# Patient Record
Sex: Female | Born: 1985
Health system: Southern US, Community
[De-identification: ages and names within clinical notes are randomized; demographics above are authoritative.]

## PROBLEM LIST (undated history)

## (undated) ENCOUNTER — Inpatient Hospital Stay (HOSPITAL_COMMUNITY): Payer: Self-pay

## (undated) DIAGNOSIS — R519 Headache, unspecified: Secondary | ICD-10-CM

## (undated) DIAGNOSIS — D649 Anemia, unspecified: Secondary | ICD-10-CM

## (undated) DIAGNOSIS — Z8759 Personal history of other complications of pregnancy, childbirth and the puerperium: Secondary | ICD-10-CM

## (undated) DIAGNOSIS — F32A Depression, unspecified: Secondary | ICD-10-CM

## (undated) DIAGNOSIS — E282 Polycystic ovarian syndrome: Secondary | ICD-10-CM

## (undated) DIAGNOSIS — F329 Major depressive disorder, single episode, unspecified: Secondary | ICD-10-CM

## (undated) DIAGNOSIS — Z8619 Personal history of other infectious and parasitic diseases: Secondary | ICD-10-CM

## (undated) DIAGNOSIS — N83209 Unspecified ovarian cyst, unspecified side: Secondary | ICD-10-CM

## (undated) DIAGNOSIS — Z9884 Bariatric surgery status: Secondary | ICD-10-CM

## (undated) DIAGNOSIS — Z973 Presence of spectacles and contact lenses: Secondary | ICD-10-CM

## (undated) DIAGNOSIS — F419 Anxiety disorder, unspecified: Secondary | ICD-10-CM

## (undated) HISTORY — DX: Polycystic ovarian syndrome: E28.2

## (undated) HISTORY — DX: Personal history of other infectious and parasitic diseases: Z86.19

---
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
## 2006-10-18 ENCOUNTER — Encounter: Payer: Self-pay | Admitting: Family Medicine

## 2006-10-18 ENCOUNTER — Ambulatory Visit: Payer: Self-pay | Admitting: Family Medicine

## 2006-10-18 LAB — CONVERTED CEMR LAB
Antibody Screen: NEGATIVE
Basophils Absolute: 0 10*3/uL (ref 0.0–0.1)
Basophils Relative: 0 % (ref 0–1)
Eosinophils Absolute: 0 10*3/uL (ref 0.0–0.7)
Eosinophils Relative: 1 % (ref 0–5)
HCT: 37.8 % (ref 36.0–46.0)
Hemoglobin: 12 g/dL (ref 12.0–15.0)
Hepatitis B Surface Ag: NEGATIVE
Lymphocytes Relative: 28 % (ref 12–46)
Lymphs Abs: 2.5 10*3/uL (ref 0.7–3.3)
MCHC: 31.7 g/dL (ref 30.0–36.0)
MCV: 83.8 fL (ref 78.0–100.0)
Monocytes Absolute: 0.5 10*3/uL (ref 0.2–0.7)
Monocytes Relative: 6 % (ref 3–11)
Neutro Abs: 5.7 10*3/uL (ref 1.7–7.7)
Neutrophils Relative %: 65 % (ref 43–77)
Platelets: 326 10*3/uL (ref 150–400)
RBC: 4.51 M/uL (ref 3.87–5.11)
RDW: 13.4 % (ref 11.5–14.0)
Rh Type: POSITIVE
Rubella: 90.3 intl units/mL — ABNORMAL HIGH
WBC: 8.8 10*3/uL (ref 4.0–10.5)

## 2006-10-21 ENCOUNTER — Inpatient Hospital Stay (HOSPITAL_COMMUNITY): Admission: AD | Admit: 2006-10-21 | Discharge: 2006-10-21 | Payer: Self-pay | Admitting: Gynecology

## 2006-10-23 ENCOUNTER — Inpatient Hospital Stay (HOSPITAL_COMMUNITY): Admission: AD | Admit: 2006-10-23 | Discharge: 2006-10-23 | Payer: Self-pay | Admitting: Obstetrics and Gynecology

## 2006-10-31 ENCOUNTER — Inpatient Hospital Stay (HOSPITAL_COMMUNITY): Admission: AD | Admit: 2006-10-31 | Discharge: 2006-10-31 | Payer: Self-pay | Admitting: Obstetrics & Gynecology

## 2006-11-01 ENCOUNTER — Inpatient Hospital Stay (HOSPITAL_COMMUNITY): Admission: AD | Admit: 2006-11-01 | Discharge: 2006-11-01 | Payer: Self-pay | Admitting: Obstetrics & Gynecology

## 2006-12-12 ENCOUNTER — Ambulatory Visit: Payer: Self-pay | Admitting: Obstetrics & Gynecology

## 2007-05-01 ENCOUNTER — Encounter: Payer: Self-pay | Admitting: Family Medicine

## 2007-05-16 ENCOUNTER — Emergency Department (HOSPITAL_COMMUNITY): Admission: EM | Admit: 2007-05-16 | Discharge: 2007-05-16 | Payer: Self-pay | Admitting: Emergency Medicine

## 2007-09-20 ENCOUNTER — Encounter (INDEPENDENT_AMBULATORY_CARE_PROVIDER_SITE_OTHER): Payer: Self-pay | Admitting: *Deleted

## 2007-09-20 ENCOUNTER — Ambulatory Visit: Payer: Self-pay | Admitting: Family Medicine

## 2007-09-20 ENCOUNTER — Other Ambulatory Visit: Admission: RE | Admit: 2007-09-20 | Discharge: 2007-09-20 | Payer: Self-pay | Admitting: *Deleted

## 2007-09-20 LAB — CONVERTED CEMR LAB
Beta hcg, urine, semiquantitative: NEGATIVE
Bilirubin Urine: NEGATIVE
Blood in Urine, dipstick: NEGATIVE
Glucose, Urine, Semiquant: NEGATIVE
Ketones, urine, test strip: NEGATIVE
Nitrite: NEGATIVE
Pap Smear: NORMAL
Protein, U semiquant: NEGATIVE
Specific Gravity, Urine: 1.02
Urobilinogen, UA: 0.2
WBC Urine, dipstick: NEGATIVE
pH: 8

## 2007-09-26 ENCOUNTER — Encounter (INDEPENDENT_AMBULATORY_CARE_PROVIDER_SITE_OTHER): Payer: Self-pay | Admitting: *Deleted

## 2007-09-26 LAB — CONVERTED CEMR LAB
Chlamydia, DNA Probe: NEGATIVE
FSH: 7.6 milliintl units/mL
GC Probe Amp, Genital: NEGATIVE
Prolactin: 12.1 ng/mL
TSH: 3.051 microintl units/mL (ref 0.350–5.50)
Testosterone: 52.34 ng/dL (ref 10–70)

## 2007-10-03 ENCOUNTER — Ambulatory Visit: Payer: Self-pay | Admitting: Family Medicine

## 2007-10-03 ENCOUNTER — Telehealth: Payer: Self-pay | Admitting: *Deleted

## 2007-10-03 LAB — CONVERTED CEMR LAB
Bilirubin Urine: NEGATIVE
Blood in Urine, dipstick: NEGATIVE
Glucose, Urine, Semiquant: NEGATIVE
Ketones, urine, test strip: NEGATIVE
Nitrite: NEGATIVE
Protein, U semiquant: NEGATIVE
Specific Gravity, Urine: 1.02
Urobilinogen, UA: 0.2
Whiff Test: NEGATIVE
pH: 7

## 2007-11-29 ENCOUNTER — Ambulatory Visit: Payer: Self-pay | Admitting: Family Medicine

## 2007-11-29 ENCOUNTER — Encounter (INDEPENDENT_AMBULATORY_CARE_PROVIDER_SITE_OTHER): Payer: Self-pay | Admitting: *Deleted

## 2007-11-29 LAB — CONVERTED CEMR LAB
Beta hcg, urine, semiquantitative: NEGATIVE
Bilirubin Urine: NEGATIVE
Blood in Urine, dipstick: NEGATIVE
Chlamydia, DNA Probe: NEGATIVE
GC Probe Amp, Genital: NEGATIVE
Glucose, Urine, Semiquant: NEGATIVE
Ketones, urine, test strip: NEGATIVE
Nitrite: NEGATIVE
Protein, U semiquant: NEGATIVE
Specific Gravity, Urine: 1.02
Urobilinogen, UA: 1
WBC Urine, dipstick: NEGATIVE
Whiff Test: POSITIVE
pH: 6.5

## 2007-12-02 ENCOUNTER — Encounter (INDEPENDENT_AMBULATORY_CARE_PROVIDER_SITE_OTHER): Payer: Self-pay | Admitting: *Deleted

## 2008-01-12 ENCOUNTER — Emergency Department (HOSPITAL_COMMUNITY): Admission: EM | Admit: 2008-01-12 | Discharge: 2008-01-12 | Payer: Self-pay | Admitting: Family Medicine

## 2008-02-03 ENCOUNTER — Emergency Department (HOSPITAL_COMMUNITY): Admission: EM | Admit: 2008-02-03 | Discharge: 2008-02-03 | Payer: Self-pay | Admitting: Emergency Medicine

## 2008-02-04 ENCOUNTER — Ambulatory Visit: Payer: Self-pay | Admitting: Family Medicine

## 2008-02-04 ENCOUNTER — Encounter (INDEPENDENT_AMBULATORY_CARE_PROVIDER_SITE_OTHER): Payer: Self-pay | Admitting: *Deleted

## 2008-02-04 LAB — CONVERTED CEMR LAB
ALT: 11 units/L (ref 0–35)
AST: 15 units/L (ref 0–37)
Albumin: 4.3 g/dL (ref 3.5–5.2)
Alkaline Phosphatase: 55 units/L (ref 39–117)
BUN: 10 mg/dL (ref 6–23)
Beta hcg, urine, semiquantitative: NEGATIVE
Bilirubin Urine: NEGATIVE
Blood in Urine, dipstick: NEGATIVE
CO2: 24 meq/L (ref 19–32)
Calcium: 9.6 mg/dL (ref 8.4–10.5)
Chlamydia, DNA Probe: NEGATIVE
Chloride: 104 meq/L (ref 96–112)
Creatinine, Ser: 0.9 mg/dL (ref 0.40–1.20)
GC Probe Amp, Genital: NEGATIVE
Glucose, Bld: 82 mg/dL (ref 70–99)
Glucose, Urine, Semiquant: NEGATIVE
HCT: 41.5 % (ref 36.0–46.0)
Hemoglobin: 12.5 g/dL (ref 12.0–15.0)
Ketones, urine, test strip: NEGATIVE
MCHC: 30.1 g/dL (ref 30.0–36.0)
MCV: 86.6 fL (ref 78.0–100.0)
Nitrite: NEGATIVE
Platelets: 306 10*3/uL (ref 150–400)
Potassium: 4.3 meq/L (ref 3.5–5.3)
RBC: 4.79 M/uL (ref 3.87–5.11)
RDW: 13.3 % (ref 11.5–15.5)
Sodium: 143 meq/L (ref 135–145)
Specific Gravity, Urine: 1.02
Total Bilirubin: 0.5 mg/dL (ref 0.3–1.2)
Total Protein: 7.8 g/dL (ref 6.0–8.3)
Urobilinogen, UA: 0.2
WBC Urine, dipstick: NEGATIVE
WBC: 7.9 10*3/uL (ref 4.0–10.5)
pH: 7

## 2008-02-05 ENCOUNTER — Telehealth (INDEPENDENT_AMBULATORY_CARE_PROVIDER_SITE_OTHER): Payer: Self-pay | Admitting: *Deleted

## 2008-02-06 ENCOUNTER — Encounter: Payer: Self-pay | Admitting: Family Medicine

## 2008-02-06 ENCOUNTER — Ambulatory Visit: Payer: Self-pay | Admitting: Family Medicine

## 2008-02-06 LAB — CONVERTED CEMR LAB: Tissue Transglutaminase Ab, IgA: 0.6 units (ref <7)

## 2008-02-07 ENCOUNTER — Emergency Department (HOSPITAL_COMMUNITY): Admission: EM | Admit: 2008-02-07 | Discharge: 2008-02-07 | Payer: Self-pay | Admitting: Emergency Medicine

## 2008-02-07 ENCOUNTER — Encounter: Admission: RE | Admit: 2008-02-07 | Discharge: 2008-02-07 | Payer: Self-pay | Admitting: Family Medicine

## 2008-02-10 ENCOUNTER — Telehealth: Payer: Self-pay | Admitting: *Deleted

## 2008-03-20 ENCOUNTER — Ambulatory Visit: Payer: Self-pay | Admitting: Family Medicine

## 2008-03-20 LAB — CONVERTED CEMR LAB
Beta hcg, urine, semiquantitative: NEGATIVE
Bilirubin Urine: NEGATIVE
Blood in Urine, dipstick: NEGATIVE
Glucose, Urine, Semiquant: NEGATIVE
Ketones, urine, test strip: NEGATIVE
Nitrite: NEGATIVE
Protein, U semiquant: NEGATIVE
Specific Gravity, Urine: 1.025
Urobilinogen, UA: 1
WBC Urine, dipstick: NEGATIVE
pH: 7

## 2008-05-12 ENCOUNTER — Emergency Department (HOSPITAL_COMMUNITY): Admission: EM | Admit: 2008-05-12 | Discharge: 2008-05-12 | Payer: Self-pay | Admitting: Family Medicine

## 2008-07-28 ENCOUNTER — Emergency Department (HOSPITAL_COMMUNITY): Admission: EM | Admit: 2008-07-28 | Discharge: 2008-07-28 | Payer: Self-pay | Admitting: Family Medicine

## 2008-11-04 ENCOUNTER — Ambulatory Visit: Payer: Self-pay | Admitting: Family Medicine

## 2008-11-04 LAB — CONVERTED CEMR LAB: Beta hcg, urine, semiquantitative: NEGATIVE

## 2009-01-07 ENCOUNTER — Telehealth: Payer: Self-pay | Admitting: Family Medicine

## 2009-01-08 ENCOUNTER — Ambulatory Visit: Payer: Self-pay | Admitting: Family Medicine

## 2009-01-08 ENCOUNTER — Encounter: Payer: Self-pay | Admitting: Family Medicine

## 2009-01-08 LAB — CONVERTED CEMR LAB
Beta hcg, urine, semiquantitative: NEGATIVE
Hemoglobin: 12.5 g/dL

## 2009-01-11 ENCOUNTER — Encounter: Payer: Self-pay | Admitting: *Deleted

## 2009-01-11 LAB — CONVERTED CEMR LAB: TSH: 2.06 microintl units/mL (ref 0.350–4.500)

## 2009-05-29 ENCOUNTER — Encounter (INDEPENDENT_AMBULATORY_CARE_PROVIDER_SITE_OTHER): Payer: Self-pay | Admitting: *Deleted

## 2009-05-29 DIAGNOSIS — F17200 Nicotine dependence, unspecified, uncomplicated: Secondary | ICD-10-CM | POA: Insufficient documentation

## 2009-09-24 ENCOUNTER — Encounter: Payer: Self-pay | Admitting: Family Medicine

## 2009-09-24 ENCOUNTER — Ambulatory Visit: Payer: Self-pay | Admitting: Family Medicine

## 2009-09-24 DIAGNOSIS — E282 Polycystic ovarian syndrome: Secondary | ICD-10-CM | POA: Insufficient documentation

## 2009-09-24 LAB — CONVERTED CEMR LAB
ALT: 13 units/L (ref 0–35)
AST: 17 units/L (ref 0–37)
Albumin: 4.1 g/dL (ref 3.5–5.2)
Alkaline Phosphatase: 64 units/L (ref 39–117)
BUN: 11 mg/dL (ref 6–23)
Beta hcg, urine, semiquantitative: NEGATIVE
CO2: 22 meq/L (ref 19–32)
Calcium: 8.8 mg/dL (ref 8.4–10.5)
Chloride: 104 meq/L (ref 96–112)
Cholesterol: 150 mg/dL (ref 0–200)
Creatinine, Ser: 0.88 mg/dL (ref 0.40–1.20)
Glucose, Bld: 91 mg/dL (ref 70–99)
HDL: 45 mg/dL (ref >39)
LDL Cholesterol: 94 mg/dL (ref 0–99)
Potassium: 4.1 meq/L (ref 3.5–5.3)
Sodium: 139 meq/L (ref 135–145)
Total Bilirubin: 0.5 mg/dL (ref 0.3–1.2)
Total CHOL/HDL Ratio: 3.3
Total Protein: 7.3 g/dL (ref 6.0–8.3)
Triglycerides: 54 mg/dL (ref <150)
VLDL: 11 mg/dL (ref 0–40)

## 2009-09-28 ENCOUNTER — Encounter: Payer: Self-pay | Admitting: Family Medicine

## 2009-11-18 ENCOUNTER — Ambulatory Visit: Payer: Self-pay | Admitting: Family Medicine

## 2009-11-18 LAB — CONVERTED CEMR LAB: Pap Smear: NEGATIVE

## 2009-11-25 ENCOUNTER — Encounter: Payer: Self-pay | Admitting: Family Medicine

## 2009-12-23 ENCOUNTER — Ambulatory Visit: Payer: Self-pay | Admitting: Family Medicine

## 2009-12-23 LAB — CONVERTED CEMR LAB: Beta hcg, urine, semiquantitative: NEGATIVE

## 2009-12-27 ENCOUNTER — Encounter: Payer: Self-pay | Admitting: Family Medicine

## 2010-07-14 ENCOUNTER — Encounter: Payer: Self-pay | Admitting: Family Medicine

## 2010-09-30 ENCOUNTER — Ambulatory Visit: Admission: RE | Admit: 2010-09-30 | Discharge: 2010-09-30 | Payer: Self-pay | Source: Home / Self Care

## 2010-09-30 DIAGNOSIS — J309 Allergic rhinitis, unspecified: Secondary | ICD-10-CM | POA: Insufficient documentation

## 2010-09-30 DIAGNOSIS — N926 Irregular menstruation, unspecified: Secondary | ICD-10-CM | POA: Insufficient documentation

## 2010-09-30 LAB — CONVERTED CEMR LAB: Beta hcg, urine, semiquantitative: NEGATIVE

## 2010-10-11 NOTE — Progress Notes (Signed)
Summary: Triage  Phone Note Call from Patient Call back at Home Phone [phone]   Reason for Call: Talk to Nurse Summary of Call: Pt needs to be seen to follow up from hospital visit, but wants to discuss with a nurse. Initial call taken by: Haydee Salter,  February 10, 2008 12:03 PM  Follow-up for Phone Call        left message Follow-up by: Golden Circle RN,  February 10, 2008 12:05 PM  Additional Follow-up for Phone Call Additional follow up Details #1::        hosp for stomach pain. CAT scan was normal. tired of multiple visits. wants referral. appt made with pcp tomorrow Additional Follow-up by: Golden Circle RN,  February 10, 2008 1:58 PM

## 2010-10-11 NOTE — Progress Notes (Signed)
Summary: Triage  Phone Note Call from Patient Call back at [phone]   Summary of Call: Pt is concerned about her vaginal area being swollen. Initial call taken by: Haydee Salter,  October 03, 2007 10:09 AM  Follow-up for Phone Call        started yesterday. swollen, red, itchy, "feels bruised"  Denies recent sex. appt made for today  Follow-up by: Golden Circle RN,  October 03, 2007 10:13 AM

## 2010-10-11 NOTE — Assessment & Plan Note (Signed)
Summary: cpe,df   Vital Signs:  Patient profile:   25 year old female Height:      66.5 inches Weight:      270 pounds BMI:     43.08 Temp:     98.2 degrees F oral Pulse rate:   79 / minute BP sitting:   126 / 79  (left arm) Cuff size:   large  Vitals Entered By: Tessie Fass CMA (November 18, 2009 4:08 PM) CC: complete physical with pap Is Patient Diabetic? No Pain Assessment Patient in pain? no        Primary Care Provider:  Ardeen Garland  MD  CC:  complete physical with pap.  History of Present Illness: Crystal [name] is here for complete physical today.  ONly complaint is PNVs make her nauseated.  AT last visit started on Metformin for PCOS and desire to get pregnant.  Was having irregular periods.  Wanted to go off OCPs since she wanted to become pregnant.  Toelrating the metformin well. LMP 10-16-09.  Has not yet started this month.  No other complaints. Denies vaginal discharge, abdominal pain, headache, difficulty breathing, chest pain.   Habits & Providers  Alcohol-Tobacco-Diet     Tobacco Status: quit  Allergies: No Known Drug Allergies  Past History:  Past Medical History: Last updated: 11/08/2006 RX for plan B given  Family History: Last updated: 02/06/2008 CAD, HTN, DM, Asthma No hx of gastric, colon, or gyn cancers  Social History: Last updated: 11/08/2006 parents are not in the picture; pt lives with her pastor since 6/02; sister with a child who lives with grandmother; grandmother is only family; enjoys hanging out with friends; no cigs, etoh, drug use; wil be freshman at [employer]; sexually active; Father lives in [place]- lived with until last year.  Mother not in the picture.  Social History: Smoking Status:  quit  Physical Exam  General:  obese, alert, NAD vitals reviewed.  Eyes:  conjunctiva clear, no injection Ears:  bilateral TMs normal Mouth:  oropharynx clear and moist Neck:  No deformities, masses, or tenderness noted. Lungs:  Normal  respiratory effort, chest expands symmetrically. Lungs are clear to auscultation, no crackles or wheezes. Heart:  Normal rate and regular rhythm. S1 and S2 normal without gallop, murmur, click, rub or other extra sounds. Abdomen:  Bowel sounds positive,abdomen soft and non-tender without masses, organomegaly or hernias noted. Genitalia:  Normal introitus for age, no external lesions, no vaginal discharge, mucosa pink and moist, no vaginal or cervical lesions, no vaginal atrophy, no friaility or hemorrhage, normal uterus size and position, unable to palpate adnexae secondary to habitus Pulses:  2+ radial and dp pulses Extremities:  no edema Skin:  no lesions Cervical Nodes:  No lymphadenopathy noted   Impression & Recommendations:  Problem # 1:  ROUTINE GYNECOLOGICAL EXAMINATION (ICD-V72.31) Assessment New  Pap obtained.  Exam otherwise normal.  Return in 1 year  Orders: [facility] - Est  18-39 yrs ([address])  Problem # 2:  POLYCYSTIC OVARIAN DISEASE (ICD-256.4) Assessment: Unchanged Toelrating metformin.   Complete Medication List: 1)  Metformin Hcl 500 Mg Tabs (Metformin hcl) .Marland Kitchen.. 1 tab by mouth with dinner for 5 days, then 1 tab with lunch and dinner for 5 days and then 1 tab with three times a day with meals 2)  Prenatal Vitamins 0.8 Mg Tabs (Prenatal multivit-min-fe-fa) .Marland Kitchen.. 1 tab by mouth daily  Other Orders: Pap Smear-[facility] ([id number])  Patient Instructions: 1)  everything looked great on your exam today.  I  will send you a letter with the results of your pap smear when it comes back. 2)  Try the flintstones vitamins instead of the prenatals - you can take 2 daily for the equivalent of a prenatal vitamin.  3)  All the labwork at your last visit looked excellent. 4)  Please return in 1 year for your next pap smear.  5)  If when you have gone 6-8 weeks without a period, take a pregnancy test.  If the metformin doesn't end up regulating your periods after another 3-4 months or you become  unable to tolerate it or you want to go back on birth control, then please come back in.  If you go 6 months without a period and you are not pregnant, please return.

## 2010-10-11 NOTE — Assessment & Plan Note (Signed)
Summary: prolonged bleeding   Vital Signs:  Patient profile:   25 year old female Weight:      273.9 pounds Temp:     98 degrees F oral Pulse rate:   61 / minute Pulse rhythm:   regular BP sitting:   125 / 79  (left arm) Cuff size:   large  Vitals Entered By: Loralee Pacas CMA (December 23, 2009 10:56 AM)  Primary Care Provider:  Ardeen Garland  MD   History of Present Illness: 25 yo with h/o PCOS reports LMP started 11 days ago.  Has days that are like a regular menses, changes pad q 3-4 hours and days with no bleeding except when she uses the bathroom.  No menses in March, but had one in Feb.  Denies SOB and dizziness. Trying to get pregnant.  On metformin - would like to minimize the the number of times she takes it in a day .  She is exercising and trying to lose weight.  Has not started her multivitamins, but knows children's vitamins are an option.    Allergies: No Known Drug Allergies  Review of Systems       seee HPI  Physical Exam  General:  Obese,in no acute distress; alert,appropriate and cooperative throughout examination Genitalia:  Normal introitus for age, no external lesions, no vaginal discharge, mucosa pink and moist, no vaginal or cervical lesions, no vaginal atrophy.  +scant amount of blood in vault.,   Impression & Recommendations:  Problem # 1:  POLYCYSTIC OVARIAN DISEASE (ICD-256.4)  DUB likely due to PCOS. Upreg neg.  Will try a short course of progesterone to stop bleeding. May need a longer course, but pt currently trying to get pregnant, so we will try short course.  Also may change metforming to daily dosing of XR med since she is doing well at her current dose.  Advised weight loss with nutrition and exercise.  Pt to follow up with any concerns.  Orders: [facility]- Est Level  3 ([address])  Complete Medication List: 1)  Medroxyprogesterone Acetate 10 Mg Tabs (Medroxyprogesterone acetate) .... Take 1 by mouth daily for 5 days. 2)  Glumetza 1000 Mg Xr24h-tab  (Metformin hcl) .... Take 2 at night with dinner.  Other Orders: U Preg-[facility] ([address])  Patient Instructions: 1)  You can take the metformin at night.  Take 2 pills with dinner. 2)  You may have more bleeding again after you stop the progesterone, but it shouldn't last as long. 3)  If you don't want to take the children's multivitamin, you can take 400 micrograms of folic acid every day. 4)  Good luck with the weight loss.  I think it will make a big difference! Prescriptions: GLUMETZA 1000 MG XR24H-TAB (METFORMIN HCL) Take 2 at night with dinner.  #60 x 3   Entered and Authorized by:   Lasundra Hascall [place] MD   Signed by:   Carvel Huskins [place] MD on 12/23/2009   Method used:   Electronically to        [facility] (retail)       [address]       [place], [place]  [address]       Ph: [phone]       Fax: [phone]   RxID:   [id number] MEDROXYPROGESTERONE ACETATE 10 MG TABS (MEDROXYPROGESTERONE ACETATE) Take 1 by mouth daily for 5 days.  #5 x 0   Entered and Authorized by:  Jeorgia Helming [place] MD   Signed by:   Trusten Hume [place] MD on 12/23/2009   Method used:   Electronically to        [facility] (retail)       [address]       [place], [place]  [address]       Ph: [phone]       Fax: [phone]   RxID:   [id number]   Laboratory Results   Urine Tests  Date/Time Received: December 23, 2009 11:00 AM  Date/Time Reported: December 23, 2009 11:06 AM     Urine HCG: negative Comments: ...........test performed by...........Marland KitchenTerese Door, CMA

## 2010-10-11 NOTE — Letter (Signed)
Summary: Generic Letter  [facility]  [address]   [place], [place] [address]   Phone: [phone]  Fax: [phone]    01/11/2009  [facility] [address] [place], [place]  [address]  Dear Ms. Ivette Loyal,   Your thyroid test was normal.        Sincerely,   Arlyss Repress CMA,

## 2010-10-11 NOTE — Letter (Signed)
Summary: Generic Letter  [facility]  [address]   [place], [place] [address]   Phone: [phone]  Fax: [phone]    11/25/2009  [facility] [address] [place], [place]  [address]  Dear [name],  I am happy to inform you that your recent pap smear was normal.  If you have any questions, please call our office.          Sincerely,   Ardeen Garland  MD  Appended Document: Generic Letter mailed.

## 2010-10-11 NOTE — Assessment & Plan Note (Signed)
Summary: menstrual bleeding since 2/10   Vital Signs:  Patient profile:   25 year old female Height:      66.5 inches Weight:      275 pounds BMI:     43.88 Pulse rate:   72 / minute BP sitting:   123 / 79  (left arm)  Vitals Entered By: Arlyss Repress CMA, (January 08, 2009 9:55 AM) CC: menses since february   History of Present Illness: 25 yo F with suspected PCOS presents with menses since 2/9.  Patient had irregular periods leading up to this time.  She then started spotting on 2/9 and it has been intermittently heavier and lighter but has not completely stopped.  Denies dizziness, shortness of breath, other complaints related to anemia.  No FH thyroid disease.  No nipple discharge.  Last sexual intercourse 2 weeks ago.  She has not been taking birth control pills - she did not pick them up and stated cost was not a reason for not picking them up.  + facial hair.  No DM - last fasting glucose normal 1 year ago.  Not trying to get pregnant.  Allergies (verified): No Known Drug Allergies  Physical Exam  General:  obese, alert, well-hydrated in NAD, very pleasant.  Abdomen:  obese, soft, non-distended, no masses, nt.   Impression & Recommendations:  Problem # 1:  OLIGOMENORRHEA (ICD-626.1) Assessment Deteriorated Hb normal today, UPT negative.  Agree with Dr. Georgiana Shore that this most likely constitutes PCOS.  Reaffirmed need for patient to be on birth control pills to regulate her periods.  Would consider trial of metformin as well.  Checking TSH to assess for thyroid disease as a possible secondary cause.  Had a CT a year ago which did not show any pelvic abnormalities to account for increased bleeding - doubt ultrasound would change management or provide further information at this point.  Weight loss would be beneficial.  Rx iron pills while still bleeding, sprintec two times a day until bleeding stops then daily.  Can discuss with Dr. Georgiana Shore long term options at f/u in 2  weeks.  Orders: Hemoglobin-[facility] ([address]) U Preg-[facility] ([address]) TSH-[facility] ([id number]) [facility]- Est Level  3 ([address])  Complete Medication List: 1)  Sprintec 28 0.25-35 Mg-mcg Tabs (Norgestimate-eth estradiol) .Marland Kitchen.. 1 tab by mouth two times a day until bleeding stops then daily 2)  Bentyl 20 Mg Tabs (Dicyclomine hcl) .Marland Kitchen.. 1 tab by mouth three times a day. 3)  Ferrous Sulfate 325 (65 Fe) Mg Tabs (Ferrous sulfate) .Marland Kitchen.. 1 tab by mouth two times a day  Patient Instructions: 1)  Take sprintec twice a day until bleeding stops then once a day. 2)  Take iron pills twice a day as well.  Your blood count is ok now. 3)  We will contact you with your lab result. 4)  Follow up with your doctor in 2 weeks to see how you are doing. Prescriptions: FERROUS SULFATE 325 (65 FE) MG TABS (FERROUS SULFATE) 1 tab by mouth two times a day  #60 x 1   Entered and Authorized by:   Norton Blizzard MD   Signed by:   Norton Blizzard MD on 01/08/2009   Method used:   Print then Give to Patient   RxID:   [id number] SPRINTEC 28 0.25-35 MG-MCG  TABS (NORGESTIMATE-ETH ESTRADIOL) 1 tab by mouth two times a day until bleeding stops then daily  #1 pack x 3   Entered and Authorized by:   Norton Blizzard MD  Signed by:   Norton Blizzard MD on 01/08/2009   Method used:   Print then Give to Patient   RxID:   [id number]   Laboratory Results   Urine Tests  Date/Time Received: January 08, 2009 10:18 AM  Date/Time Reported: January 08, 2009 10:21 AM     Urine HCG: negative Comments: ...............test performed by......Marland KitchenBonnie A. [place], [place] (ASCP)   Blood Tests   Date/Time Received: January 08, 2009 10:02 AM  Date/Time Reported: January 08, 2009 10:02 AM     CBC   HGB:  12.5 g/dL   (Normal Range: 01.0-93.2 in Males, 12.0-15.0 in Females) Comments: ...........test performed by...........Marland KitchenTerese Door, CMA

## 2010-10-11 NOTE — Assessment & Plan Note (Signed)
Summary: f/u per simons/   dpg   Vital Signs:  Patient Profile:   25 Years Old Female Height:     66.5 inches Weight:      259.4 pounds Temp:     98.8 degrees F Pulse rate:   83 / minute BP sitting:   125 / 78  (left arm)  Pt. in pain?   no  Vitals Entered By: Alphia Kava (Feb 06, 2008 9:50 AM)              Is Patient Diabetic? No     PCP:  Neena Rhymes  MD  Chief Complaint:  f/u.  History of Present Illness: 25 yo woman who has seen multiple providers recently for 3+ months of abdominal pain.  Pt states pain always starts on L side (flank) but then 'travels all over'.  Pain is intermittant but occurs daily for at least last 3 months.  Not related to menses, BMs, eating.  Has stool cards at home but has not completed them.  States that for 1st 2 months she had diarrhea, now having regular BMs.  Pt was placed on OCPs for irregular periods, which regulated menses, but did not take them last month b/c 'I didn't have any money'.  Pt had abdominal films which were WNL.  Has not had gyn w/u or [place], nor a CT scan.  Pain does not improve w/ anything and worsens w/ 'touch'.  UA, GC/CT, CMP, CBC from last time WNL.  Pt currently taking Amox (reason unclear) and Tramadol- which makes her sleepy but does nothing to help the pain.     Family History:    CAD, HTN, DM, Asthma    No hx of gastric, colon, or gyn cancers   Risk Factors:     Counseled to quit/cut down tobacco use:  yes   Review of Systems      See HPI   Physical Exam  General:     Well-developed,well-nourished,in no acute distress; alert,appropriate and cooperative throughout examination Lungs:     Normal respiratory effort, chest expands symmetrically. Lungs are clear to auscultation, no crackles or wheezes. Heart:     Normal rate and regular rhythm. S1 and S2 normal without gallop, murmur, click, rub or other extra sounds. Abdomen:     Soft, obese, mild diffuse abdominal pain to palpation without rebound  tenderness or guarding.  Normal bowel sounds in all quadrants.  No masses palpated    Impression & Recommendations:  Problem # 1:  ABDOMINAL PAIN, UNSPECIFIED SITE (ICD-789.00) Assessment: Unchanged Pt's pain from unclear etiology.  Pain more abdominal than pelvic.  Could be renal. As pt has had pain for many months w/ normal labs and xrays, will get contrast CT to hopefully provide direction as to where w/u will go next- GI vs Renal vs Gyn.  Will also check anti endomesial abs and anti gliandin abs to r/o celiac dz.  Will check ESR to assess for possible UC or Crohn's.  If CT is WNL will likely refer to GI for their assistance.  In the mean time, will try pt on increased fiber, water, and Dicyclomine for possible spasm.  Pt expresses understanding and is in agreement w/ this plan. Orders: Sed Rate (ESR)-[facility] [id number]) CT with Contrast (CT w/ contrast) Miscellaneous Lab Charge-[facility] ([address]) [facility]- Est Level  3 ([address])   Complete Medication List: 1)  Sprintec 28 0.25-35 Mg-mcg Tabs (Norgestimate-eth estradiol) .... One tab by mouth daily 2)  Diflucan 100 Mg Tabs (Fluconazole) .Marland KitchenMarland KitchenMarland Kitchen  2 on day 1, then 1 by mouth once daily day2-5 3)  Clindamycin Hcl 300 Mg Caps (Clindamycin hcl) .... One tab by mouth bid 4)  Tramadol Hcl 50 Mg Tabs (Tramadol hcl) .... One tablt by mouth every eight hours as needed for pain 5)  Metronidazole 500 Mg Tabs (Metronidazole) .... One tablet by mouth twice daily for seven days 6)  Bentyl 20 Mg Tabs (Dicyclomine hcl) .Marland Kitchen.. 1 tab by mouth three times a day.   Patient Instructions: 1)  Please schedule a follow-up appointment in 2-3 weeks. 2)  Take Metamucil or another fiber supplement daily 3)  Stop the Amoxicillin 4)  Increase your water intake 5)  Take the Dicyclomine 3x/day for abdominal pain 6)  Take tylenol as needed for pain 7)  I will call you with your CT results   Prescriptions: BENTYL 20 MG  TABS (DICYCLOMINE HCL) 1 tab by mouth three times a day.  #90 x 0    Entered and Authorized by:   Neena Rhymes  MD   Signed by:   Neena Rhymes  MD on 02/06/2008   Method used:   Electronically sent to ...       [facility]       [address]       [place], [place]  [address]       Ph: [phone]       Fax: [phone]   RxID:   [id number]  ]  Laboratory Results   Blood Tests   Date/Time Received: Feb 06, 2008 10:48 AM  Date/Time Reported: Feb 06, 2008 12:16 PM   SED rate: 30 mm/hr  Comments: ...........test performed by...........Marland KitchenTerese Door, CMA

## 2010-10-11 NOTE — Assessment & Plan Note (Signed)
Summary: abdominal pain wp   Vital Signs:  Patient Profile:   25 Years Old Female Height:     66.5 inches Weight:      256.1 pounds Temp:     99.2 degrees F Pulse rate:   74 / minute BP sitting:   125 / 83  (left arm)  Pt. in pain?   yes    Location:   abdomen    Intensity:   8    Type:       pressure  Vitals Entered ByJacki Cones RN (Feb 04, 2008 10:50 AM)                  Visit Type:  Acute visit PCP:  Neena Rhymes  MD  Chief Complaint:  abdominal and back pain x3 months.  History of Present Illness:    This is a 25 year old presenting as a follow up from a recent urgent care visit for abdominal pain.  She states she had an x-ray at her urgent care visit that showed showed a normal gas pattern.  She states she was a prescribed a pain medication which she cannot remember the name.  She states she has had abdominal and back pain for the past three months.  She describes it as "my stomach knotting up".  She states it is all over the abdomen and is not localized to one area.  She states it can change on location at different times.  She does not not any aggravating factors.  She states eating does not make it worse.  She states the pain is an 8/8.  She denies current back pain, but states her lower back pain intermittently.         She also notes irregularities in her bowel movements.  She states for two months after the pain started she had diarrhea.  She know has had constipation for the past month with bowel movements every 3-4 days.  She also notes bowel movements with bright red blood mixed into the stool.  She denies melena.         She notes that her periods have been irregular.  She is not currently on birth control.         She denies fever, vaginal discharge, dysuria.       Updated Prior Medication List: SPRINTEC 28 0.25-35 MG-MCG  TABS (NORGESTIMATE-ETH ESTRADIOL) One tab by mouth daily DIFLUCAN 100 MG TABS (FLUCONAZOLE) 2 on Day 1, then 1 by mouth once daily  Day2-5 CLINDAMYCIN HCL 300 MG  CAPS (CLINDAMYCIN HCL) One tab by mouth BID TRAMADOL HCL 50 MG  TABS (TRAMADOL HCL) One tablt by mouth every eight hours as needed for pain       Review of Systems  General      Denies chills, fatigue, fever, and malaise.  Resp      Denies chest discomfort and cough.  GI      Complains of abdominal pain, bloody stools, change in bowel habits, constipation, gas, and loss of appetite.      Denies dark tarry stools, diarrhea, hemorrhoids, indigestion, nausea, vomiting, and vomiting blood.  GU      Complains of abnormal vaginal bleeding.      Denies discharge, dysuria, and hematuria.  Derm      Denies rash.   Physical Exam  General:     Well-developed,well-nourished,in no acute distress; alert,appropriate and cooperative throughout examination Head:     Normocephalic and atraumatic without obvious abnormalities. No  apparent alopecia or balding. Lungs:     Normal respiratory effort, chest expands symmetrically. Lungs are clear to auscultation, no crackles or wheezes. Heart:     Normal rate and regular rhythm. S1 and S2 normal without gallop, murmur, click, rub or other extra sounds. Abdomen:     Soft, obese, mild diffuse abdominal pain to palpation without rebound tenderness or guarding.  Normal bowel sounds in all quadrants.  No masses palpated Rectal:     Normal rectal tone.  No mass palpated.  Stool in rectum.  Sample taken.  Hemoccult negative. Genitalia:     Normal female external genitalia.  Vaginal mucosa is pink and moist with whitich thin discharge.  Cervix midline with yellowish clear discharge.  Uterus feels normal in size though difficult to evaluate seondary to body habitus.  Cervical motion tenderness in all directions.  Adnexa difficult to evaluate secondary to body habitus but no obvious mass palpable.   Psych:     Cognition and judgment appear intact. Alert and cooperative with normal attention span and concentration.  Tearful  at times.    Impression & Recommendations:  Problem # 1:  ABDOMINAL PAIN, UNSPECIFIED SITE (ICD-789.00)     Patient with three months of abdominal pain and changes in her bowel movements.  She has seen multiple providers including urgent care providers with no apparent cause known by patient.  Multiple sources could be cause including gastronintestinal, uterine, ovarian, renal/urinary.  UA looks good today.  Pregnancy test negative.  Will evaluate with CBC, CMP, GC/Chlam.  This will be her third GC/Chlam test the previous two which were negative.   She needs close follow-up and continuity of care to help extablish a source of her symptoms.   Orders: U Preg-[facility] [phone]) Urinalysis-[facility] (00000) GC/Chlamydia-[facility] (87591/87491) Comp Met-[facility] [phone]) CBC-[facility] (28413)   Problem # 2:  VAGINAL DISCHARGE (ICD-623.5)   Wet prep performed showing evidence of BV.  Will treat with seven days of flagyl.  Pt left prior to result.  Will have RN call patient with prescription.   [facility] (87591[facility]87491[facility]   Complete Medication List: 1)  Sprintec 28 0.25-35 Mg-mcg Tabs (Norgestimate-eth estradiol) .... One tab by mouth daily 2)  Diflucan 100 Mg Tabs (Fluconazole) .... 2 on day 1, then 1 by mouth once daily day2-5 3)  Clindamycin Hcl 300 Mg Caps (Clindamycin hcl) .... One tab by mouth bid 4)  Tramadol Hcl 50 Mg Tabs (Tramadol hcl) .... One tablt by mouth every eight hours as needed for pain   Patient Instructions: 1)  Follow up with Dr Beverely Low on Thursday 02/06/2008. 2)  The results of the tests perfromed today will be discussed at that time.  3)  Collect stool cards and bring them with you on Thursday.     Prescriptions: TRAMADOL HCL 50 MG  TABS (TRAMADOL HCL) One tablt by mouth every eight hours as needed for pain  #15 x 0   Entered and Authorized by:   Karlton Lemon MD   Signed by:   Karlton Lemon MD on 02/04/2008   Method used:   Print then Give to Patient    RxID:   [id number]  ] Laboratory Results   Urine Tests  Date/Time Received: Feb 04, 2008 11:05 AM  Date/Time Reported: Feb 04, 2008 11:12 AM     Routine Urinalysis   Color: yellow Appearance: Clear Glucose: negative   (Normal Range: Negative) Bilirubin: negative   (Normal Range: Negative) Ketone: negative   (Normal Range:  Negative) Spec. Gravity: 1.020   (Normal Range: 1.003-1.035) Blood: negative   (Normal Range: Negative) pH: 7.0   (Normal Range: 5.0-8.0) Protein: trace   (Normal Range: Negative) Urobilinogen: 0.2   (Normal Range: 0-1) Nitrite: negative   (Normal Range: Negative) Leukocyte Esterace: negative   (Normal Range: Negative)    Urine HCG: negative Comments: ...........test performed by...........Marland KitchenTerese Door, CMA  Date/Time Received: Feb 04, 2008 11:35 AM  Date/Time Reported: Feb 04, 2008 11:50 AM   [employer] Source: vaginal WBC/hpf: occ Bacteria/hpf: 3+  Cocci Clue cells/hpf: moderate Yeast/hpf: none Trichomonas/hpf: none Comments: test performed by Terese Door, CMA

## 2010-10-11 NOTE — Progress Notes (Signed)
Summary: Prescription for BV  Phone Note Outgoing Call Call back at Home Phone [phone]   Call placed by: Leontine Locket MD Call placed to: Patient Action Taken: Phone Call Completed Summary of Call: Called to inform patient of lab results and wet prep showing evidence of bacterial vaginosis.  She wishes to be treated treated.  Will call prescription to [facility] on [address].  Patient to follow Dr Beverely Low tomrrow am.     New/Updated Medications: METRONIDAZOLE 500 MG  TABS (METRONIDAZOLE) One tablet by mouth twice daily for seven days   Prescriptions: METRONIDAZOLE 500 MG  TABS (METRONIDAZOLE) One tablet by mouth twice daily for seven days  #14 x 0   Entered and Authorized by:   Karlton Lemon MD   Signed by:   Karlton Lemon MD on 02/05/2008   Method used:   Telephoned to ...       [facility] [address] [address]       [address]       [place], [place]  [address]       Ph: [phone]       Fax: [phone]   RxID:   [id number]

## 2010-10-11 NOTE — Assessment & Plan Note (Signed)
Summary: pt cancelled    History of Present Illness: Pt arrived to cancel appt

## 2010-10-11 NOTE — Assessment & Plan Note (Signed)
Summary: irreg periods,df   Vital Signs:  Patient Profile:   25 Years Old Female Height:     66.5 inches Weight:      271.4 pounds BMI:     43.30 Temp:     98.1 degrees F oral Pulse rate:   68 / minute BP sitting:   126 / 81  (left arm)  Vitals Entered By: Modesta Messing LPN (November 04, 2008 9:32 AM)                 PCP:  Ardeen Garland  MD  Chief Complaint:  Irregular periods since 2005.   Had not been getting BCP due to cost.  Has been sexually active.Marland Kitchen  History of Present Illness: Crystal [name] comes in today to discuss irregular menses.  She believes it all started after stopping Seasonale birth control pills in 2005 or 2006.  She states prior to that she was having regular periods.  She was on birth control pills again briefly in early 2009 though cannot recall exactly how long she took them or when she stopped.  States she didn't take them every month.  Since stopping the pills (she thinks in May) she has only had 2 normal menses - September and December.  This months she has been having very light spotting off and on for 3 weeks.  She would like to know what is causing that and what to do about it. She is sexually active and does not use protection.  She was pregnant in 2008 but miscarried.  She denies problems with acne but does endorse unwanted facial hair.  She has gain over 10 pounds in the last 6 months and is morbidly obese.         Physical Exam  General:     obese, alert, well-hydrated in NAD, very pleasant.  Head:     some mild hirsutism along jaw line. Abdomen:     obese, soft, non-distended, no masses    Impression & Recommendations:  Problem # 1:  OLIGOMENORRHEA (ICD-626.1) Assessment: New Discussed possiblity of patient having PCOS.  Does have oligomenorrhea and mild hirsutism, which would clinically meet criteria for PCOS.  Discussed  you can get ultrasound of ovaries, but would not change management at this point.  Discussed what PCOS is, how menses are  affected, and relationship to obesity.  Discussed need to have at least 4 periods a year to protect again endometrial cancer.  She does not desire to get pregnant, so patient will resume sprintec contraceptive pills to induce menses and protect against pregnancy.  Advised weight loss.  Fasting blood glucose 82 in May 2009.  Blood pressure good.   Orders: [facility]- Est Level  3 ([address])   Complete Medication List: 1)  Sprintec 28 0.25-35 Mg-mcg Tabs (Norgestimate-eth estradiol) .... One tab by mouth daily.   dispense 3 packs 2)  Diflucan 100 Mg Tabs (Fluconazole) .... 2 on day 1, then 1 by mouth once daily day2-5 3)  Clindamycin Hcl 300 Mg Caps (Clindamycin hcl) .... One tab by mouth bid 4)  Tramadol Hcl 50 Mg Tabs (Tramadol hcl) .... One tablt by mouth every eight hours as needed for pain 5)  Metronidazole 500 Mg Tabs (Metronidazole) .... One tablet by mouth twice daily for seven days 6)  Bentyl 20 Mg Tabs (Dicyclomine hcl) .Marland Kitchen.. 1 tab by mouth three times a day. 7)  Yaz 3-0.02 Mg Tabs (Drospirenone-ethinyl estradiol) .Marland Kitchen.. 1 by mouth daily as directed  Other Orders: U Preg-[facility] (60454)  Patient Instructions: 1)  You may start your birth control pills this week. 2)  Birth control pills do not protect you from sexually transmitted diseases, so you should still use condoms to protect against these diseases.   Prescriptions: SPRINTEC 28 0.25-35 MG-MCG  TABS (NORGESTIMATE-ETH ESTRADIOL) One tab by mouth daily.   dispense 3 packs  #3 x 4   Entered and Authorized by:   Ardeen Garland  MD   Signed by:   Ardeen Garland  MD on 11/04/2008   Method used:   Print then Give to Patient   RxID:   [id number]   Laboratory Results   Urine Tests  Date/Time Received: November 04, 2008 10:13 AM  Date/Time Reported: November 04, 2008 10:20 AM     Urine HCG: negative Comments: ...............test performed by......Marland KitchenBonnie A. [place], [place] (ASCP)

## 2010-10-11 NOTE — Assessment & Plan Note (Signed)
Summary: stomach problems/eo   Vital Signs:  Patient Profile:   25 Years Old Female Height:     66.5 inches Weight:      260.5 pounds BMI:     41.57 Temp:     97.8 degrees F oral Pulse rate:   70 / minute BP sitting:   115 / 60  (left arm) Cuff size:   large  Pt. in pain?   yes    Location:   stomach    Intensity:   4  Vitals Entered By: Garen Grams LPN (March 20, 2008 9:45 AM)                  PCP:  Neena Rhymes  MD  Chief Complaint:  Stomach problems and no menstrual cycle since April.  History of Present Illness:    This is a 25 year old presenting as a follow up  for stoumach pain.  Has been evaulated multiple times.  A little better now.  Many labs all normal, CT normal.  No fever, no recent constipation or diarrhea. Pain is both abdominal and back pain for the past 3-4 months.  comes and goes randomly.  doesn't know what makes it worse.  GI cokctail made it better.  She describes it as "my stomach knotting up".  She states it is all over the abdomen and is not localized to one area.  She states it can change on location at different times.   She states eating does not make it worse.  She denies current back pain, but states her lower back pain intermittently.              She notes that her periods have been irregular.  She is not currently on birth control - has insurance so she can get it again.  +sexually active.       Shealso c/o dysuria, frequency for a few days.  no fevers.          Physical Exam  General:     Well-developed,well-nourished,in no acute distress; alert,appropriate and cooperative throughout examination Abdomen:     Bowel sounds positive,abdomen soft and  without masses, organomegaly or hernias noted.  slightly tender on left abdominal wall.no guarding and no rigidity.      Impression & Recommendations:  Problem # 1:  ABDOMINAL PAIN, UNSPECIFIED SITE (ICD-789.00) Assessment: Unchanged Stoumach - Maalox - was in that cocktail. helps  with stoumach burning.  Exercise - 20 minutes every day - walking without stopping.  Water - increase to 4 bottles a day with the goal of drinkin 8.  Concern for gas - exercise should help.   Orders: [facility]- Est  Level 4 (62952)   Problem # 2:  DYSURIA (ICD-788.1) Assessment: New  Her updated medication list for this problem includes:    Clindamycin Hcl 300 Mg Caps (Clindamycin hcl) ..... One tab by mouth bid    Metronidazole 500 Mg Tabs (Metronidazole) ..... One tablet by mouth twice daily for seven days  Orders: Urinalysis-[facility] (00000) [facility]- Est  Level 4 (84132)   Problem # 3:  AMENORRHEA (ICD-626.0) Assessment: Unchanged Birth control - Will try Yaz again.  Does not protect againt STDs - you still need a condom.   Her updated medication list for this problem includes:    Sprintec 28 0.25-35 Mg-mcg Tabs (Norgestimate-eth estradiol) ..... One tab by mouth daily    Yaz 3-0.02 Mg Tabs (Drospirenone-ethinyl estradiol) .Marland Kitchen... 1 by mouth daily as directed  Orders: U Preg-[facility] (  04540) [facility]- Est  Level 4 (99214)   Complete Medication List: 1)  Sprintec 28 0.25-35 Mg-mcg Tabs (Norgestimate-eth estradiol) .... One tab by mouth daily 2)  Diflucan 100 Mg Tabs (Fluconazole) .... 2 on day 1, then 1 by mouth once daily day2-5 3)  Clindamycin Hcl 300 Mg Caps (Clindamycin hcl) .... One tab by mouth bid 4)  Tramadol Hcl 50 Mg Tabs (Tramadol hcl) .... One tablt by mouth every eight hours as needed for pain 5)  Metronidazole 500 Mg Tabs (Metronidazole) .... One tablet by mouth twice daily for seven days 6)  Bentyl 20 Mg Tabs (Dicyclomine hcl) .Marland Kitchen.. 1 tab by mouth three times a day. 7)  Yaz 3-0.02 Mg Tabs (Drospirenone-ethinyl estradiol) .Marland Kitchen.. 1 by mouth daily as directed   Complete Medication List: 1)  Sprintec 28 0.25-35 Mg-mcg Tabs (Norgestimate-eth estradiol) .... One tab by mouth daily 2)  Diflucan 100 Mg Tabs (Fluconazole) .... 2 on day 1, then 1 by mouth once daily day2-5 3)  Clindamycin Hcl 300  Mg Caps (Clindamycin hcl) .... One tab by mouth bid 4)  Tramadol Hcl 50 Mg Tabs (Tramadol hcl) .... One tablt by mouth every eight hours as needed for pain 5)  Metronidazole 500 Mg Tabs (Metronidazole) .... One tablet by mouth twice daily for seven days 6)  Bentyl 20 Mg Tabs (Dicyclomine hcl) .Marland Kitchen.. 1 tab by mouth three times a day. 7)  Yaz 3-0.02 Mg Tabs (Drospirenone-ethinyl estradiol) .Marland Kitchen.. 1 by mouth daily as directed   Patient Instructions: 1)  Stoumach - Maalox - was in that cocktail. helps with stoumach burning.  Exercise - 20 minutes every day - walking without stopping.  Water - increase to 4 bottles a day with the goal of drinkin 8.   2)  Birth control - Will try Yaz again.  Does not protect againt STDs - you still need a condom.     Prescriptions: YAZ 3-0.02 MG  TABS (DROSPIRENONE-ETHINYL ESTRADIOL) 1 by mouth daily as directed  #1 x 12   Entered and Authorized by:   Rolm Gala MD   Signed by:   Rolm Gala MD on 03/20/2008   Method used:   Print then Give to Patient   RxID:   [id number]  ] Laboratory Results   Urine Tests  Date/Time Received: March 20, 2008 10:00 AM  Date/Time Reported: March 20, 2008 10:16 AM    Routine Urinalysis   Color: yellow Appearance: Clear Glucose: negative   (Normal Range: Negative) Bilirubin: negative   (Normal Range: Negative) Ketone: negative   (Normal Range: Negative) Spec. Gravity: 1.025   (Normal Range: 1.003-1.035) Blood: negative   (Normal Range: Negative) pH: 7.0   (Normal Range: 5.0-8.0) Protein: negative   (Normal Range: Negative) Urobilinogen: 1.0   (Normal Range: 0-1) Nitrite: negative   (Normal Range: Negative) Leukocyte Esterace: negative   (Normal Range: Negative)    Urine HCG: negative Comments: ...........test performed by...........Marland KitchenTerese Door, CMA

## 2010-10-11 NOTE — Letter (Signed)
Summary: Generic Letter  [facility]  [address]   [place], [place] [address]   Phone: [phone]  Fax: [phone]    09/26/2007  PALLAS WAHLERT [address] [place], [place]  [address]  Dear Ms. SANDALL,  Your recent lab results all looked good.  All of your STD tests were negative including HIV, Gonorrhea, Syphilis, chlamydia.  There were no evidence of a specific cause for your irregular periods, so continuing the birth control pills will be appropriate.  Also, your PAP smear was normal.  Sincerely,   Angeline Slim MD [facility]  Appended Document: Generic Letter sent

## 2010-10-11 NOTE — Progress Notes (Signed)
Summary: Triage  Phone Note Call from Patient Call back at [phone]   Caller: Patient Summary of Call: pt has had her period since Feb the last time she was in.  What should she do. Initial call taken by: Clydell Hakim,  January 07, 2009 11:56 AM  Follow-up for Phone Call        left message Follow-up by: Golden Circle RN,  January 07, 2009 11:59 AM  Additional Follow-up for Phone Call Additional follow up Details #1::        wants to be seen asap. states she has been having menstrual flow since Feb 2010. appt with Dr. Pearletha Forge at 9:45am Friday. she did not want to wait to see her md Additional Follow-up by: Golden Circle RN,  January 07, 2009 12:11 PM

## 2010-10-11 NOTE — Letter (Signed)
Summary: Generic Letter  [facility]  [address]   [place], [place] [address]   Phone: [phone]  Fax: [phone]    09/28/2009  [facility] [address] [place], [place]  [address]  Dear Ms. Ivette Loyal,  Your recent labwork was excellent.  There is no sign of diabetes at this time and your cholesterol is excellent.  If you have any questions, please call our office.          Sincerely,   Ardeen Garland  MD  Appended Document: Generic Letter mailed.

## 2010-10-11 NOTE — Assessment & Plan Note (Signed)
Summary: spotting between periods,tcb   Vital Signs:  Patient profile:   25 year old female Weight:      275 pounds Pulse rate:   75 / minute BP sitting:   134 / 67  (right arm)  Vitals Entered By: Arlyss Repress CMA, (September 24, 2009 10:03 AM) CC: irregular menses. does not take birth control pills, only once in a while, to stop menses. last time she used bcp was in oct 2010. used 2 weeks of bcp and bleeding stopped. uses condoms sometimes, but also wants to be pregnant. LMP 07-20-09. has been spotting..light since 11-10.  Is Patient Diabetic? No Pain Assessment Patient in pain? no        Primary Care Provider:  Ardeen Garland  MD  CC:  irregular menses. does not take birth control pills, only once in a while, to stop menses. last time she used bcp was in oct 2010. used 2 weeks of bcp and bleeding stopped. uses condoms sometimes, and but also wants to be pregnant. LMP 07-20-09. has been spotting..light since 11-10. Marland Kitchen  History of Present Illness: Crystal [name] comes in today to discuss irregular menses, PCOS, and her desire to get pregnant. 1) irregular menses - seen almost 1 year ago for same problem.  diagnosed clinically with PCOS.  At that time was not desiring pregnancy and so was placed on birth control pills.  Did not start them right away but after she did her menses did regulate and she had only one period a month and no intermentrual bleeding.  In October she stopped the pills as she and her boyfriend are now desiring to have a baby together.  She had a normal menses in November but since then has been having very light spotting most days. 2) PCOS - still not sure she understands what it is or believe she has it.  Discussed it is essentially a clinical diagnosis.  There is no one specific test that diagnoses it.  There are tests that can add more information but she meets the clinical criteria and the tests would not really change her treatment/management. 3) Desire to get pregnant -  now  wanting to get pregnant.  Wants to know if there is something other than birth control pills that can regulate her menses.  FBS 18 months ago was normal.  Not tested since then.  Never had cholesterol checked.  Grandmother has diabetes but no diabetes in parents or siblings.   Allergies: No Known Drug Allergies  Physical Exam  General:  obese, alert, well-hydrated in NAD, very pleasant.  vitals reviewed.  Psych:  Oriented X3, memory intact for recent and remote, normally interactive, good eye contact, not anxious appearing, and not depressed appearing.     Impression & Recommendations:  Problem # 1:  POLYCYSTIC OVARIAN DISEASE (ICD-256.4) Assessment New Gave handout on PCOS.  Seems more reassured of diagnosis.  Will check for diabetes and hyperlipidemia today.  She is overdue for pap, but could not do it today.  Will start her on Metformin to try to help regulate menses and improve fertility. Discussed it takes time (months) and to be patient.  Would like to see her back in 2-3 months to see how she is coming along with the metformin as well as do pap.  Patient agreed.  Will return if develops prolonged heavy bleeding.  Orders: Comp Met-[facility] [id number]) Lipid-[facility] ([id number]) [facility]- Est  Level 4 (29562)  Complete Medication List: 1)  Metformin Hcl 500 Mg Tabs (Metformin hcl) .Marland KitchenMarland KitchenMarland Kitchen  1 tab by mouth with dinner for 5 days, then 1 tab with lunch and dinner for 5 days and then 1 tab with three times a day with meals 2)  Prenatal Vitamins 0.8 Mg Tabs (Prenatal multivit-min-fe-fa) .Marland Kitchen.. 1 tab by mouth daily  Other Orders: U Preg-[facility] ([address])  Patient Instructions: 1)  Start the metformin.  You will increase your dose every 5 days until you are taking 1 pill 3 times a day with meals.  2)  It is also a good idea to take a prenatal vitamin daily when you are trying to get pregnant.  The most important time to be taking folic acid is very early in pregnancy, often before a woman even knows she is  pregnant.  That is why we recommend them while you are still trying to get pregnant. 3)  Please return in 2-3 months for a recheck as well as a pap smear.  Prescriptions: PRENATAL VITAMINS 0.8 MG TABS (PRENATAL MULTIVIT-MIN-FE-FA) 1 tab by mouth daily  #90 x 3   Entered and Authorized by:   Ardeen Garland  MD   Signed by:   Ardeen Garland  MD on 09/24/2009   Method used:   Print then Give to Patient   RxID:   [id number] METFORMIN HCL 500 MG TABS (METFORMIN HCL) 1 tab by mouth with dinner for 5 days, then 1 tab with lunch and dinner for 5 days and then 1 tab with three times a day with meals  #90 x 5   Entered and Authorized by:   Ardeen Garland  MD   Signed by:   Ardeen Garland  MD on 09/24/2009   Method used:   Print then Give to Patient   RxID:   [id number]   Laboratory Results   Urine Tests  Date/Time Received: September 24, 2009 10:15 AM  Date/Time Reported: September 24, 2009 10:23 AM     Urine HCG: negative Comments: ...........test performed by...........Marland KitchenTerese Door, CMA

## 2010-10-11 NOTE — Assessment & Plan Note (Signed)
Summary: vag irritation   Vital Signs:  Patient Profile:   25 Years Old Female Height:     66.5 inches Weight:      252 pounds BMI:     40.21 Temp:     99.5 degrees F Pulse rate:   81 / minute BP sitting:   133 / 72  Vitals Entered By: Dennison Nancy RN (October 03, 2007 4:13 PM)                 Chief Complaint:  Work in for vaginal irritation.  History of Present Illness: Seen in the beinning of the month and treated for vaginal candidiasis and BV.  Seemed to improve but now itching and irritated.  Burns on the outside when she voids.  No new sexual partners since then.  Recently started OCPs.        Physical Exam  Genitalia:     Wet prep done by myself without speculum-thick cheezy discharge, + yeast    Impression & Recommendations:  Problem # 1:  CANDIDIASIS, VAGINAL (ICD-112.1) Recommended OTC cream to use as needed for reacurring symptoms. Her updated medication list for this problem includes:    Diflucan 100 Mg Tabs (Fluconazole) .Marland Kitchen... 2 on day 1, then 1 by mouth once daily day2-5  Orders: [facility]- Est Level  2 ([address])   Complete Medication List: 1)  Yaz 3-0.02 Mg Tabs (Drospirenone-ethinyl estradiol) .... One tab by mouth daily 2)  Diflucan 100 Mg Tabs (Fluconazole) .... 2 on day 1, then 1 by mouth once daily day2-5 3)  Metronidazole 500 Mg Tabs (Metronidazole) .... One tab by mouth two times a day x 7 days  Other [facility] (00000) [facility]   Patient Instructions: 1)  May purchase vaginal yeast creams (generic is fine) in the store and [place] at bedtime for 3-5 days.  Can use the cream on the outside of vagina as well.    Prescriptions: DIFLUCAN 100 MG TABS (FLUCONAZOLE) 2 on Day 1, then 1 by mouth once daily Day2-5  #6 x 1   Entered and Authorized by:   Luretha Murphy NP   Signed by:   Luretha Murphy NP on 10/03/2007   Method used:   Electronically sent to ...       [facility]  [place] #[id number]*       [address]  [place], [place]  [address]       Ph: [phone]       Fax: [phone]   RxID:   [id number]  ] Laboratory Results   Urine Tests  Date/Time Received: October 03, 2007 4:26 PM  Date/Time Reported: October 03, 2007 4:53 PM   Routine Urinalysis   Color: yellow Appearance: Clear Glucose: negative   (Normal Range: Negative) Bilirubin: negative   (Normal Range: Negative) Ketone: negative   (Normal Range: Negative) Spec. Gravity: 1.020   (Normal Range: 1.003-1.035) Blood: negative   (Normal Range: Negative) pH: 7.0   (Normal Range: 5.0-8.0) Protein: negative   (Normal Range: Negative) Urobilinogen: 0.2   (Normal Range: 0-1) Nitrite: negative   (Normal Range: Negative) Leukocyte Esterace: small   (Normal Range: Negative)  Urine Microscopic WBC/hpf: 1-5 Bacteria: 2+ Epithelial: 10-20 Yeast/HPF: mod spores & hyuphae    Comments: dip stick  ..........test performed by ...........Marland KitchenDelano Metz, Cytogeneticist ..........verified by ..........Marland KitchenBonnie A. [place], [place] (ASCP)4:53 PM micro  ...............test performed by......Marland KitchenBonnie A. [place], [place] (ASCP)  ...............test performed by......Marland KitchenBonnie A. [place], [place] (ASCP)  Date/Time Received: October 03, 2007  4:41 PM  Date/Time Reported: October 03, 2007 4:44 PM   Wet Mount/KOH Source: vag WBC/hpf: 1-5 Bacteria/hpf: 3+  Rods Clue cells/hpf: none  Negative whiff Yeast/hpf: loaded! Trichomonas/hpf: none Comments: ...............test performed by......Marland KitchenBonnie A. [place], [place] (ASCP)

## 2010-10-11 NOTE — Assessment & Plan Note (Signed)
Summary: unresolved pain in stomach/el   Vital Signs:  Patient Profile:   25 Years Old Female Height:     66.5 inches Weight:      249.8 pounds Temp:     98.6 degrees F Pulse rate:   75 / minute BP sitting:   131 / 76  (right arm)  Vitals Entered By: Theresia Lo RN (September 20, 2007 10:44 AM)             Is Patient Diabetic? No     Chief Complaint:  no period since  Oct. 2008 and back and abdominal pain.      Risk Factors:  Tobacco use:  current    Year started:  smokes occassionally maybe twice a week      Complete Medication List: 1)  Yaz 3-0.02 Mg Tabs (Drospirenone-ethinyl estradiol) .... One tab by mouth daily 2)  Diflucan 150 Mg Tabs (Fluconazole) .... One tab x 1 to cure yeast infection. 3)  Metronidazole 500 Mg Tabs (Metronidazole) .... One tab by mouth two times a day x 7 days     Prescriptions: METRONIDAZOLE 500 MG  TABS (METRONIDAZOLE) One tab by mouth two times a day x 7 days  #14 x 0   Entered and Authorized by:   Angeline Slim MD   Signed by:   Angeline Slim MD on 09/20/2007   Method used:   Electronically sent to ...       [facility]  [address] [phone]*       [address]       [place], [place]  [address]       Ph: [phone]       Fax: [phone]   RxID:   [id number] DIFLUCAN 150 MG TABS (FLUCONAZOLE) One tab X 1 to cure yeast infection.  #1 x 2   Entered and Authorized by:   Angeline Slim MD   Signed by:   Angeline Slim MD on 09/20/2007   Method used:   Electronically sent to ...       [facility]  [address] [phone]*       [address]       [place], [place]  [address]       Ph: [phone]       Fax: [phone]   RxID:   [id number] YAZ 3-0.02 MG  TABS (DROSPIRENONE-ETHINYL ESTRADIOL) One tab by mouth daily  #28 x 11   Entered and Authorized by:   Angeline Slim MD   Signed by:   Theresia Lo RN on 09/20/2007   Method used:   Electronically sent to ...       [id number]*       [address]       [place], [place]  [address]       Ph: [phone]       Fax: [phone]   RxID:   [id number]  ]  Vital Signs:  Patient Profile:   25 Years Old Female Height:     66.5 inches Weight:      249.8 pounds Temp:     98.6 degrees F Pulse rate:   75 / minute BP sitting:   131 / 76                Laboratory Results   Urine Tests  Date/Time Received: September 20, 2007 10:55 AM  Date/Time Reported: September 20, 2007 11:00 AM September 20, 2007 11:40 AM   Routine  Urinalysis   Color: yellow Appearance: Clear Glucose: negative   (Normal Range: Negative) Bilirubin: negative   (Normal Range: Negative) Ketone: negative   (Normal Range: Negative) Spec. Gravity: 1.020   (Normal Range: 1.003-1.035) Blood: negative   (Normal Range: Negative) pH: 8.0   (Normal Range: 5.0-8.0) Protein: negative   (Normal Range: Negative) Urobilinogen: 0.2   (Normal Range: 0-1) Nitrite: negative   (Normal Range: Negative) Leukocyte Esterace: negative   (Normal Range: Negative)    Urine HCG: negative Comments: u preg .......................................................DONNA LORING  September 20, 2007 11:00 AM urine dip stick ...............test performed by......Marland KitchenBonnie A. [place], [place] (ASCP)   Date/Time Received: September 20, 2007 11:30 AM  Date/Time Reported: September 20, 2007 11:34 AM   Wet Mount/KOH Source: Vaginal WBC/hpf: rare Bacteria/hpf: 3+ Clue cells/hpf: few Yeast/hpf: moderate Trichomonas/hpf: none Comments: ...................................................................DONNA [facility]  September 20, 2007 11:35 AM     Appended Document: unresolved pain in stomach/el      History of Present Illness: 25 y/o female with:  1) Amennorrhea - This has been going on now for about 4 months.  She has taken multiple pregnancy tests which were negative.  She denies hirsutism.  She is on no medicines and hasn't been on depo provera recently.  No nipple discharge.  No  weight loss, but has had progressive weight gain over the last 2 years or so and is overweight at  ~250 lbs.  Before that time she had regular periods.  Is > 1 year from last pap.  2) Back and abdominal pain - This has been chronic.  It is located in the suprapubic region and radiates to her back.  It is mild (3-4/10).  It is an achey pain.  It is associated with mild dysuria and vaginal discharge.  ALso having some chronic diarrhea, but no constipation.  No nausea and vomiting.  + sexually active with no protection used at last intercourse.        Physical Exam  General:     Well-developed,well-nourished,in no acute distress; alert,appropriate and cooperative throughout examination Lungs:     Normal respiratory effort, chest expands symmetrically. Lungs are clear to auscultation, no crackles or wheezes. Heart:     Normal rate and regular rhythm. S1 and S2 normal without gallop, murmur, click, rub or other extra sounds. Abdomen:     Bowel sounds positive,abdomen soft and non-tender without masses, organomegaly or hernias noted. Skin:     Intact without suspicious lesions or rashes Psych:     Cognition and judgment appear intact. Alert and cooperative with normal attention span and concentration. No apparent delusions, illusions, hallucinations    Impression & Recommendations:  Problem # 1:  PELVIC PAIN, ACUTE (ICD-789.09) Assessment: Deteriorated We checked U/A, wet prep, GC, chlamydia.  U/A normal.  Some yeast and clue cells.  Given diflucan and flagyl.  Await GC/chlam results.  If it continues, she will return.  It may be related to IBS or other issues as the yeast/bv are unlikely causes of the pain. Orders: [facility]- Est  Level 4 (99214)   Problem # 2:  COMMUNICABLE DISEASE, EXPOSURE TO (ICD-V01.9) Assessment: Deteriorated GC/chlam/HIV/RPR checked. Orders: [facility]- Est  Level 4 ([address]109)   Problem # 3:  AMENORRHEA (ICD-626.0) Assessment: Deteriorated Started Yaz.  Tested  prolactin, tsh, testosterone levels.  Likely PCOS, but we need to rule out other causes.  pregnancy negative.  Her updated medication list for this problem includes:    Yaz 3-0.02 Mg Tabs (Drospirenone-ethinyl estradiol) .Marland KitchenMarland KitchenMarland KitchenMarland Kitchen  One tab by mouth daily  Orders: [facility]- Est  Level 4 (99214)   Problem # 4:  SCREENING FOR MALIGNANT NEOPLASM OF THE CERVIX (ICD-V76.2) Assessment: Comment Only PAP performed since she was due and we were doing a pelvic anyway. Orders: [facility]- Est  Level 4 (84696)   Complete Medication List: 1)  Yaz 3-0.02 Mg Tabs (Drospirenone-ethinyl estradiol) .... One tab by mouth daily 2)  Diflucan 150 Mg Tabs (Fluconazole) .... One tab x 1 to cure yeast infection. 3)  Metronidazole 500 Mg Tabs (Metronidazole) .... One tab by mouth two times a day x 7 days     ]

## 2010-10-11 NOTE — Letter (Signed)
  12/02/2007  Marlette Slayton [address] [place], [place]  [address]  Dear Ms. Ivette Loyal,  Your recent gonorrhea and chlamydia tests were negative.   Sincerely,   Angeline Slim MD [facility]  Appended Document:  Letter sent via mail to pt ..................Marland KitchenDelores Pate-Gaddy, CMA (AAMA)

## 2010-10-11 NOTE — Miscellaneous (Signed)
  Clinical Lists Changes  Problems: Removed problem of OLIGOMENORRHEA (ICD-626.1) Removed problem of DYSURIA (ICD-788.1) Removed problem of ABDOMINAL PAIN, UNSPECIFIED SITE (ICD-789.00) Removed problem of CANDIDIASIS, VAGINAL (ICD-112.1) Removed problem of VAGINAL DISCHARGE (ICD-623.5) Removed problem of DYSURIA (ICD-788.1) Removed problem of COMMUNICABLE DISEASE, EXPOSURE TO (ICD-V01.9) Removed problem of SCREENING FOR MALIGNANT NEOPLASM OF THE CERVIX (ICD-V76.2) Removed problem of PELVIC PAIN, ACUTE (ICD-789.09) Removed problem of TENSION HEADACHE (ICD-307.81) Removed problem of AMENORRHEA (ICD-626.0) Medications: Removed medication of MEDROXYPROGESTERONE ACETATE 10 MG TABS (MEDROXYPROGESTERONE ACETATE) Take 1 by mouth daily for 5 days.

## 2010-10-11 NOTE — Miscellaneous (Signed)
Summary: rx for metformin  Clinical Lists Changes  Medications: Removed medication of GLUMETZA 1000 MG XR24H-TAB (METFORMIN HCL) Take 2 at night with dinner. Added new medication of METFORMIN HCL 500 MG XR24H-TAB (METFORMIN HCL) Take 4 by mouth every evening with dinner - Signed Rx of METFORMIN HCL 500 MG XR24H-TAB (METFORMIN HCL) Take 4 by mouth every evening with dinner;  #240 x 2;  Signed;  Entered by: Keegan Bensch [place] MD;  Authorized by: Nykeem Citro [place] MD;  Method used: Electronically to [facility] [address], [place], [place], [place]  [address], Ph: [phone], Fax: [phone]    Prescriptions: METFORMIN HCL 500 MG XR24H-TAB (METFORMIN HCL) Take 4 by mouth every evening with dinner  #240 x 2   Entered and Authorized by:   Jeilani Grupe [place] MD   Signed by:   Meg Niemeier [place] MD on 12/27/2009   Method used:   Electronically to        [facility] (retail)       [address]       [place], [place]  [address]       Ph: [phone]       Fax: [phone]   RxID:   [id number]

## 2010-10-11 NOTE — Miscellaneous (Signed)
Summary: Tobacco Naziya Hegwood  Clinical Lists Changes  Problems: Added new problem of TOBACCO Karolina Zamor (ICD-305.1) 

## 2010-10-11 NOTE — Assessment & Plan Note (Signed)
Summary: yeast infection wp   Vital Signs:  Patient Profile:   25 Years Old Female Height:     66.5 inches Weight:      255.6 pounds Temp:     99.4 degrees F Pulse rate:   79 / minute BP sitting:   118 / 78  (left arm)  Pt. in pain?   yes    Location:   abd pain  Vitals Entered By: Alphia Kava (November 29, 2007 3:48 PM)              Is Patient Diabetic? No     Chief Complaint:  vaginal discharge, frequency, and low back and abd pain.  History of Present Illness: 25 y/o with discharge and amenorrhea and pelvic pain   1) Pelvic pain - After treatment with flagyl at the last visit, this subsided for about one week before getting worse again.  It is associated with a discharge, but the discharge is less this time.  It is associated with frequency and dysuria.  Denies vaginal odor.  No itch.  2) Amennorrhea - last menstrual period feb 5.  + sexually active last 15 days ago.  Hasn't used yaz because she lost her insurance.          Physical Exam  General:     Well-developed,well-nourished,in no acute distress; alert,appropriate and cooperative throughout examination Abdomen:     Bowel sounds positive,abdomen soft and without masses, organomegaly or hernias noted.  Her abdomen has mild  migratory pain on deep palpation Genitalia:     Normal introitus for age, no external lesions, no vaginal discharge, mucosa pink and moist, no vaginal or cervical lesions, no vaginal atrophy, no friaility or hemorrhage, normal uterus size and position, no adnexal masses or tenderness    Impression & Recommendations:  Problem # 1:  VAGINAL DISCHARGE (ICD-623.5) Assessment: Deteriorated We will treat with clindamycin 300 mg two times a day this time x 14 days since she seems to have relapsed so quickly.  consider longterm prophylaxis if this continues.  Await GC and chlamydia. Orders: GC/Chlamydia-[facility] (87591/87491) Wet Prep- [facility] ([address]109) [facility]- Est Level  3 (60454)   Problem # 2:   DYSURIA (ICD-788.1) urine clear.  Orders: Urinalysis-[facility] (00000)  Her updated medication list for this problem includes:    Clindamycin Hcl 300 Mg Caps (Clindamycin hcl) ..... One tab by mouth bid   Problem # 3:  AMENORRHEA (ICD-626.0) Assessment: Unchanged pregnancy negative.  We will start sprintec instead of yaz since she lost insurance.  She should be able to get this for $10 at [facility].  Her updated medication list for this problem includes:    Sprintec 28 0.25-35 Mg-mcg Tabs (Norgestimate-eth estradiol) ..... One tab by mouth daily  Orders: U Preg-[facility] (81025) [facility]- Est Level  3 (99213)   Complete Medication List: 1)  Sprintec 28 0.25-35 Mg-mcg Tabs (Norgestimate-eth estradiol) .... One tab by mouth daily 2)  Diflucan 100 Mg Tabs (Fluconazole) .... 2 on day 1, then 1 by mouth once daily day2-5 3)  Clindamycin Hcl 300 Mg Caps (Clindamycin hcl) .... One tab by mouth bid     Prescriptions: CLINDAMYCIN HCL 300 MG  CAPS (CLINDAMYCIN HCL) One tab by mouth BID  #28 x 0   Entered and Authorized by:   Angeline Slim MD   Signed by:   Angeline Slim MD on 11/29/2007   Method used:   Electronically sent to ...       [facility]  [address]       [place], [place]  [address]       Ph: [phone]       Fax: [phone]   RxID:   [id number] SPRINTEC 28 0.25-35 MG-MCG  TABS (NORGESTIMATE-ETH ESTRADIOL) One tab by mouth daily  #30 x 11   Entered and Authorized by:   Angeline Slim MD   Signed by:   Angeline Slim MD on 11/29/2007   Method used:   Electronically sent to ...       [facility]       [address]       [place], [place]  [address]       Ph: [phone]       Fax: [phone]   RxID:   [id number]  ] Laboratory Results   Urine Tests  Date/Time Received: November 29, 2007 4:16 PM  Date/Time Reported: November 29, 2007 4:33 PM   Routine Urinalysis   Color: yellow Appearance:  Clear Glucose: negative   (Normal Range: Negative) Bilirubin: negative   (Normal Range: Negative) Ketone: negative   (Normal Range: Negative) Spec. Gravity: 1.020   (Normal Range: 1.003-1.035) Blood: negative   (Normal Range: Negative) pH: 6.5   (Normal Range: 5.0-8.0) Protein: negative   (Normal Range: Negative) Urobilinogen: 1.0   (Normal Range: 0-1) Nitrite: negative   (Normal Range: Negative) Leukocyte Esterace: negative   (Normal Range: Negative)    Urine HCG: negative Comments: ...................................................................DONNA [facility]  November 29, 2007 4:33 PM  Date/Time Received: November 29, 2007 4:16 PM  Date/Time Reported: November 29, 2007 4:33 PM   [employer] Source: VAGINAL WBC/hpf: 0-3 Bacteria/hpf: 3+  Cocci Clue cells/hpf: moderate  Positive whiff Yeast/hpf: none Trichomonas/hpf: none Comments: ...................................................................DONNA [facility]  November 29, 2007 4:33 PM

## 2010-10-13 NOTE — Assessment & Plan Note (Signed)
Summary: nosebleeds,df   Vital Signs:  Patient profile:   25 year old female Height:      66.5 inches Weight:      275 pounds BMI:     43.88 Temp:     98.7 degrees F oral Pulse rate:   84 / minute BP sitting:   118 / 72  (left arm) Cuff size:   large  Vitals Entered By: Tessie Fass CMA (September 30, 2010 3:57 PM) CC: nosebleeds Is Patient Diabetic? No Pain Assessment Patient in pain? no        Primary Care Provider:  Ardeen Garland  MD  CC:  nosebleeds.  History of Present Illness: No actual epistaxis, nose chronically feels stuffy, when she puts a tissue up to clean out her nose, there is a trace of blood on the tissue.  Smokes cigarettes.  Was taking metformin for PCO syndrome, this seemed to regulate her periods.  She self-discontiued and now again periods are very irregular.  Discussed her obesity, she drinks a lot of calories all day.  Current Medications (verified): 1)  Metformin Hcl 500 Mg Xr24h-Tab (Metformin Hcl) .... One Tab Three Times A Day 2)  Flonase 50 Mcg/act Susp (Fluticasone Propionate) .... Two Squirts in Each Nostril Daily  Allergies (verified): No Known Drug Allergies  Review of Systems      See HPI  Physical Exam  General:  alert, morbid obesity.   Ears:  TM somewhat retracted Nose:  red inflammed turbs Mouth:  post nasal drainage, crowding with small oral-pharyns and enlarged soft non exudative tonsils Neck:  No deformities, masses, or tenderness noted. Lungs:  normal respiratory effort and normal breath sounds.   Heart:  normal rate and regular rhythm.     Impression & Recommendations:  Problem # 1:  ALLERGIC RHINITIS (ICD-477.9) discussed smoking cessation, not manually irritating her nares. Her updated medication list for this problem includes:    Flonase 50 Mcg/act Susp (Fluticasone propionate) .Marland Kitchen..Marland Kitchen Two squirts in each nostril daily  Orders: [facility]- Est Level  3 ([address])  Problem # 2:  POLYCYSTIC OVARIAN DISEASE  (ICD-256.4) discussed at length, use of metformin and importance of weight loss, neg UPT.  She would like to get pregnant Orders: [facility]- Est Level  3 ([address])  Complete Medication List: 1)  Metformin Hcl 500 Mg Xr24h-tab (Metformin hcl) .... One tab three times a day 2)  Flonase 50 Mcg/act Susp (Fluticasone propionate) .... Two squirts in each nostril daily  Other Orders: U Preg-[facility] ([address])   Patient Instructions: 1)  Do not drink calories 2)  Restart you metformin 3)  for your nose use the nasal spray for at least one month and you decided. 4)  Saline nasal spray or gel ([place]). 5)  Please schedule a follow-up appointment in 2 months.  Prescriptions: METFORMIN HCL 500 MG XR24H-TAB (METFORMIN HCL) one tab three times a day  #90 x 3   Entered and Authorized by:   Luretha Murphy NP   Signed by:   Luretha Murphy NP on 09/30/2010   Method used:   Electronically to        [facility] (retail)       [address]       [place], [place]  [address]       Ph: [phone]       Fax: [phone]   RxID:   [id number] FLONASE 50 MCG/ACT SUSP (FLUTICASONE PROPIONATE) two squirts in each  nostril daily  #1 x 3   Entered and Authorized by:   Luretha Murphy NP   Signed by:   Luretha Murphy NP on 09/30/2010   Method used:   Electronically to        [facility] (retail)       [address]       [place], [place]  [address]       Ph: [phone]       Fax: [phone]   RxID:   [id number]    Orders Added: 1)  U Preg-[facility] [81025] 2)  [facility]- Est Level  3 [[address]]    Laboratory Results   Urine Tests  Date/Time Received: September 30, 2010 4:12 PM  Date/Time Reported: September 30, 2010 4:25 PM     Urine HCG: negative Comments: ...............test performed by......Marland KitchenBonnie A. [place], MLS (ASCP)cm

## 2010-10-27 ENCOUNTER — Encounter: Payer: Self-pay | Admitting: *Deleted

## 2011-02-15 ENCOUNTER — Ambulatory Visit (INDEPENDENT_AMBULATORY_CARE_PROVIDER_SITE_OTHER): Payer: 59 | Admitting: Family Medicine

## 2011-02-15 ENCOUNTER — Other Ambulatory Visit: Payer: Self-pay | Admitting: Family Medicine

## 2011-02-15 VITALS — BP 119/85 | HR 98 | Temp 98.5°F | Ht 67.0 in | Wt 276.0 lb

## 2011-02-15 DIAGNOSIS — R1031 Right lower quadrant pain: Secondary | ICD-10-CM

## 2011-02-15 DIAGNOSIS — N926 Irregular menstruation, unspecified: Secondary | ICD-10-CM

## 2011-02-15 DIAGNOSIS — E282 Polycystic ovarian syndrome: Secondary | ICD-10-CM

## 2011-02-15 DIAGNOSIS — F17200 Nicotine dependence, unspecified, uncomplicated: Secondary | ICD-10-CM

## 2011-02-15 LAB — POCT URINE PREGNANCY: Preg Test, Ur: NEGATIVE

## 2011-02-15 NOTE — Patient Instructions (Signed)
Your pregnancy test is negative.  We took some cultures to make sure that there is not a component of Pelvic Inflammatory Disease causing some of that pain.  I will call you if anything comes back positive and you need an antibiotic; otherwise, I will just send you a letter in the next 2-3 weeks with the results.  Please go to [facility] or [facility] and have a pelvic ultrasound done at your earliest convenience. The order is in the computer so it should be there waiting for you.  I am also putting in for a referral to the GYN clinic at [facility] so they can further evaluate your irregular menses.  Someone from here or there should call you within the next week about an appointment.  If not, please call us to find out what is going on.

## 2011-02-16 ENCOUNTER — Ambulatory Visit
Admission: RE | Admit: 2011-02-16 | Discharge: 2011-02-16 | Disposition: A | Discharge status: Home / Self Care | Payer: 59 | Source: Ambulatory Visit | Attending: Family Medicine | Admitting: Family Medicine

## 2011-02-16 DIAGNOSIS — N926 Irregular menstruation, unspecified: Secondary | ICD-10-CM

## 2011-02-16 DIAGNOSIS — E282 Polycystic ovarian syndrome: Secondary | ICD-10-CM

## 2011-02-16 DIAGNOSIS — R1031 Right lower quadrant pain: Secondary | ICD-10-CM

## 2011-02-17 ENCOUNTER — Telehealth: Payer: Self-pay | Admitting: Family Medicine

## 2011-02-17 ENCOUNTER — Ambulatory Visit (INDEPENDENT_AMBULATORY_CARE_PROVIDER_SITE_OTHER): Payer: 59 | Admitting: *Deleted

## 2011-02-17 ENCOUNTER — Encounter: Payer: Self-pay | Admitting: Family Medicine

## 2011-02-17 DIAGNOSIS — A749 Chlamydial infection, unspecified: Secondary | ICD-10-CM

## 2011-02-17 LAB — GC/CHLAMYDIA PROBE AMP, GENITAL
Chlamydia, DNA Probe: POSITIVE — AB
GC Probe Amp, Genital: NEGATIVE

## 2011-02-17 MED ORDER — AZITHROMYCIN 1 G PO PACK
1.0000 g | PACK | Freq: Once | ORAL | Status: AC
  Administered 2011-02-17: 1 g via ORAL

## 2011-02-17 NOTE — Telephone Encounter (Signed)
Called pt to inform of normal ultrasound results and positive chlamydia. Pt agrees to come to clinic today to be treated with Azithromycin 1 gram po x1. I also suggested that her partner be treated as well as this is an STI.

## 2011-02-17 NOTE — Assessment & Plan Note (Signed)
Pt currently not smoking.  Encouraged her to remain tobacco free, esp if trying to become pregnant.

## 2011-02-17 NOTE — Assessment & Plan Note (Signed)
Pt w/ left sided lower quad pain x1 day. No vag d/c, but has been having irregular periods. Neg u preg in clinic today, so unlikely 2/2 ectopic or tubal pregnancy. Has h/o PCOS but no radiographic documentation. Will get pelvic U/S to assess. Will also check GC/Chlam for possible PID.

## 2011-02-17 NOTE — Assessment & Plan Note (Signed)
Cannot find any radiographic diagnosis confirming PCOS, but patient w/ h/o metformin use when trying to get pregnant.  Will get pelvic U/S to assess.

## 2011-02-17 NOTE — Progress Notes (Signed)
Patient  in for STD treatment . Advised patient to abstain from sex for 7 days  and to tell partner to be treated. Advised to always use condoms to prevent STD's   Communicable Disease report faxed to [facility].

## 2011-02-17 NOTE — Assessment & Plan Note (Addendum)
Pt has been trying to get pregnant for the past 1-2 years with the same partner. Has been unsuccessful.  Has been on metformin in the past. Had regular periods in Jan, Feb, March then spotted throughout all of April, off and on spotting in May and June. Will get GYN referral for further w/u.

## 2011-02-17 NOTE — Progress Notes (Signed)
S: Pt comes to clinic with main complaint of left sided pain and irregular periods. 1. Right sided pain:  Felt like left side of abdomen is swelling and cramping; felt like it was going to explode yesterday. Felt tight/hard to the touch. Pain is cramping, not sharp in nature, nothing made it better or worse. This has happened before and pt just has to "sleep it off" when it happens. Does not think the pain is associated with her irregular menses; is not worse when spotting (see below). Is currently sexually active, no h/o STDs in her or her partner. No fevers, chills, or other systemic symptoms. Last BM this AM. No diarrhea, nausea, or vomiting.   2. Irregular menses: Pt needed progesterone for prolonged bleeding in 12/2009. Was trying to get pregnant at that time and was also on metformin, which made her cycles very heavy so she stopped taking it and her cycle got more regular.  Stopped metformin in December 2011.  She had a regular period, lasting ~1 week with 3 heavy days and 4 lighter days in Jan, Feb, and March of this year.  In April, she spotted throughout the whole month and then had spotting off and on throughout May.  Pt had vaginal bleeding yesterday, but none today. Still actively trying to get pregnant with one partner. Had miscarriage in 2008 with a different partner.   3. Tob Use: Quit smoking last year.   O: Temp 98.5, VSS Gen: NAD, appropriate CV: RRR Pulm: CTAB Abd: obese, soft, TTP LLQ, otherwise nontender, normal BS GU: Vaginal mucosa normal, no blood in vault, cervix normal, no lesions, not friable; no Cervical motion tenderness on bimanual exam, ovaries not palpable on bimanual.  A/P: 25 yo F p/w irregular menses and LLQ pain -u preg negative in the office, decreased concern for ectopic or tubal pregnancy -will get pelvic US to assess for PCOS, ovarian cysts or ovarian abscess/infection. Infection unlikely given no fever or systemic symptoms -GYN referral for continued irregular  menses and infertility. Pt will likely need to restart metformin if truly has PCOS.  -Will check GC/Chlam to r/o PID, although PID unlikely w/o CMT.

## 2011-02-20 ENCOUNTER — Telehealth: Payer: Self-pay | Admitting: *Deleted

## 2011-02-20 NOTE — Telephone Encounter (Signed)
Form faxed to [facility] for STD Tx.Hollye Pritt, Rodena Medin

## 2011-02-23 ENCOUNTER — Telehealth: Payer: Self-pay | Admitting: *Deleted

## 2011-02-23 NOTE — Telephone Encounter (Signed)
Waiting for pt to call back. Need to sched. GYN appt. Need to know, where pt wants to go. Lorenda Hatchet, Renato Battles

## 2011-02-23 NOTE — Progress Notes (Signed)
US performed at [facility]. Lorenda Hatchet, Renato Battles

## 2011-02-23 NOTE — Telephone Encounter (Signed)
Scheduled appt with Dr.Mody at [facility] 03-08-11 arrive at 9:15 am. This is the first available appt. If pt does not like the appt, she can r/s it. Ph # [phone]. Waiting for pt to call back. Lorenda Hatchet, Renato Battles

## 2011-02-24 NOTE — Telephone Encounter (Signed)
Called pt and informed of appt. Pt agreed. Lorenda Hatchet, Renato Battles

## 2011-03-06 ENCOUNTER — Ambulatory Visit (INDEPENDENT_AMBULATORY_CARE_PROVIDER_SITE_OTHER): Payer: 59 | Admitting: Family Medicine

## 2011-03-06 VITALS — BP 121/83 | HR 83 | Wt 273.0 lb

## 2011-03-06 DIAGNOSIS — R300 Dysuria: Secondary | ICD-10-CM

## 2011-03-06 DIAGNOSIS — N390 Urinary tract infection, site not specified: Secondary | ICD-10-CM

## 2011-03-06 LAB — POCT URINALYSIS DIPSTICK
Bilirubin, UA: NEGATIVE
Glucose, UA: NEGATIVE
Ketones, UA: NEGATIVE
Leukocytes, UA: NEGATIVE
Nitrite, UA: NEGATIVE
Protein, UA: NEGATIVE
Spec Grav, UA: 1.025
Urobilinogen, UA: 0.2
pH, UA: 6.5

## 2011-03-06 LAB — POCT UA - MICROSCOPIC ONLY

## 2011-03-06 MED ORDER — CIPROFLOXACIN HCL 500 MG PO TABS: 500.0000 mg | ORAL_TABLET | Freq: Two times a day (BID) | ORAL | Status: AC

## 2011-03-06 NOTE — Patient Instructions (Signed)
Lots of water Return for problems

## 2011-03-06 NOTE — Assessment & Plan Note (Signed)
Will treat on the basis of pyuria and recent CT cervicitis, chose quinolone for coverage of both.

## 2011-03-06 NOTE — Progress Notes (Signed)
  Subjective:    Patient ID: Crystal [name], female    DOB: 11-Jan-1986, 25 y.o.   MRN: [id number]  HPI  Treated for CT infection 10 days ago.  Still upset as she believed that her and boyfriend were solid and they were actually trying to get pregnant.  She does not want to use birth control.She has had dysuria for 3 days.  She is worried that she still may have the STD, her boyfriend told her that his results were negative.  Review of Systems  Constitutional: Negative for fever and chills.  Gastrointestinal: Negative for nausea, vomiting and abdominal pain.  Musculoskeletal: Negative for back pain.       Objective:   Physical Exam  Constitutional: She appears well-developed and well-nourished.          Assessment & Plan:

## 2011-03-14 ENCOUNTER — Encounter: Payer: Self-pay | Admitting: Family Medicine

## 2011-04-12 ENCOUNTER — Other Ambulatory Visit: Payer: Self-pay | Admitting: Family Medicine

## 2011-04-12 NOTE — Telephone Encounter (Signed)
Refill request

## 2011-04-12 NOTE — Telephone Encounter (Signed)
I was going to deny this Rx refill request since it is for an antibiotic and I feel she needs to be seen before being given additional abx, but I wanted to run it by you since you were the last one to see her and gave her the original Rx.  Thanks!

## 2011-06-07 LAB — POCT URINALYSIS DIP (DEVICE)
Bilirubin Urine: NEGATIVE
Glucose, UA: NEGATIVE
Hgb urine dipstick: NEGATIVE
Ketones, ur: NEGATIVE
Nitrite: NEGATIVE
Operator id: 126491
Protein, ur: NEGATIVE
Specific Gravity, Urine: 1.015
Urobilinogen, UA: 1
pH: 7

## 2011-06-07 LAB — COMPREHENSIVE METABOLIC PANEL
ALT: 17
AST: 26
Albumin: 3.8
Alkaline Phosphatase: 60
BUN: 6
CO2: 23
Calcium: 9.2
Chloride: 103
Creatinine, Ser: 0.87
GFR calc Af Amer: >60
GFR calc non Af Amer: >60
Glucose, Bld: 93
Potassium: 3.9
Sodium: 136
Total Bilirubin: 0.9
Total Protein: 7.1

## 2011-06-07 LAB — CBC
HCT: 40.3
Hemoglobin: 13.3
MCHC: 33.1
MCV: 82.9
Platelets: 297
RBC: 4.87
RDW: 12.4
WBC: 8

## 2011-06-07 LAB — RAPID URINE DRUG SCREEN, HOSP PERFORMED
Amphetamines: NOT DETECTED
Barbiturates: NOT DETECTED
Benzodiazepines: NOT DETECTED
Cocaine: NOT DETECTED
Opiates: NOT DETECTED
Tetrahydrocannabinol: NOT DETECTED

## 2011-06-07 LAB — POCT PREGNANCY, URINE
Operator id: 126491
Operator id: 29011
Preg Test, Ur: NEGATIVE
Preg Test, Ur: NEGATIVE

## 2011-06-07 LAB — DIFFERENTIAL
Basophils Absolute: 0
Basophils Relative: 0
Eosinophils Absolute: 0
Eosinophils Relative: 1
Lymphocytes Relative: 28
Lymphs Abs: 2.2
Monocytes Absolute: 0.5
Monocytes Relative: 6
Neutro Abs: 5.3
Neutrophils Relative %: 66

## 2011-06-07 LAB — LIPASE, BLOOD: Lipase: 37

## 2011-06-07 LAB — ETHANOL: Alcohol, Ethyl (B): <5

## 2011-06-13 LAB — POCT URINALYSIS DIP (DEVICE)
Glucose, UA: NEGATIVE mg/dL
Ketones, ur: NEGATIVE mg/dL
Nitrite: NEGATIVE
Protein, ur: 100 mg/dL — AB
Specific Gravity, Urine: 1.02 (ref 1.005–1.030)
Urobilinogen, UA: 1 mg/dL (ref 0.0–1.0)
pH: 7 (ref 5.0–8.0)

## 2011-06-13 LAB — POCT PREGNANCY, URINE: Preg Test, Ur: NEGATIVE

## 2011-06-14 LAB — POCT URINALYSIS DIP (DEVICE)
Glucose, UA: NEGATIVE
Hgb urine dipstick: NEGATIVE
Nitrite: NEGATIVE
Operator id: 235561
Protein, ur: 30 — AB
Specific Gravity, Urine: 1.025
Urobilinogen, UA: 2 — ABNORMAL HIGH
pH: 6.5

## 2011-06-14 LAB — POCT PREGNANCY, URINE: Preg Test, Ur: NEGATIVE

## 2011-06-23 LAB — POCT URINALYSIS DIP (DEVICE)
Bilirubin Urine: NEGATIVE
Glucose, UA: NEGATIVE
Ketones, ur: NEGATIVE
Nitrite: NEGATIVE
Operator id: 239701
Protein, ur: 30 — AB
Specific Gravity, Urine: 1.02
Urobilinogen, UA: 1
pH: 7

## 2011-06-23 LAB — URINE CULTURE: Colony Count: 10000

## 2011-06-23 LAB — POCT PREGNANCY, URINE
Operator id: 239701
Preg Test, Ur: NEGATIVE

## 2012-01-10 LAB — OB RESULTS CONSOLE RPR: RPR: NONREACTIVE

## 2012-01-10 LAB — OB RESULTS CONSOLE GC/CHLAMYDIA
Chlamydia: NEGATIVE
Gonorrhea: NEGATIVE

## 2012-01-10 LAB — OB RESULTS CONSOLE RUBELLA ANTIBODY, IGM: Rubella: IMMUNE

## 2012-01-10 LAB — OB RESULTS CONSOLE ABO/RH: RH Type: POSITIVE

## 2012-01-10 LAB — OB RESULTS CONSOLE HIV ANTIBODY (ROUTINE TESTING): HIV: NONREACTIVE

## 2012-01-10 LAB — OB RESULTS CONSOLE ANTIBODY SCREEN: Antibody Screen: NEGATIVE

## 2012-01-10 LAB — OB RESULTS CONSOLE HEPATITIS B SURFACE ANTIGEN: Hepatitis B Surface Ag: NEGATIVE

## 2012-02-01 ENCOUNTER — Other Ambulatory Visit: Payer: Self-pay

## 2012-04-12 ENCOUNTER — Other Ambulatory Visit (HOSPITAL_COMMUNITY): Payer: Self-pay | Admitting: Obstetrics & Gynecology

## 2012-04-12 DIAGNOSIS — Z3689 Encounter for other specified antenatal screening: Secondary | ICD-10-CM

## 2012-04-12 DIAGNOSIS — E669 Obesity, unspecified: Secondary | ICD-10-CM

## 2012-04-16 ENCOUNTER — Ambulatory Visit (HOSPITAL_COMMUNITY): Payer: 59

## 2012-04-18 ENCOUNTER — Encounter (HOSPITAL_COMMUNITY): Payer: Self-pay

## 2012-04-18 ENCOUNTER — Ambulatory Visit (HOSPITAL_COMMUNITY)
Admission: RE | Admit: 2012-04-18 | Discharge: 2012-04-18 | Disposition: A | Discharge status: Home / Self Care | Payer: 59 | Source: Ambulatory Visit | Attending: Obstetrics & Gynecology | Admitting: Obstetrics & Gynecology

## 2012-04-18 DIAGNOSIS — O358XX0 Maternal care for other (suspected) fetal abnormality and damage, not applicable or unspecified: Secondary | ICD-10-CM | POA: Insufficient documentation

## 2012-04-18 DIAGNOSIS — Z3689 Encounter for other specified antenatal screening: Secondary | ICD-10-CM

## 2012-04-18 DIAGNOSIS — Z363 Encounter for antenatal screening for malformations: Secondary | ICD-10-CM | POA: Insufficient documentation

## 2012-04-18 DIAGNOSIS — E669 Obesity, unspecified: Secondary | ICD-10-CM

## 2012-04-18 DIAGNOSIS — Z1389 Encounter for screening for other disorder: Secondary | ICD-10-CM | POA: Insufficient documentation

## 2012-07-17 LAB — OB RESULTS CONSOLE GBS: GBS: POSITIVE

## 2012-08-15 ENCOUNTER — Telehealth (HOSPITAL_COMMUNITY): Payer: Self-pay | Admitting: *Deleted

## 2012-08-15 ENCOUNTER — Encounter (HOSPITAL_COMMUNITY): Payer: Self-pay | Admitting: *Deleted

## 2012-08-15 NOTE — Telephone Encounter (Signed)
Preadmission screen  

## 2012-08-16 ENCOUNTER — Inpatient Hospital Stay (HOSPITAL_COMMUNITY)
Admission: AD | Admit: 2012-08-16 | Discharge: 2012-08-20 | DRG: 765 | Disposition: A | Discharge status: Home / Self Care | Payer: 59 | Source: Ambulatory Visit | Attending: Obstetrics and Gynecology | Admitting: Obstetrics and Gynecology

## 2012-08-16 ENCOUNTER — Encounter (HOSPITAL_COMMUNITY): Payer: Self-pay | Admitting: *Deleted

## 2012-08-16 ENCOUNTER — Encounter (HOSPITAL_COMMUNITY): Payer: Self-pay | Admitting: Anesthesiology

## 2012-08-16 ENCOUNTER — Other Ambulatory Visit: Payer: Self-pay | Admitting: Obstetrics & Gynecology

## 2012-08-16 DIAGNOSIS — Z22330 Carrier of Group B streptococcus: Secondary | ICD-10-CM

## 2012-08-16 DIAGNOSIS — Z98891 History of uterine scar from previous surgery: Secondary | ICD-10-CM | POA: Diagnosis not present

## 2012-08-16 DIAGNOSIS — O9902 Anemia complicating childbirth: Secondary | ICD-10-CM | POA: Diagnosis present

## 2012-08-16 DIAGNOSIS — O3660X0 Maternal care for excessive fetal growth, unspecified trimester, not applicable or unspecified: Secondary | ICD-10-CM | POA: Diagnosis present

## 2012-08-16 DIAGNOSIS — O4100X0 Oligohydramnios, unspecified trimester, not applicable or unspecified: Secondary | ICD-10-CM | POA: Diagnosis present

## 2012-08-16 DIAGNOSIS — O480 Post-term pregnancy: Secondary | ICD-10-CM | POA: Diagnosis present

## 2012-08-16 DIAGNOSIS — O99892 Other specified diseases and conditions complicating childbirth: Secondary | ICD-10-CM | POA: Diagnosis present

## 2012-08-16 LAB — CBC
HCT: 34.7 % — ABNORMAL LOW (ref 36.0–46.0)
Hemoglobin: 11.5 g/dL — ABNORMAL LOW (ref 12.0–15.0)
MCH: 26.3 pg (ref 26.0–34.0)
MCHC: 33.1 g/dL (ref 30.0–36.0)
MCV: 79.2 fL (ref 78.0–100.0)
Platelets: 296 10*3/uL (ref 150–400)
RBC: 4.38 MIL/uL (ref 3.87–5.11)
RDW: 15 % (ref 11.5–15.5)
WBC: 11.3 10*3/uL — ABNORMAL HIGH (ref 4.0–10.5)

## 2012-08-16 LAB — TYPE AND SCREEN
ABO/RH(D): B POS
Antibody Screen: NEGATIVE

## 2012-08-16 MED ORDER — LIDOCAINE HCL (PF) 1 % IJ SOLN
INTRAMUSCULAR | Status: DC | PRN
  Administered 2012-08-16 (×2): 8 mL

## 2012-08-16 MED ORDER — CITRIC ACID-SODIUM CITRATE 334-500 MG/5ML PO SOLN
30.0000 mL | ORAL | Status: DC | PRN
  Administered 2012-08-16: 30 mL via ORAL
  Filled 2012-08-16: qty 15

## 2012-08-16 MED ORDER — IBUPROFEN 600 MG PO TABS: 600.0000 mg | ORAL_TABLET | Freq: Four times a day (QID) | ORAL | Status: DC | PRN

## 2012-08-16 MED ORDER — OXYTOCIN 10 UNIT/ML IJ SOLN
INTRAMUSCULAR | Status: AC
  Filled 2012-08-16: qty 4

## 2012-08-16 MED ORDER — LACTATED RINGERS IV SOLN
500.0000 mL | INTRAVENOUS | Status: DC | PRN
  Administered 2012-08-16: 500 mL via INTRAVENOUS

## 2012-08-16 MED ORDER — ACETAMINOPHEN 325 MG PO TABS: 650.0000 mg | ORAL_TABLET | ORAL | Status: DC | PRN

## 2012-08-16 MED ORDER — DIPHENHYDRAMINE HCL 50 MG/ML IJ SOLN: 12.5000 mg | INTRAMUSCULAR | Status: DC | PRN

## 2012-08-16 MED ORDER — OXYCODONE-ACETAMINOPHEN 5-325 MG PO TABS: 1.0000 | ORAL_TABLET | ORAL | Status: DC | PRN

## 2012-08-16 MED ORDER — PENICILLIN G POTASSIUM 5000000 UNITS IJ SOLR
2.5000 10*6.[IU] | INTRAVENOUS | Status: DC
  Administered 2012-08-16 (×2): 2.5 10*6.[IU] via INTRAVENOUS
  Filled 2012-08-16 (×7): qty 2.5

## 2012-08-16 MED ORDER — LACTATED RINGERS IV SOLN
500.0000 mL | Freq: Once | INTRAVENOUS | Status: AC
  Administered 2012-08-16: 500 mL via INTRAVENOUS

## 2012-08-16 MED ORDER — OXYTOCIN 40 UNITS IN LACTATED RINGERS INFUSION - SIMPLE MED: 62.5000 mL/h | INTRAVENOUS | Status: DC

## 2012-08-16 MED ORDER — BUTORPHANOL TARTRATE 1 MG/ML IJ SOLN
1.0000 mg | Freq: Once | INTRAMUSCULAR | Status: AC
  Administered 2012-08-16: 1 mg via INTRAVENOUS
  Filled 2012-08-16: qty 1

## 2012-08-16 MED ORDER — LIDOCAINE-EPINEPHRINE (PF) 2 %-1:200000 IJ SOLN
INTRAMUSCULAR | Status: AC
  Filled 2012-08-16: qty 20

## 2012-08-16 MED ORDER — EPHEDRINE 5 MG/ML INJ
10.0000 mg | INTRAVENOUS | Status: DC | PRN
  Filled 2012-08-16: qty 4

## 2012-08-16 MED ORDER — FENTANYL 2.5 MCG/ML BUPIVACAINE 1/10 % EPIDURAL INFUSION (WH - ANES)
14.0000 mL/h | INTRAMUSCULAR | Status: DC
  Filled 2012-08-16: qty 125

## 2012-08-16 MED ORDER — OXYTOCIN BOLUS FROM INFUSION: 500.0000 mL | INTRAVENOUS | Status: DC

## 2012-08-16 MED ORDER — SODIUM BICARBONATE 8.4 % IV SOLN
INTRAVENOUS | Status: AC
  Filled 2012-08-16: qty 50

## 2012-08-16 MED ORDER — ONDANSETRON HCL 4 MG/2ML IJ SOLN
INTRAMUSCULAR | Status: AC
  Filled 2012-08-16: qty 2

## 2012-08-16 MED ORDER — MORPHINE SULFATE 0.5 MG/ML IJ SOLN
INTRAMUSCULAR | Status: AC
  Filled 2012-08-16: qty 10

## 2012-08-16 MED ORDER — ONDANSETRON HCL 4 MG/2ML IJ SOLN: 4.0000 mg | Freq: Four times a day (QID) | INTRAMUSCULAR | Status: DC | PRN

## 2012-08-16 MED ORDER — PHENYLEPHRINE 40 MCG/ML (10ML) SYRINGE FOR IV PUSH (FOR BLOOD PRESSURE SUPPORT)
80.0000 ug | PREFILLED_SYRINGE | INTRAVENOUS | Status: DC | PRN
  Filled 2012-08-16: qty 5

## 2012-08-16 MED ORDER — TERBUTALINE SULFATE 1 MG/ML IJ SOLN
0.2500 mg | Freq: Once | INTRAMUSCULAR | Status: AC | PRN
  Administered 2012-08-16: 0.25 mg via SUBCUTANEOUS
  Filled 2012-08-16: qty 1

## 2012-08-16 MED ORDER — LIDOCAINE HCL (PF) 1 % IJ SOLN: 30.0000 mL | INTRAMUSCULAR | Status: DC | PRN

## 2012-08-16 MED ORDER — OXYTOCIN 40 UNITS IN LACTATED RINGERS INFUSION - SIMPLE MED
1.0000 m[IU]/min | INTRAVENOUS | Status: DC
  Administered 2012-08-16: 2 m[IU]/min via INTRAVENOUS
  Filled 2012-08-16: qty 1000

## 2012-08-16 MED ORDER — FENTANYL 2.5 MCG/ML BUPIVACAINE 1/10 % EPIDURAL INFUSION (WH - ANES)
INTRAMUSCULAR | Status: DC | PRN
  Administered 2012-08-16: 14 mL/h via EPIDURAL

## 2012-08-16 MED ORDER — LACTATED RINGERS IV SOLN
INTRAVENOUS | Status: DC
  Administered 2012-08-16 (×2): via INTRAVENOUS

## 2012-08-16 MED ORDER — PHENYLEPHRINE 40 MCG/ML (10ML) SYRINGE FOR IV PUSH (FOR BLOOD PRESSURE SUPPORT): 80.0000 ug | PREFILLED_SYRINGE | INTRAVENOUS | Status: DC | PRN

## 2012-08-16 MED ORDER — EPHEDRINE 5 MG/ML INJ: 10.0000 mg | INTRAVENOUS | Status: DC | PRN

## 2012-08-16 MED ORDER — PENICILLIN G POTASSIUM 5000000 UNITS IJ SOLR
5.0000 10*6.[IU] | Freq: Once | INTRAVENOUS | Status: AC
  Administered 2012-08-16: 5 10*6.[IU] via INTRAVENOUS
  Filled 2012-08-16: qty 5

## 2012-08-16 NOTE — H&P (Signed)
Crystal [name] is a 26 y.o. G2P0010 at [redacted]w[redacted]d presenting for early labor and probable labor augmentation for oligohydramnios vs SROM. Pt notes worsening contractions since early this am . Good fetal movement, No vaginal bleeding, pt notes leaking fluid, small amount but continuous, since this am. Pt seen in the office for this complaint, no pool seen but AFI at 4cm. Pt given dx of possible SROM vs olgiohydramnios post-EDC, for augmentation in either case.   P[facility]are at [facility] since 1st trimester - morbid obesity. 35# wt gain - h/o infertility w/ spontaneous conception - s/p flu and Tdap vaccine - GBS pos - LGA w/ borderline pelvis, pt aware increased risks of c/s. U/s at 38'5: 8'13, 98%, AC 98%, grade 3 placenta  Prenatal Transfer Tool  Maternal Diabetes: No Genetic Screening: Normal Maternal Ultrasounds/Referrals: Normal Fetal Ultrasounds or other Referrals:  None Maternal Substance Abuse:  No Significant Maternal Medications:  None Significant Maternal Lab Results: None     OB History    Grav Para Term Preterm Abortions TAB SAB Ect Mult Living   2  0 0 1  1   0    SAB in 2008  Past Medical History  Diagnosis Date  . Allergy   . PCOS (polycystic ovarian syndrome)     No radiological confirmation  . Irregular menses   . Hx of chlamydia infection   . H/O varicella   . History of PID   . Obese    Past Surgical History  Procedure Date  . No past surgeries    Family History: family history includes Asthma in her father and mother; Cancer in her maternal grandmother; Diabetes in her father, maternal grandmother, and mother; Heart disease in her father and mother; and Hypertension in her father, maternal grandmother, and mother. Social History:  reports that she has quit smoking. Her smoking use included Cigarettes. She has never used smokeless tobacco. She reports that she drinks alcohol. She reports that she does not use illicit drugs.  Review of Systems - Negative  except possible ROM and contractions   Dilation: 1.5 Effacement (%): 50 Station: -2 Exam by:: Dr Algie Coffer Blood pressure 130/71, pulse 94, temperature 98.4 F (36.9 C), temperature source Oral, resp. rate 20, height 5' 6.5" (1.689 m), weight 137.349 kg (302 lb 12.8 oz), last menstrual period 11/08/2011, SpO2 100.00%.  Physical Exam:  Gen: well appearing, no distress CV: RRR Pulm: CTAB Back: no CVAT Abd: gravid, NT, no RUQ pain, obese, EFQ 9.25# LE: no edema, equal bilaterally, non-tender Toco: q 4 min FH: baseline 130s, accelerations present, no deceleratons, 10 beat variability  Prenatal labs: ABO, Rh: --/--/B POS (12/06 1321) Antibody: NEG (12/06 1321) Rubella:  immune RPR: Nonreactive (05/01 0000)  HBsAg: Negative (05/01 0000)  HIV: Non-reactive (05/01 0000)  GBS: Positive (11/06 0000)  1 hr Glucola 122  Genetic screening nl  Anatomy US nl    Assessment/Plan: 26 y.o. G2P0010 at [redacted]w[redacted]d Early labor- plan expectant management, if pt does not make active change despite uncomfortable ctx will ripen vs augment w/ pitocin - Oligo vs SROM. In either case, recommendation is to move to delivery. Will augment if needed - LGA. Possible PCS. Pt aware  Crystal [name] A. 08/16/2012 4:30 PM     Crystal [name] A. 08/16/2012, 4:18 PM

## 2012-08-16 NOTE — Anesthesia Procedure Notes (Signed)
Epidural Patient location during procedure: OB Start time: 08/16/2012 9:00 PM End time: 08/16/2012 9:06 PM  Staffing Anesthesiologist: Sandrea Hughs Performed by: anesthesiologist   Preanesthetic Checklist Completed: patient identified, site marked, surgical consent, pre-op evaluation, timeout performed, IV checked, risks and benefits discussed and monitors and equipment checked  Epidural Patient position: sitting Prep: site prepped and draped and DuraPrep Patient monitoring: continuous pulse ox and blood pressure Approach: midline Injection technique: LOR air  Needle:  Needle type: Tuohy  Needle gauge: 17 G Needle length: 9 cm and 9 Needle insertion depth: 8 cm Catheter type: closed end flexible Catheter size: 19 Gauge Catheter at skin depth: 14 cm Test dose: negative and Other  Assessment Sensory level: T8 Events: blood not aspirated, injection not painful, no injection resistance, negative IV test and no paresthesia  Additional Notes Reason for block:procedure for pain

## 2012-08-16 NOTE — MAU Note (Signed)
Patient states she was sent from the office for admission for low fluid (4.4) and contractions every 7 minutes. Has been feeling normal fetal movement. Was 2 cm in the office.

## 2012-08-16 NOTE — Progress Notes (Signed)
Since 1729 pt has been on left side, rightg side and up to bathroom

## 2012-08-16 NOTE — Progress Notes (Signed)
S: Doing well, no complaints, pain well controlled with epidural  O: BP 113/52  Pulse 101  Temp 98.6 F (37 C) (Oral)  Resp 20  Ht 5\' 6"  (1.676 m)  Wt 136.986 kg (302 lb)  BMI 48.74 kg/m2  SpO2 100%  LMP 11/08/2011   FHT:  FHR: 120s bpm, variability: moderate,  accelerations:  Present,  decelerations:  Absent UC:   irregular, every 3-5 minutes, pit at 8 munits/ min SVE:   Dilation: 3 Effacement (%): 80 Station: -2 Exam by:: Dr Ernestina Penna AROM clear   A / P:  26 y.o.  Obstetric History   G2   P0   T0   P0   A1   TAB0   SAB1   E0   M0   L0    at [redacted]w[redacted]d Protracted latent phase, now in active phase as demonstrated cervical change  IUPC placed, will titrate pitcocin  Fetal Wellbeing:  Category I Pain Control:  Epidural  Anticipated MOD:  attempt vaginal delivery though LGA and borderline pelvis  Fusaye Wachtel A. 08/16/2012, 9:52 PM

## 2012-08-16 NOTE — Progress Notes (Signed)
S: Doing well,pt notes worsening pain, not controlled w/ IV meds, ready for epidural. Per pt still leaking fluid, per RN, no fluid seen O: BP 129/66  Pulse 82  Temp 98.6 F (37 C) (Oral)  Resp 20  Ht 5\' 6"  (1.676 m)  Wt 136.986 kg (302 lb)  BMI 48.74 kg/m2  SpO2 100%  LMP 11/08/2011   FHT:  FHR: 140's bpm, variability: moderate,  accelerations:  Present,  decelerations:  Absent UC:   irregular, every 4 minutes, pit at 6 munits/ min cvx 3/90/ bulging bag, vtx --3  SVE:   Dilation: 2 Effacement (%): 90 Station: -2 Exam by:: S [name]   A / P:  26 y.o.  Obstetric History   G2   P0   T0   P0   A1   TAB0   SAB1   E0   M0   L0    at [redacted]w[redacted]d Protracted latent phase , starting to get active. Will cont pitocin, plan AROM after epidural placed.   Fetal Wellbeing:  Category I Pain Control:  Epidural planned, will get now as pt finally making active change and in labor.   Anticipated MOD:  unsure, LGA and borderline pelvis  Crystal Mcafee A. 08/16/2012, 8:09 PM

## 2012-08-16 NOTE — MAU Note (Signed)
Pt was direct admit to l&d, no beds available, will be assessed in MAU until bed available.  Believed to be ruptured, not sure of date/time.  Denies any bleeding.  Reports ucs since 0800.  + FM.

## 2012-08-16 NOTE — Anesthesia Preprocedure Evaluation (Signed)
Anesthesia Evaluation  Patient identified by MRN, date of birth, ID band Patient awake    Reviewed: Allergy & Precautions, H&P , NPO status , Patient's Chart, lab work & pertinent test results  Airway Mallampati: II TM Distance: >3 FB Neck ROM: full    Dental No notable dental hx.    Pulmonary neg pulmonary ROS,    Pulmonary exam normal       Cardiovascular negative cardio ROS      Neuro/Psych negative neurological ROS  negative psych ROS   GI/Hepatic negative GI ROS, Neg liver ROS,   Endo/Other  Morbid obesity  Renal/GU negative Renal ROS  negative genitourinary   Musculoskeletal negative musculoskeletal ROS (+)   Abdominal (+) + obese,   Peds  Hematology negative hematology ROS (+)   Anesthesia Other Findings   Reproductive/Obstetrics (+) Pregnancy                           Anesthesia Physical Anesthesia Plan  ASA: III  Anesthesia Plan: Epidural   Post-op Pain Management:    Induction:   Airway Management Planned:   Additional Equipment:   Intra-op Plan:   Post-operative Plan:   Informed Consent: I have reviewed the patients History and Physical, chart, labs and discussed the procedure including the risks, benefits and alternatives for the proposed anesthesia with the patient or authorized representative who has indicated his/her understanding and acceptance.     Plan Discussed with:   Anesthesia Plan Comments:         Anesthesia Quick Evaluation  

## 2012-08-17 ENCOUNTER — Encounter (HOSPITAL_COMMUNITY): Payer: Self-pay | Admitting: Anesthesiology

## 2012-08-17 ENCOUNTER — Inpatient Hospital Stay (HOSPITAL_COMMUNITY): Payer: 59 | Admitting: Anesthesiology

## 2012-08-17 ENCOUNTER — Encounter (HOSPITAL_COMMUNITY): Payer: Self-pay | Admitting: *Deleted

## 2012-08-17 DIAGNOSIS — O9902 Anemia complicating childbirth: Secondary | ICD-10-CM | POA: Diagnosis present

## 2012-08-17 DIAGNOSIS — Z98891 History of uterine scar from previous surgery: Secondary | ICD-10-CM | POA: Diagnosis not present

## 2012-08-17 LAB — CBC
HCT: 30.7 % — ABNORMAL LOW (ref 36.0–46.0)
Hemoglobin: 10 g/dL — ABNORMAL LOW (ref 12.0–15.0)
MCH: 25.8 pg — ABNORMAL LOW (ref 26.0–34.0)
MCHC: 32.6 g/dL (ref 30.0–36.0)
MCV: 79.1 fL (ref 78.0–100.0)
Platelets: 234 10*3/uL (ref 150–400)
RBC: 3.88 MIL/uL (ref 3.87–5.11)
RDW: 14.9 % (ref 11.5–15.5)
WBC: 16.2 10*3/uL — ABNORMAL HIGH (ref 4.0–10.5)

## 2012-08-17 LAB — RPR: RPR Ser Ql: NONREACTIVE

## 2012-08-17 MED ORDER — SODIUM BICARBONATE 8.4 % IV SOLN
INTRAVENOUS | Status: DC | PRN
  Administered 2012-08-17: 5 mL via EPIDURAL

## 2012-08-17 MED ORDER — ONDANSETRON HCL 4 MG/2ML IJ SOLN: 4.0000 mg | Freq: Three times a day (TID) | INTRAMUSCULAR | Status: DC | PRN

## 2012-08-17 MED ORDER — ONDANSETRON HCL 4 MG/2ML IJ SOLN
INTRAMUSCULAR | Status: DC | PRN
  Administered 2012-08-17: 4 mg via INTRAVENOUS

## 2012-08-17 MED ORDER — KETOROLAC TROMETHAMINE 30 MG/ML IJ SOLN: 30.0000 mg | Freq: Four times a day (QID) | INTRAMUSCULAR | Status: AC | PRN

## 2012-08-17 MED ORDER — ZOLPIDEM TARTRATE 5 MG PO TABS: 5.0000 mg | ORAL_TABLET | Freq: Every evening | ORAL | Status: DC | PRN

## 2012-08-17 MED ORDER — IBUPROFEN 600 MG PO TABS
600.0000 mg | ORAL_TABLET | Freq: Four times a day (QID) | ORAL | Status: DC
  Administered 2012-08-17 – 2012-08-20 (×13): 600 mg via ORAL
  Filled 2012-08-17 (×13): qty 1

## 2012-08-17 MED ORDER — LACTATED RINGERS IV SOLN
INTRAVENOUS | Status: DC | PRN
  Administered 2012-08-17 (×3): via INTRAVENOUS

## 2012-08-17 MED ORDER — SIMETHICONE 80 MG PO CHEW
80.0000 mg | CHEWABLE_TABLET | Freq: Three times a day (TID) | ORAL | Status: DC
  Administered 2012-08-17 – 2012-08-20 (×14): 80 mg via ORAL

## 2012-08-17 MED ORDER — SENNOSIDES-DOCUSATE SODIUM 8.6-50 MG PO TABS
2.0000 | ORAL_TABLET | Freq: Every day | ORAL | Status: DC
  Administered 2012-08-17 – 2012-08-19 (×3): 2 via ORAL

## 2012-08-17 MED ORDER — DIPHENHYDRAMINE HCL 50 MG/ML IJ SOLN: 25.0000 mg | INTRAMUSCULAR | Status: DC | PRN

## 2012-08-17 MED ORDER — MEPERIDINE HCL 25 MG/ML IJ SOLN: 6.2500 mg | INTRAMUSCULAR | Status: DC | PRN

## 2012-08-17 MED ORDER — DIPHENHYDRAMINE HCL 25 MG PO CAPS
25.0000 mg | ORAL_CAPSULE | Freq: Four times a day (QID) | ORAL | Status: DC | PRN
  Filled 2012-08-17: qty 1

## 2012-08-17 MED ORDER — NALBUPHINE HCL 10 MG/ML IJ SOLN
5.0000 mg | INTRAMUSCULAR | Status: DC | PRN
  Filled 2012-08-17: qty 1

## 2012-08-17 MED ORDER — POLYSACCHARIDE IRON COMPLEX 150 MG PO CAPS
150.0000 mg | ORAL_CAPSULE | Freq: Every day | ORAL | Status: DC
  Administered 2012-08-17 – 2012-08-20 (×4): 150 mg via ORAL
  Filled 2012-08-17 (×4): qty 1

## 2012-08-17 MED ORDER — OXYCODONE-ACETAMINOPHEN 5-325 MG PO TABS
1.0000 | ORAL_TABLET | ORAL | Status: DC | PRN
  Administered 2012-08-18 – 2012-08-19 (×7): 1 via ORAL
  Filled 2012-08-17 (×7): qty 1

## 2012-08-17 MED ORDER — TETANUS-DIPHTH-ACELL PERTUSSIS 5-2.5-18.5 LF-MCG/0.5 IM SUSP: 0.5000 mL | Freq: Once | INTRAMUSCULAR | Status: DC

## 2012-08-17 MED ORDER — LACTATED RINGERS IV SOLN
INTRAVENOUS | Status: DC
  Administered 2012-08-17: 10:00:00 via INTRAVENOUS

## 2012-08-17 MED ORDER — SCOPOLAMINE 1 MG/3DAYS TD PT72
MEDICATED_PATCH | TRANSDERMAL | Status: AC
  Filled 2012-08-17: qty 1

## 2012-08-17 MED ORDER — MEPERIDINE HCL 25 MG/ML IJ SOLN
INTRAMUSCULAR | Status: AC
  Filled 2012-08-17: qty 1

## 2012-08-17 MED ORDER — PRENATAL MULTIVITAMIN CH
1.0000 | ORAL_TABLET | Freq: Every day | ORAL | Status: DC
  Administered 2012-08-17 – 2012-08-20 (×4): 1 via ORAL
  Filled 2012-08-17 (×4): qty 1

## 2012-08-17 MED ORDER — KETOROLAC TROMETHAMINE 30 MG/ML IJ SOLN: 15.0000 mg | Freq: Once | INTRAMUSCULAR | Status: DC | PRN

## 2012-08-17 MED ORDER — DEXTROSE 5 % IV SOLN
3.0000 g | Freq: Once | INTRAVENOUS | Status: AC
  Administered 2012-08-17: 3 g via INTRAVENOUS
  Filled 2012-08-17: qty 3000

## 2012-08-17 MED ORDER — MORPHINE SULFATE (PF) 0.5 MG/ML IJ SOLN
INTRAMUSCULAR | Status: DC | PRN
  Administered 2012-08-17: 4 mg via EPIDURAL

## 2012-08-17 MED ORDER — LANOLIN HYDROUS EX OINT: 1.0000 "application " | TOPICAL_OINTMENT | CUTANEOUS | Status: DC | PRN

## 2012-08-17 MED ORDER — PROMETHAZINE HCL 25 MG/ML IJ SOLN: 6.2500 mg | INTRAMUSCULAR | Status: DC | PRN

## 2012-08-17 MED ORDER — DIPHENHYDRAMINE HCL 25 MG PO CAPS
25.0000 mg | ORAL_CAPSULE | ORAL | Status: DC | PRN
  Administered 2012-08-17: 25 mg via ORAL

## 2012-08-17 MED ORDER — METOCLOPRAMIDE HCL 5 MG/ML IJ SOLN: 10.0000 mg | Freq: Three times a day (TID) | INTRAMUSCULAR | Status: DC | PRN

## 2012-08-17 MED ORDER — CEFAZOLIN SODIUM-DEXTROSE 2-3 GM-% IV SOLR: 3.0000 g | Freq: Once | INTRAVENOUS | Status: DC

## 2012-08-17 MED ORDER — DIPHENHYDRAMINE HCL 50 MG/ML IJ SOLN: 12.5000 mg | INTRAMUSCULAR | Status: DC | PRN

## 2012-08-17 MED ORDER — NALOXONE HCL 1 MG/ML IJ SOLN
1.0000 ug/kg/h | INTRAVENOUS | Status: DC | PRN
  Filled 2012-08-17: qty 2

## 2012-08-17 MED ORDER — HYDROMORPHONE HCL PF 1 MG/ML IJ SOLN: 0.2500 mg | INTRAMUSCULAR | Status: DC | PRN

## 2012-08-17 MED ORDER — MORPHINE SULFATE (PF) 0.5 MG/ML IJ SOLN
INTRAMUSCULAR | Status: DC | PRN
  Administered 2012-08-17: 1 mg via INTRAVENOUS

## 2012-08-17 MED ORDER — OXYTOCIN 40 UNITS IN LACTATED RINGERS INFUSION - SIMPLE MED: 62.5000 mL/h | INTRAVENOUS | Status: AC

## 2012-08-17 MED ORDER — KETOROLAC TROMETHAMINE 60 MG/2ML IM SOLN
INTRAMUSCULAR | Status: AC
  Filled 2012-08-17: qty 2

## 2012-08-17 MED ORDER — ONDANSETRON HCL 4 MG PO TABS: 4.0000 mg | ORAL_TABLET | ORAL | Status: DC | PRN

## 2012-08-17 MED ORDER — WITCH HAZEL-GLYCERIN EX PADS: 1.0000 "application " | MEDICATED_PAD | CUTANEOUS | Status: DC | PRN

## 2012-08-17 MED ORDER — MEPERIDINE HCL 25 MG/ML IJ SOLN
6.2500 mg | INTRAMUSCULAR | Status: DC | PRN
  Administered 2012-08-17 (×2): 6.25 mg via INTRAVENOUS

## 2012-08-17 MED ORDER — KETOROLAC TROMETHAMINE 60 MG/2ML IM SOLN
60.0000 mg | Freq: Once | INTRAMUSCULAR | Status: AC | PRN
  Administered 2012-08-17: 60 mg via INTRAMUSCULAR

## 2012-08-17 MED ORDER — MENTHOL 3 MG MT LOZG: 1.0000 | LOZENGE | OROMUCOSAL | Status: DC | PRN

## 2012-08-17 MED ORDER — DIBUCAINE 1 % RE OINT: 1.0000 "application " | TOPICAL_OINTMENT | RECTAL | Status: DC | PRN

## 2012-08-17 MED ORDER — SCOPOLAMINE 1 MG/3DAYS TD PT72
1.0000 | MEDICATED_PATCH | Freq: Once | TRANSDERMAL | Status: AC
  Administered 2012-08-17: 1.5 mg via TRANSDERMAL

## 2012-08-17 MED ORDER — SIMETHICONE 80 MG PO CHEW: 80.0000 mg | CHEWABLE_TABLET | ORAL | Status: DC | PRN

## 2012-08-17 MED ORDER — OXYTOCIN 10 UNIT/ML IJ SOLN
40.0000 [IU] | INTRAVENOUS | Status: DC | PRN
  Administered 2012-08-17: 40 [IU] via INTRAVENOUS

## 2012-08-17 MED ORDER — ONDANSETRON HCL 4 MG/2ML IJ SOLN: 4.0000 mg | INTRAMUSCULAR | Status: DC | PRN

## 2012-08-17 MED ORDER — NALOXONE HCL 0.4 MG/ML IJ SOLN: 0.4000 mg | INTRAMUSCULAR | Status: DC | PRN

## 2012-08-17 MED ORDER — SODIUM CHLORIDE 0.9 % IJ SOLN: 3.0000 mL | INTRAMUSCULAR | Status: DC | PRN

## 2012-08-17 NOTE — Brief Op Note (Signed)
08/16/2012 - 08/18/2012  1:23 AM  PATIENT:  Crystal [name]  26 y.o. female  PRE-OPERATIVE DIAGNOSIS:  [redacted] weeks gestation with late decelerations, presumed macrosomia, arrest of dilation  POST-OPERATIVE DIAGNOSIS:  [redacted] weeks gestation with late decelerations, fetal intolerance to labor, macrosomia, arrest of dilation  PROCEDURE:  Procedure(s) (LRB) with comments: CESAREAN SECTION (N/A) - Primary Cesarean Section  with 2 layer closure  SURGEON:  Surgeon(s) and Role:    * Camara Renstrom A. Ernestina Penna, MD - Primary  PHYSICIAN ASSISTANT:   ASSISTANTS: Earl Gala, CNM   ANESTHESIA:   epidural  EBL:  Total I/O In: 2300 [I.V.:2300] Out: 950 [Urine:150; Blood:800]  BLOOD ADMINISTERED:none  DRAINS: Urinary Catheter (Foley)   LOCAL MEDICATIONS USED:  NONE  SPECIMEN:  Source of Specimen:  Placenta  DISPOSITION OF SPECIMEN:  Labor and delivery  COUNTS:  YES  TOURNIQUET:  * No tourniquets in log *  DICTATION: .Note written in EPIC  PLAN OF CARE: Admit to inpatient   PATIENT DISPOSITION:  PACU - hemodynamically stable.   Delay start of Pharmacological VTE agent (>24hrs) due to surgical blood loss or risk of bleeding: yes

## 2012-08-17 NOTE — Op Note (Signed)
08/16/2012 - 08/18/2012  1:23 AM  PATIENT:  Crystal [name]  26 y.o. female  PRE-OPERATIVE DIAGNOSIS:  [redacted] weeks gestation with late decelerations, presumed macrosomia, arrest of dilation  POST-OPERATIVE DIAGNOSIS:  [redacted] weeks gestation with late decelerations, fetal intolerance to labor, macrosomia, arrest of dilation  PROCEDURE:  Procedure(s) (LRB) with comments: CESAREAN SECTION (N/A) - Primary Cesarean Section  with 2 layer closure  SURGEON:  Surgeon(s) and Role:    * Jahsiah Carpenter A. Ernestina Penna, MD - Primary  PHYSICIAN ASSISTANT:   ASSISTANTS: Earl Gala, CNM   ANESTHESIA:   epidural  EBL:  Total I/O In: 2300 [I.V.:2300] Out: 950 [Urine:150; Blood:800]  BLOOD ADMINISTERED:none  DRAINS: Urinary Catheter (Foley)   LOCAL MEDICATIONS USED:  NONE  SPECIMEN:  Source of Specimen:  Placenta  DISPOSITION OF SPECIMEN:  Labor and delivery  COUNTS:  YES  TOURNIQUET:  * No tourniquets in log *  DICTATION: .Note written in EPIC  PLAN OF CARE: Admit to inpatient   PATIENT DISPOSITION:  PACU - hemodynamically stable.   Delay start of Pharmacological VTE agent (>24hrs) due to surgical blood loss or risk of bleeding: yes   Findings:  @BABYSEXEBC @ infant,  APGAR (1 MIN): 9   APGAR (5 MINS): 9   APGAR (10 MINS):    Normal uterus tubes and ovaries, normal placenta, three-vessel cord, clear amniotic fluid Female fetus, DOA position, cord pH 7.24  EBL: 700 cc Antibiotics:  3 g Ancef  Complications: none  Indications: This is a 26 y.o. year-old, G2 P0010  At [redacted]w[redacted]d admitted for oligohydramnios. It was unclear whether patient was ruptured or if she had oligohydramnios from placental insufficiency. Patient was noting leaking of fluid however this was not seen on exam. Grade 3 calcified placenta was noted on ultrasound. Fetal macrosomia was presumed. Patient was admitted for expectant management given early labor. After several hours of no further cervical change Pitocin was begun. Patient did  enter active labor and eventually changed from 1 cm to 3 cm 100% effaced. However the fetal vertex never descended into the pelvis and remained in the minus 3 position. An IUPC was placed to evaluate contraction strength and also to evaluate possible decelerations. With the help of the IUPC was clear that late decelerations were occurring. Initially these were mild with good recovery and the baby remained with good variability and with accelerations. Eventually the Pitocin needed to be turned off. Despite about one hour of the Pitocin off mild decelerations, late in timing persistent.. Patient then had prolonged late deceleration to the 90s for about 4 minutes. She received terbutaline and recovery of the fetal strip was noted. Patient made no further cervical change and specifically no further descent in the pelvis. Discussion was had with the patient regarding fetal well being as well as the remotefrom delivery and the concern for cephalopelvic disproportion given the narrow pelvis and a presumed large baby. Risks benefits and alternatives of the procedure were discussed with the patient who agreed to proceed with cesarean section.  Procedure:  After informed consent was obtained the patient was taken to the operating room where epidural anesthesia was found to be adequate.  She was prepped and draped in the normal sterile fashion in dorsal supine position with a leftward tilt.  A foley catheter was inserted sterilely into the bladder prior in the labor room. Gloves were changes and attention was turned to the patient's abdomen. A Pfannenstiel skin incision was made 2 cm above the pubic symphysis in the midline with  the scalpel.  Dissection was carried down with the Bovie cautery until the fascia was reached. The fascia was incised in the midline. The incision was extended laterally with the Mayo scissors. The inferior aspect of the fascial incision was grasped with the Coker clamps, elevated up and the  underlying rectus muscles were dissected off sharply. The superior aspect of the fascial incision was grasped with the Coker clamps elevated up and the underlying rectus muscles were dissected off sharply.  The peritoneum was entered bluntly. The peritoneal incision was extended superiorly and inferiorly with good visualization of the bladder. The bladder blade was inserted, the vesicouterine peritoneum was identified grasped with the pickups and entered sharply. The bladder flap was created digitally the bladder blade was reinserted. Palpation was done to assess the fetal position and the location of the uterine vessels. The lower segment of the uterus was incised sharply with the scalpel and extended superiorly and laterally with the bandage scissors. The infant also was grasped brought to the incision,  rotated and the infant was delivered with fundal pressure. The cord was clamped and cut. The infant was handed off to the waiting pediatrician. The placenta was expressed. The uterus was exteriorized. The uterus was cleared of all clots and debris. The uterine incision was repaired with 0 Vicryl in a running locked fashion.  A second layer of the same suture was used in an imbricating fashion to obtain excellent hemostasis.  The uterus was then returned to the abdomen, the gutters were cleared of all clots and debris. The uterine incision was reinspected and found to be hemostatic. The peritoneum was grasped and closed with 2-0 Vicryl in a running fashion. The cut muscle edges and the underside of the fascia were inspected and found to be hemostatic. The fascia was closed with 0 Vicryl in a single layer. The subcutaneous tissue was irrigated. Scarpa's layer was closed with a 2-0 plain gut suture. The skin was closed with staples. The patient tolerated the procedure well. Sponge lap and needle counts were correct x3 and patient was taken to the recovery room in a stable condition.  Kendal Ghazarian A. 08/17/2012  1:26 AM

## 2012-08-17 NOTE — Anesthesia Postprocedure Evaluation (Signed)
Anesthesia Post Note  Patient: Crystal [name]  Procedure(s) Performed: Procedure(s) (LRB): CESAREAN SECTION (N/A)  Anesthesia type: Epidural  Patient location: PACU  Post pain: Pain level controlled  Post assessment: Post-op Vital signs reviewed  Last Vitals:  Filed Vitals:   08/17/12 0005  BP:   Pulse:   Temp:   Resp: 20    Post vital signs: Reviewed  Level of consciousness: awake  Complications: No apparent anesthesia complications

## 2012-08-17 NOTE — Transfer of Care (Signed)
Immediate Anesthesia Transfer of Care Note  Patient: Crystal [name]  Procedure(s) Performed: Procedure(s) (LRB) with comments: CESAREAN SECTION (N/A) - Primary Cesarean Section   Patient Location: PACU  Anesthesia Type:Epidural  Level of Consciousness: awake  Airway & Oxygen Therapy: Patient Spontanous Breathing  Post-op Assessment: Report given to PACU RN and Post -op Vital signs reviewed and stable  Post vital signs: stable  Complications: No apparent anesthesia complications

## 2012-08-17 NOTE — Progress Notes (Signed)
Subjective: Postpartum Day 1: Primary Cesarean Delivery secondary to: presumed Macrasomnia Patient reports incisional pain, + flatus and no problems voiding.  +BM no complaints, up ad lib without syncope, Foley catheter remain in place  Patient has been slow to ambulate today. Pain well controlled with po meds, using motrin and percocet  BF - has decided to formula feed Mood stable, bonding well   Objective: Vital signs in last 24 hours: Temp:  [97.2 F (36.2 C)-99 F (37.2 C)] 99 F (37.2 C) (12/07 1030) Pulse Rate:  [82-125] 88  (12/07 1030) Resp:  [16-22] 18  (12/07 1030) BP: (97-137)/(46-87) 128/70 mmHg (12/07 1030) SpO2:  [97 %-100 %] 97 % (12/07 1030) Weight:  [136.986 kg (302 lb)] 136.986 kg (302 lb) (12/06 1940)  Physical Exam:  General: alert, cooperative and no distress Heart: RRR Lungs: CTAB Abdomen: BS x4, soft. Uterine Fundus: firm -1/u Incision: healing well, no significant drainage, no dehiscence, no significant erythema . Dressing to be removed after shower later today.  Lochia: appropriate, Rubra, min DVT Evaluation: No evidence of DVT seen on physical exam. Negative Homan's sign. No cords or calf tenderness. No significant calf/ankle edema.   Basename 08/17/12 0635 08/16/12 1305  HGB 10.0* 11.5*  HCT 30.7* 34.7*   Anemia of pregnancy  to commenced on Niferex 150 mg po daily  Assessment/Plan: Status post Cesarean section. Doing well postoperatively.  Continue current care. Encourage mobilization and deep breathing.   Janessa Mickle, CNM. 08/17/2012, 12:34 PM

## 2012-08-18 ENCOUNTER — Encounter (HOSPITAL_COMMUNITY)
Admission: AD | Disposition: A | Discharge status: Home / Self Care | Payer: Self-pay | Source: Ambulatory Visit | Attending: Obstetrics and Gynecology

## 2012-08-18 SURGERY — Surgical Case
Anesthesia: Epidural | Wound class: Clean Contaminated

## 2012-08-18 SURGICAL SUPPLY — 33 items
CLOTH BEACON ORANGE TIMEOUT ST (SAFETY) ×2 IMPLANT
CONTAINER PREFILL 10% NBF 15ML (MISCELLANEOUS) IMPLANT
DRSG OPSITE POSTOP 4X10 (GAUZE/BANDAGES/DRESSINGS) IMPLANT
DRSG OPSITE POSTOP 4X12 (GAUZE/BANDAGES/DRESSINGS) ×2 IMPLANT
DURAPREP 26ML APPLICATOR (WOUND CARE) ×2 IMPLANT
ELECT REM PT RETURN 9FT ADLT (ELECTROSURGICAL) ×2
ELECTRODE REM PT RTRN 9FT ADLT (ELECTROSURGICAL) ×1 IMPLANT
EXTRACTOR VACUUM KIWI (MISCELLANEOUS) IMPLANT
EXTRACTOR VACUUM M CUP 4 TUBE (SUCTIONS) IMPLANT
GLOVE BIO SURGEON STRL SZ 6.5 (GLOVE) ×4 IMPLANT
GLOVE BIOGEL PI IND STRL 7.0 (GLOVE) ×2 IMPLANT
GLOVE BIOGEL PI INDICATOR 7.0 (GLOVE) ×2
GOWN PREVENTION PLUS LG XLONG (DISPOSABLE) ×6 IMPLANT
KIT ABG SYR 3ML LUER SLIP (SYRINGE) IMPLANT
NDL HYPO 25X5/8 SAFETYGLIDE (NEEDLE) IMPLANT
NEEDLE HYPO 25X5/8 SAFETYGLIDE (NEEDLE) IMPLANT
NS IRRIG 1000ML POUR BTL (IV SOLUTION) ×2 IMPLANT
PACK C SECTION WH (CUSTOM PROCEDURE TRAY) ×2 IMPLANT
PAD OB MATERNITY 4.3X12.25 (PERSONAL CARE ITEMS) IMPLANT
SLEEVE SCD COMPRESS KNEE MED (MISCELLANEOUS) IMPLANT
STAPLER VISISTAT 35W (STAPLE) ×1 IMPLANT
STRIP CLOSURE SKIN 1/2X4 (GAUZE/BANDAGES/DRESSINGS) IMPLANT
SUT MON AB 4-0 PS1 27 (SUTURE) IMPLANT
SUT PLAIN 0 NONE (SUTURE) IMPLANT
SUT PLAIN 2 0 XLH (SUTURE) IMPLANT
SUT VIC AB 0 CT1 36 (SUTURE) ×2 IMPLANT
SUT VIC AB 0 CTX 36 (SUTURE) ×6
SUT VIC AB 0 CTX36XBRD ANBCTRL (SUTURE) ×3 IMPLANT
SUT VIC AB 2-0 CT1 27 (SUTURE) ×2
SUT VIC AB 2-0 CT1 TAPERPNT 27 (SUTURE) ×1 IMPLANT
TOWEL OR 17X24 6PK STRL BLUE (TOWEL DISPOSABLE) ×4 IMPLANT
TRAY FOLEY CATH 14FR (SET/KITS/TRAYS/PACK) IMPLANT
WATER STERILE IRR 1000ML POUR (IV SOLUTION) ×2 IMPLANT

## 2012-08-18 NOTE — Progress Notes (Signed)
Subjective: Postpartum Day 2: Cesarean Delivery secondary to: FTP and FT d  Patient reports incisional pain, tolerating PO, + flatus and no problems voiding.  +BM no complaints, up ad lib without syncope Pain well controlled with po meds, using motrin and percocet  Bottle feeding Mood stable, bonding well.  Objective: Vital signs in last 24 hours: Temp:  [98 F (36.7 C)-99 F (37.2 C)] 98.1 F (36.7 C) (12/08 0631) Pulse Rate:  [88-99] 98  (12/08 0631) Resp:  [18-20] 18  (12/08 0631) BP: (106-128)/(67-70) 108/70 mmHg (12/08 0631) SpO2:  [97 %-99 %] 97 % (12/08 0015)  Physical Exam:  General: alert, cooperative and no distress Breasts: N/T Heart: RRR Lungs: CTAB Abdomen: BS x4, Flatus Uterine Fundus: firm Incision: healing well, no significant drainage, no dehiscence, no significant erythema, patient did not have a shower yesterday and need to shower today. No take down dressing today and apply dry dressing. Lochia: appropriate, min. DVT Evaluation: No evidence of DVT seen on physical exam. Negative Homan's sign. No cords or calf tenderness. No significant calf/ankle edema. Needs to ambulate.   Basename 08/17/12 0635 08/16/12 1305  HGB 10.0* 11.5*  HCT 30.7* 34.7*   Anemia of pregnancy Continues on po iron Supplement.  Assessment/Plan: Status post Cesarean section. Doing well postoperatively.  Continue current care. Baby needs circumcision prior to discharge tomorrow.   Dekker Verga, CNM. 08/18/2012, 9:30 AM

## 2012-08-19 ENCOUNTER — Encounter (HOSPITAL_COMMUNITY): Payer: Self-pay

## 2012-08-19 NOTE — Progress Notes (Signed)
Subjective: POD# 2 newborn birth early AM 12/7 Information for the patient's newborn:  Crystal, [name] [[id number]]  female  / circ completed  Reports feeling crampy but well. Feeding: breast and bottle Patient reports tolerating PO.  Breast symptoms: desires to try BFing, successful latch last night. Pain controlled with Motrin and Percocet. Denies HA/SOB/C/P/N/V/dizziness. Flatus present. She reports vaginal bleeding as normal, without clots.  She is ambulating, urinating without difficult.     Objective:   VS:  Filed Vitals:   08/18/12 0631 08/18/12 1449 08/18/12 2130 08/19/12 0619  BP: 108/70 126/70 124/75 111/71  Pulse: 98 97 110 99  Temp: 98.1 F (36.7 C) 98.5 F (36.9 C) 98.1 F (36.7 C) 98 F (36.7 C)  TempSrc: Oral Oral Oral Oral  Resp: 18 16 18 18   Height:      Weight:      SpO2:        No intake or output data in the 24 hours ending 08/19/12 1035      Basename 08/17/12 0635 08/16/12 1305  WBC 16.2* 11.3*  HGB 10.0* 11.5*  HCT 30.7* 34.7*  PLT 234 296     Blood type: --/--/B POS (12/06 1321)  Rubella: Immune (05/01 0000)   TDaP and flu vaccine done   Physical Exam:  General: alert, cooperative and no distress CV: Regular rate and rhythm Resp: clear Abdomen: soft, nontender, normal bowel sounds, + panus Incision: dry, intact and opsite dressing in place, minimal serosanguinos dressing Uterine Fundus: firm, below umbilicus, nontender Lochia: minimal Ext: edema +2 pedal and Homans sign is negative, no sign of DVT      Assessment/Plan: 26 y.o.   POD# 2. [id number]                  Active Problems:  Status post primary low transverse cesarean section - 12/7  Anemia of mother in pregnancy, delivered  Postpartum care following cesarean delivery  Doing well, stable.    Asymptomatic anemia Ambulate Routine post-op care Continue inpatient today, LC support for breastfeeding Anticipate discharge home in AM.   Crystal [name] 08/19/2012, 10:35 AM

## 2012-08-20 MED ORDER — OXYCODONE-ACETAMINOPHEN 5-325 MG PO TABS: 1.0000 | ORAL_TABLET | ORAL | Status: DC | PRN

## 2012-08-20 MED ORDER — POLYSACCHARIDE IRON COMPLEX 150 MG PO CAPS: 150.0000 mg | ORAL_CAPSULE | Freq: Every day | ORAL | Status: DC

## 2012-08-20 MED ORDER — IBUPROFEN 600 MG PO TABS: 600.0000 mg | ORAL_TABLET | Freq: Four times a day (QID) | ORAL | Status: DC

## 2012-08-20 NOTE — Plan of Care (Signed)
Problem: Discharge Progression Outcomes Goal: Remove staples per MD order Outcome: Progressing Remove in office on Friday 08/23/12

## 2012-08-20 NOTE — Discharge Summary (Signed)
POSTOPERATIVE DISCHARGE SUMMARY:  Patient ID: Crystal [name] MRN: [id number] DOB/AGE: 1986-01-22 26 y.o.  Admit date: 08/16/2012 Admission Diagnoses: post-dates at 85 3/7 / latent labor / LGA / borderline pelvis / + GBS carrier    Discharge date:  08/20/2012 Discharge Diagnoses: POD 3 s/p primary cesarean section for arrest of dilation & descent in labor / no-reassuring FHR tracing  Prenatal history: G2P1011   EDC : 08/14/2012, by Last Menstrual Period  Prenatal care at [facility]  Primary provider : Mody Prenatal course complicated by postdates / LGA / borderline pelvis / + GBS  Prenatal Labs: ABO, Rh: B (05/01 0000) positive Antibody: NEG (12/06 1321) Rubella: Immune (05/01 0000)  RPR: NON REACTIVE (12/06 1305)  HBsAg: Negative (05/01 0000)  HIV: Non-reactive (05/01 0000)  GBS: Positive (11/06 0000)  1 hr Glucola : NL   Medical / Surgical History :  Past medical history:  Past Medical History  Diagnosis Date  . Allergy   . PCOS (polycystic ovarian syndrome)     No radiological confirmation  . Irregular menses   . Hx of chlamydia infection   . H/O varicella   . History of PID   . Obese   . Status post primary low transverse cesarean section - 12/7 08/17/2012    Presumed macrosomia - FT progress with NRFT   . Postpartum care following cesarean delivery 08/19/2012    Past surgical history:  Past Surgical History  Procedure Date  . No past surgeries   . Cesarean section 08/18/2012    Procedure: CESAREAN SECTION;  Surgeon: Tresa Endo A. Ernestina Penna, MD;  Location: [facility];  Service: Obstetrics;  Laterality: N/A;  Primary Cesarean Section     Family History:  Family History  Problem Relation Age of Onset  . Heart disease Mother   . Hypertension Mother   . Diabetes Mother   . Asthma Mother   . Heart disease Father   . Hypertension Father   . Diabetes Father   . Asthma Father   . Diabetes Maternal Grandmother   . Hypertension Maternal Grandmother   .  Cancer Maternal Grandmother     urethral, lung    Social History:  reports that she has quit smoking. Her smoking use included Cigarettes. She has never used smokeless tobacco. She reports that she drinks alcohol. She reports that she does not use illicit drugs.   Allergies: Review of patient's allergies indicates no known allergies.    Current Medications at time of admission:  Prior to Admission medications   Not on File    Intrapartum Course: admit in latent labor / epidural for pain management / AROM and pitocin augmentation / arrest of labor dilation and descent / non-reassuring FHR tracing  Procedures: Cesarean section delivery of Female newborn by Dr Ernestina Penna  See operative report for further details APGAR (1 MIN): 9   APGAR (5 MINS): 9    Baby weight: 9-6  Postoperative / postpartum course: uneventful  Physical Exam:   VSS: Temp:  [97.9 F (36.6 C)-98.7 F (37.1 C)] 98.7 F (37.1 C) (12/10 0540) Pulse Rate:  [89-101] 97  (12/10 0540) Resp:  [18-19] 19  (12/10 0540) BP: (117-125)/(74-83) 125/83 mmHg (12/10 0540)  LABS: No results found for this basename: WBC:2,HGB:2,PLT:2 in the last 72 hours  General: NAD / ambulatory / bonding well with baby Heart:RR Lungs:Clear Abdomen:Soft / non-tender / bowel sounds active Extremities: Trace edema  Incision:  approximated with staples / no erythema / no ecchymosis /  no drainage Staples: intact - honeycomb dressing intact for removal after POD 5  Discharge Instructions:  Discharged Condition: good Activity: pelvic rest and postoperative restrictions x 2 weeks Diet: routine Medications: PNV, Ibuprofen, Iron and Percocet   Medication List    Notice       You have not been prescribed any medications.         Postpartum Instructions: [facility] discharge booklet - instructions reviewed Discharge to: Home  Follow up :   [facility] in 6 weeks for routine postpartum visit                       Interval visit at [facility]  In 3days for staple removal   Signed: Marlinda Mike CNM, MSN 08/20/2012, 10:18 AM

## 2012-08-20 NOTE — Progress Notes (Signed)
POSTOPERATIVE DAY # 3 S/P cesarean section   S:         Reports feeling well - ready to go home             Tolerating po intake / no nausea / no vomiting / + flatus / +  BM             Bleeding is light             Pain controlled with motrin and percocet             Up ad lib / ambulatory/ voiding QS  Newborn breast feeding  / Circumcision done   O:  VS: BP 125/83  Pulse 97  Temp 98.7 F (37.1 C) (Oral)  Resp 19  Ht 5\' 6"  (1.676 m)  Wt 136.986 kg (302 lb)  BMI 48.74 kg/m2  SpO2 97%  LMP 11/08/2011  Breastfeeding? Unknown    Physical Exam:             Alert and Oriented X3  Lungs: Clear and unlabored  Heart: regular rate and rhythm / no mumurs  Abdomen: soft, non-tender, non-distended, active BS             Fundus: firm, non-tender, U-1             Dressing intact - honeycomb dressing             Incision:  approximated with staples / no erythema / no ecchymosis / no drainage  Perineum: no edema  Lochia: light  Extremities: trace pedal edema, no calf pain or tenderness, negative Homans  A:        POD # 3 S/P cesarean section            Stable for discharge  P:        Routine postoperative care              Discharge to home             [facility] booklet - instructions reviewed     Marlinda Mike CNM, MSN 08/20/2012, 10:15 AM

## 2012-08-21 ENCOUNTER — Inpatient Hospital Stay (HOSPITAL_COMMUNITY): Admission: RE | Admit: 2012-08-21 | Payer: 59 | Source: Ambulatory Visit

## 2013-11-05 ENCOUNTER — Emergency Department (HOSPITAL_COMMUNITY)
Admission: EM | Admit: 2013-11-05 | Discharge: 2013-11-05 | Disposition: A | Discharge status: Home / Self Care | Payer: 59 | Source: Home / Self Care | Attending: Family Medicine | Admitting: Family Medicine

## 2013-11-05 ENCOUNTER — Other Ambulatory Visit (HOSPITAL_COMMUNITY)
Admission: RE | Admit: 2013-11-05 | Discharge: 2013-11-05 | Disposition: A | Discharge status: Home / Self Care | Payer: 59 | Source: Ambulatory Visit | Attending: Family Medicine | Admitting: Family Medicine

## 2013-11-05 ENCOUNTER — Encounter (HOSPITAL_COMMUNITY): Payer: Self-pay | Admitting: Emergency Medicine

## 2013-11-05 DIAGNOSIS — N72 Inflammatory disease of cervix uteri: Secondary | ICD-10-CM

## 2013-11-05 DIAGNOSIS — B3731 Acute candidiasis of vulva and vagina: Secondary | ICD-10-CM

## 2013-11-05 DIAGNOSIS — N760 Acute vaginitis: Secondary | ICD-10-CM | POA: Insufficient documentation

## 2013-11-05 DIAGNOSIS — Z113 Encounter for screening for infections with a predominantly sexual mode of transmission: Secondary | ICD-10-CM | POA: Insufficient documentation

## 2013-11-05 DIAGNOSIS — B373 Candidiasis of vulva and vagina: Secondary | ICD-10-CM

## 2013-11-05 LAB — POCT I-STAT, CHEM 8
BUN: 10 mg/dL (ref 6–23)
Calcium, Ion: 1.14 mmol/L (ref 1.12–1.23)
Chloride: 104 mEq/L (ref 96–112)
Creatinine, Ser: 1 mg/dL (ref 0.50–1.10)
Glucose, Bld: 93 mg/dL (ref 70–99)
HCT: 41 % (ref 36.0–46.0)
Hemoglobin: 13.9 g/dL (ref 12.0–15.0)
Potassium: 3.4 mEq/L — ABNORMAL LOW (ref 3.7–5.3)
Sodium: 142 mEq/L (ref 137–147)
TCO2: 26 mmol/L (ref 0–100)

## 2013-11-05 LAB — POCT URINALYSIS DIP (DEVICE)
Bilirubin Urine: NEGATIVE
Glucose, UA: NEGATIVE mg/dL
Hgb urine dipstick: NEGATIVE
Ketones, ur: NEGATIVE mg/dL
Nitrite: NEGATIVE
Protein, ur: 100 mg/dL — AB
Specific Gravity, Urine: >=1.03 (ref 1.005–1.030)
Urobilinogen, UA: 0.2 mg/dL (ref 0.0–1.0)
pH: 6 (ref 5.0–8.0)

## 2013-11-05 LAB — POCT PREGNANCY, URINE: Preg Test, Ur: NEGATIVE

## 2013-11-05 MED ORDER — CEFTRIAXONE SODIUM 250 MG IJ SOLR
INTRAMUSCULAR | Status: AC
  Filled 2013-11-05: qty 250

## 2013-11-05 MED ORDER — CEFTRIAXONE SODIUM 250 MG IJ SOLR
250.0000 mg | Freq: Once | INTRAMUSCULAR | Status: AC
  Administered 2013-11-05: 250 mg via INTRAMUSCULAR

## 2013-11-05 MED ORDER — AZITHROMYCIN 250 MG PO TABS
ORAL_TABLET | ORAL | Status: AC
  Filled 2013-11-05: qty 4

## 2013-11-05 MED ORDER — METRONIDAZOLE 500 MG PO TABS: 500.0000 mg | ORAL_TABLET | Freq: Two times a day (BID) | ORAL | Status: DC

## 2013-11-05 MED ORDER — AZITHROMYCIN 250 MG PO TABS
1000.0000 mg | ORAL_TABLET | Freq: Once | ORAL | Status: AC
  Administered 2013-11-05: 1000 mg via ORAL

## 2013-11-05 MED ORDER — FLUCONAZOLE 150 MG PO TABS: 150.0000 mg | ORAL_TABLET | Freq: Every day | ORAL | Status: DC

## 2013-11-05 NOTE — ED Provider Notes (Signed)
CSN: [id number]     Arrival date & time 11/05/13  1015 History   First MD Initiated Contact with Patient 11/05/13 1108     Chief Complaint  Patient presents with  . URI     (Consider location/radiation/quality/duration/timing/severity/associated sxs/prior Treatment) Patient is a 28 y.o. female presenting with URI and vaginal discharge. The history is provided by the patient. No language interpreter was used.  URI Presenting symptoms: congestion and rhinorrhea   Progression:  Worsening Vaginal Discharge Quality:  Yellow Severity:  Moderate Onset quality:  Gradual Duration:  5 days Timing:  Constant Progression:  Worsening Chronicity:  New Relieved by:  Nothing Worsened by:  Nothing tried Ineffective treatments:  None tried   Past Medical History  Diagnosis Date  . Allergy   . PCOS (polycystic ovarian syndrome)     No radiological confirmation  . Irregular menses   . Hx of chlamydia infection   . H/O varicella   . History of PID   . Obese   . Status post primary low transverse cesarean section - 12/7 08/17/2012    Presumed macrosomia - FT progress with NRFT   . Postpartum care following cesarean delivery 08/19/2012   Past Surgical History  Procedure Laterality Date  . No past surgeries    . Cesarean section  08/18/2012    Procedure: CESAREAN SECTION;  Surgeon: Tresa Endo A. Ernestina Penna, MD;  Location: [facility];  Service: Obstetrics;  Laterality: N/A;  Primary Cesarean Section    Family History  Problem Relation Age of Onset  . Heart disease Mother   . Hypertension Mother   . Diabetes Mother   . Asthma Mother   . Heart disease Father   . Hypertension Father   . Diabetes Father   . Asthma Father   . Diabetes Maternal Grandmother   . Hypertension Maternal Grandmother   . Cancer Maternal Grandmother     urethral, lung   History  Substance Use Topics  . Smoking status: Former Smoker    Types: Cigarettes  . Smokeless tobacco: Never Used  . Alcohol Use: Yes   OB History    Grav Para Term Preterm Abortions TAB SAB Ect Mult Living   2 1 1  0 1  1   1      Review of Systems  HENT: Positive for congestion and rhinorrhea.   Genitourinary: Positive for vaginal discharge.  All other systems reviewed and are negative.      Allergies  Review of patient's allergies indicates no known allergies.  Home Medications   Current Outpatient Rx  Name  Route  Sig  Dispense  Refill  . ibuprofen (ADVIL,MOTRIN) 600 MG tablet   Oral   Take 1 tablet (600 mg total) by mouth every 6 (six) hours.   30 tablet   0   . iron polysaccharides (NIFEREX) 150 MG capsule   Oral   Take 1 capsule (150 mg total) by mouth daily.   30 capsule   0   . oxyCODONE-acetaminophen (PERCOCET/ROXICET) 5-325 MG per tablet   Oral   Take 1-2 tablets by mouth every 4 (four) hours as needed (moderate - severe pain).   30 tablet   0    BP 131/85  Pulse 103  Temp(Src) 99.7 F (37.6 C) (Oral)  Resp 17  SpO2 99%  LMP 10/24/2013 Physical Exam  Nursing note and vitals reviewed. Constitutional: She appears well-developed and well-nourished.  HENT:  Head: Normocephalic and atraumatic.  Eyes: Pupils are equal, round, and reactive to light.  Neck: Normal range of motion. Neck supple.  Cardiovascular: Normal rate.   Pulmonary/Chest: Effort normal.  Abdominal: Soft.  Genitourinary: Vaginal discharge found.  Musculoskeletal: Normal range of motion.  Skin: Skin is warm.  Psychiatric: She has a normal mood and affect.    ED Course  Procedures (including critical care time) Labs Review Labs Reviewed  POCT URINALYSIS DIP (DEVICE) - Abnormal; Notable for the following:    Protein, ur 100 (*)    Leukocytes, UA TRACE (*)    All other components within normal limits  POCT I-STAT, CHEM 8 - Abnormal; Notable for the following:    Potassium 3.4 (*)    All other components within normal limits  POCT PREGNANCY, URINE  CERVICOVAGINAL ANCILLARY ONLY   Imaging Review No results  found.    MDM   Final diagnoses:  Yeast vaginitis  Cervicitis    Rocephin, zithromax, diflucan and metronidazole    Elson AreasLeslie K Stephania Macfarlane, PA-C 11/05/13 1244

## 2013-11-05 NOTE — Discharge Instructions (Signed)
Candida Infection, Adult A candida infection (also called yeast, fungus and Monilia infection) is an overgrowth of yeast that can occur anywhere on the body. A yeast infection commonly occurs in warm, moist body areas. Usually, the infection remains localized but can spread to become a systemic infection. A yeast infection may be a sign of a more severe disease such as diabetes, leukemia, or AIDS. A yeast infection can occur in both men and women. In women, Candida vaginitis is a vaginal infection. It is one of the most common causes of vaginitis. Men usually do not have symptoms or know they have an infection until other problems develop. Men may find out they have a yeast infection because their sex partner has a yeast infection. Uncircumcised men are more likely to get a yeast infection than circumcised men. This is because the uncircumcised glans is not exposed to air and does not remain as dry as that of a circumcised glans. Older adults may develop yeast infections around dentures. CAUSES  Women  Antibiotics.  Steroid medication taken for a long time.  Being overweight (obese).  Diabetes.  Poor immune condition.  Certain serious medical conditions.  Immune suppressive medications for organ transplant patients.  Chemotherapy.  Pregnancy.  Menstration.  Stress and fatigue.  Intravenous drug use.  Oral contraceptives.  Wearing tight-fitting clothes in the crotch area.  Catching it from a sex partner who has a yeast infection.  Spermicide.  Intravenous, urinary, or other catheters. Men  Catching it from a sex partner who has a yeast infection.  Having oral or anal sex with a person who has the infection.  Spermicide.  Diabetes.  Antibiotics.  Poor immune system.  Medications that suppress the immune system.  Intravenous drug use.  Intravenous, urinary, or other catheters. SYMPTOMS  Women  Thick, white vaginal discharge.  Vaginal itching.  Redness and  swelling in and around the vagina.  Irritation of the lips of the vagina and perineum.  Blisters on the vaginal lips and perineum.  Painful sexual intercourse.  Low blood sugar (hypoglycemia).  Painful urination.  Bladder infections.  Intestinal problems such as constipation, indigestion, bad breath, bloating, increase in gas, diarrhea, or loose stools. Men  Men may develop intestinal problems such as constipation, indigestion, bad breath, bloating, increase in gas, diarrhea, or loose stools.  Dry, cracked skin on the penis with itching or discomfort.  Jock itch.  Dry, flaky skin.  Athlete's foot.  Hypoglycemia. DIAGNOSIS  Women  A history and an exam are performed.  The discharge may be examined under a microscope.  A culture may be taken of the discharge. Men  A history and an exam are performed.  Any discharge from the penis or areas of cracked skin will be looked at under the microscope and cultured.  Stool samples may be cultured. TREATMENT  Women  Vaginal antifungal suppositories and creams.  Medicated creams to decrease irritation and itching on the outside of the vagina.  Warm compresses to the perineal area to decrease swelling and discomfort.  Oral antifungal medications.  Medicated vaginal suppositories or cream for repeated or recurrent infections.  Wash and dry the irritation areas before applying the cream.  Eating yogurt with lactobacillus may help with prevention and treatment.  Sometimes painting the vagina with gentian violet solution may help if creams and suppositories do not work. Men  Antifungal creams and oral antifungal medications.  Sometimes treatment must continue for 30 days after the symptoms go away to prevent recurrence. HOME CARE   INSTRUCTIONS  Women  Use cotton underwear and avoid tight-fitting clothing.  Avoid colored, scented toilet paper and deodorant tampons or pads.  Do not douche.  Keep your diabetes  under control.  Finish all the prescribed medications.  Keep your skin clean and dry.  Consume milk or yogurt with lactobacillus active culture regularly. If you get frequent yeast infections and think that is what the infection is, there are over-the-counter medications that you can get. If the infection does not show healing in 3 days, talk to your caregiver.  Tell your sex partner you have a yeast infection. Your partner may need treatment also, especially if your infection does not clear up or recurs. Men  Keep your skin clean and dry.  Keep your diabetes under control.  Finish all prescribed medications.  Tell your sex partner that you have a yeast infection so they can be treated if necessary. SEEK MEDICAL CARE IF:   Your symptoms do not clear up or worsen in one week after treatment.  You have an oral temperature above 102 F (38.9 C).  You have trouble swallowing or eating for a prolonged time.  You develop blisters on and around your vagina.  You develop vaginal bleeding and it is not your menstrual period.  You develop abdominal pain.  You develop intestinal problems as mentioned above.  You get weak or lightheaded.  You have painful or increased urination.  You have pain during sexual intercourse. MAKE SURE YOU:   Understand these instructions.  Will watch your condition.  Will get help right away if you are not doing well or get worse. Document Released: 10/05/2004 Document Revised: 11/20/2011 Document Reviewed: 01/17/2010 [facility] Patient Information 2014 [place], [place].  Cervicitis Cervicitis is a soreness and swelling (inflammation) of the cervix. Your cervix is located at the bottom of your uterus. It opens up to the vagina. CAUSES   Sexually transmitted infections (STIs).   Allergic reaction.   Medicines or birth control devices that are put in the vagina.   Injury to the cervix.   Bacterial infections.  RISK FACTORS You are at  greater risk if you:  Have unprotected sexual intercourse.  Have sexual intercourse with many partners.  Began sexual intercourse at an early age.  Have a history of STIs. SYMPTOMS  There may be no symptoms. If symptoms occur, they may include:   Grey, white, yellow, or bad-smelling vaginal discharge.   Pain or itching of the area outside the vagina.   Painful sexual intercourse.   Lower abdominal or lower back pain, especially during intercourse.   Frequent urination.   Abnormal vaginal bleeding between periods, after sexual intercourse, or after menopause.   Pressure or a heavy feeling in the pelvis.  DIAGNOSIS  Diagnosis is made after a pelvic exam. Other tests may include:   Examination of any discharge under a microscope (wet prep).   A Pap test.  TREATMENT  Treatment will depend on the cause of cervicitis. If it is caused by an STI, both you and your partner will need to be treated. Antibiotic medicines will be given.  HOME CARE INSTRUCTIONS   Do not have sexual intercourse until your health care provider says it is okay.   Do not have sexual intercourse until your partner has been treated, if your cervicitis is caused by an STI.   Take your antibiotics as directed. Finish them even if you start to feel better.  SEEK MEDICAL CARE IF:  Your symptoms come back.  You have a fever.  MAKE SURE YOU:   Understand these instructions.  Will watch your condition.  Will get help right away if you are not doing well or get worse. Document Released: 08/28/2005 Document Revised: 04/30/2013 Document Reviewed: 02/19/2013 [facility] Patient Information 2014 [place].

## 2013-11-05 NOTE — ED Notes (Signed)
Pt  Actually  Has   2   Symptoms    - she  Has  Symptoms  Of      Congestion  Nosebleeds          And   Sweats   X  5  Days     As  Well  As  A  Vaginal  Discharge   Which  She  Tried  To  tx  With  Monistat         That  Did  Not  Resolve    No  Bleeding  No  abd  Pain  Ambulates  Upright  With a   Steady  Fluid  gait

## 2013-11-06 LAB — CERVICOVAGINAL ANCILLARY ONLY
Chlamydia: NEGATIVE
Neisseria Gonorrhea: NEGATIVE
Wet Prep (BD Affirm): NEGATIVE
Wet Prep (BD Affirm): NEGATIVE
Wet Prep (BD Affirm): POSITIVE — AB

## 2013-11-06 NOTE — ED Notes (Signed)
GC/Chlamydia neg., Affirm: Candida and Trich neg., Gardnerella pos. Pt. adequately treated with Flagyl. Crystal MoselleYork, Crystal [name] 11/06/2013

## 2013-11-07 NOTE — ED Provider Notes (Addendum)
Medical screening examination/treatment/procedure(s) were performed by a resident physician or non-physician practitioner and as the supervising physician I was immediately available for consultation/collaboration.  Clementeen GrahamEvan Aryaman Haliburton, MD   Rodolph BongEvan S Jeniyah Menor, MD 11/07/13 [phone]  Rodolph BongEvan S Ashwin Tibbs, MD 11/07/13 [phone]

## 2013-11-08 ENCOUNTER — Emergency Department (HOSPITAL_COMMUNITY)
Admission: EM | Admit: 2013-11-08 | Discharge: 2013-11-08 | Disposition: A | Discharge status: Home / Self Care | Payer: 59 | Attending: Emergency Medicine | Admitting: Emergency Medicine

## 2013-11-08 ENCOUNTER — Encounter (HOSPITAL_COMMUNITY): Payer: Self-pay | Admitting: Emergency Medicine

## 2013-11-08 DIAGNOSIS — R519 Headache, unspecified: Secondary | ICD-10-CM

## 2013-11-08 DIAGNOSIS — R51 Headache: Secondary | ICD-10-CM | POA: Insufficient documentation

## 2013-11-08 DIAGNOSIS — Z87891 Personal history of nicotine dependence: Secondary | ICD-10-CM | POA: Insufficient documentation

## 2013-11-08 DIAGNOSIS — Z792 Long term (current) use of antibiotics: Secondary | ICD-10-CM | POA: Insufficient documentation

## 2013-11-08 DIAGNOSIS — Z8742 Personal history of other diseases of the female genital tract: Secondary | ICD-10-CM | POA: Insufficient documentation

## 2013-11-08 DIAGNOSIS — Z8619 Personal history of other infectious and parasitic diseases: Secondary | ICD-10-CM | POA: Insufficient documentation

## 2013-11-08 DIAGNOSIS — E669 Obesity, unspecified: Secondary | ICD-10-CM | POA: Insufficient documentation

## 2013-11-08 DIAGNOSIS — Z79899 Other long term (current) drug therapy: Secondary | ICD-10-CM | POA: Insufficient documentation

## 2013-11-08 MED ORDER — METOCLOPRAMIDE HCL 5 MG/ML IJ SOLN
10.0000 mg | Freq: Once | INTRAMUSCULAR | Status: AC
  Administered 2013-11-08: 10 mg via INTRAVENOUS
  Filled 2013-11-08: qty 2

## 2013-11-08 MED ORDER — KETOROLAC TROMETHAMINE 15 MG/ML IJ SOLN
30.0000 mg | Freq: Once | INTRAMUSCULAR | Status: AC
  Administered 2013-11-08: 30 mg via INTRAVENOUS
  Filled 2013-11-08: qty 2

## 2013-11-08 MED ORDER — DIPHENHYDRAMINE HCL 50 MG/ML IJ SOLN
12.5000 mg | Freq: Once | INTRAMUSCULAR | Status: AC
  Administered 2013-11-08: 12.5 mg via INTRAVENOUS
  Filled 2013-11-08: qty 1

## 2013-11-08 MED ORDER — SODIUM CHLORIDE 0.9 % IV BOLUS (SEPSIS)
1000.0000 mL | Freq: Once | INTRAVENOUS | Status: AC
  Administered 2013-11-08: 1000 mL via INTRAVENOUS

## 2013-11-08 MED ORDER — OXYCODONE-ACETAMINOPHEN 5-325 MG PO TABS: 1.0000 | ORAL_TABLET | ORAL | Status: DC | PRN

## 2013-11-08 MED ORDER — PROMETHAZINE HCL 25 MG PO TABS: 25.0000 mg | ORAL_TABLET | Freq: Four times a day (QID) | ORAL | Status: DC | PRN

## 2013-11-08 NOTE — ED Notes (Addendum)
Pt reports that she has had intermittent nosebleeds for the past two weeks, reports hx of nosebleeds, pt also states she has had a constant headache for the past week. Reports taking at home Advil for the pain without relief. States that she has not had a headache this severe in the past.  Pt also states that the HA makes her dizzy. Reports she was seen at Urgent Care for the same symptoms on 2/25 and told to follow up with her PCP. Pt a&o x4, ambulatory to triage.

## 2013-11-08 NOTE — Discharge Instructions (Signed)
Medication for pain and nausea. Increase fluids

## 2013-11-08 NOTE — ED Provider Notes (Signed)
CSN: [id number]     Arrival date & time 11/08/13  1921 History   First MD Initiated Contact with Patient 11/08/13 1946     Chief Complaint  Patient presents with  . Headache     (Consider location/radiation/quality/duration/timing/severity/associated sxs/prior Treatment) HPI.... Sharp generalized headache for one week.  Patient has taken ibuprofen without success.   She does not typically get headaches. No fever, chills, neuro deficits, stiff neck. Severity is mild to moderate.  Past Medical History  Diagnosis Date  . Allergy   . PCOS (polycystic ovarian syndrome)     No radiological confirmation  . Irregular menses   . Hx of chlamydia infection   . H/O varicella   . History of PID   . Obese   . Status post primary low transverse cesarean section - 12/7 08/17/2012    Presumed macrosomia - FT progress with NRFT   . Postpartum care following cesarean delivery 08/19/2012   Past Surgical History  Procedure Laterality Date  . No past surgeries    . Cesarean section  08/18/2012    Procedure: CESAREAN SECTION;  Surgeon: Tresa EndoKelly A. Ernestina PennaFogleman, MD;  Location: [facility];  Service: Obstetrics;  Laterality: N/A;  Primary Cesarean Section    Family History  Problem Relation Age of Onset  . Heart disease Mother   . Hypertension Mother   . Diabetes Mother   . Asthma Mother   . Heart disease Father   . Hypertension Father   . Diabetes Father   . Asthma Father   . Diabetes Maternal Grandmother   . Hypertension Maternal Grandmother   . Cancer Maternal Grandmother     urethral, lung   History  Substance Use Topics  . Smoking status: Former Smoker    Types: Cigarettes  . Smokeless tobacco: Never Used  . Alcohol Use: Yes   OB History   Grav Para Term Preterm Abortions TAB SAB Ect Mult Living   2 1 1  0 1  1   1      Review of Systems  All other systems reviewed and are negative.      Allergies  Review of patient's allergies indicates no known allergies.  Home Medications    Current Outpatient Rx  Name  Route  Sig  Dispense  Refill  . fluconazole (DIFLUCAN) 150 MG tablet   Oral   Take 1 tablet (150 mg total) by mouth daily.   1 tablet   1   . ibuprofen (ADVIL,MOTRIN) 200 MG tablet   Oral   Take 800 mg by mouth every 6 (six) hours as needed for headache or moderate pain.         . medroxyPROGESTERone (PROVERA) 5 MG tablet   Oral   Take 5 mg by mouth as directed. Take 1 tab daily for 7 days at the beginning of each month.         . metroNIDAZOLE (FLAGYL) 500 MG tablet   Oral   Take 1 tablet (500 mg total) by mouth 2 (two) times daily.   14 tablet   0   . phentermine 37.5 MG capsule   Oral   Take 37.5 mg by mouth every morning.         Marland Kitchen. oxyCODONE-acetaminophen (PERCOCET) 5-325 MG per tablet   Oral   Take 1 tablet by mouth every 4 (four) hours as needed.   15 tablet   0   . promethazine (PHENERGAN) 25 MG tablet   Oral   Take 1 tablet (25 mg  total) by mouth every 6 (six) hours as needed for nausea.   15 tablet   0    BP 131/69  Pulse 93  Temp(Src) 99.1 F (37.3 C) (Oral)  Resp 16  Ht 5\' 6"  (1.676 m)  Wt 302 lb (136.986 kg)  BMI 48.77 kg/m2  SpO2 100%  LMP 10/24/2013 Physical Exam  Nursing note and vitals reviewed. Constitutional: She is oriented to person, place, and time.  obese  HENT:  Head: Normocephalic and atraumatic.  Eyes: Conjunctivae and EOM are normal. Pupils are equal, round, and reactive to light.  Neck: Normal range of motion. Neck supple.  Cardiovascular: Normal rate, regular rhythm and normal heart sounds.   Pulmonary/Chest: Effort normal and breath sounds normal.  Abdominal: Soft. Bowel sounds are normal.  Musculoskeletal: Normal range of motion.  Neurological: She is alert and oriented to person, place, and time.  Skin: Skin is warm and dry.  Psychiatric: She has a normal mood and affect. Her behavior is normal.    ED Course  Procedures (including critical care time) Labs Review Labs Reviewed -  No data to display Imaging Review No results found.   EKG Interpretation None      MDM   Final diagnoses:  Headache    Normal vital signs. Patient feels better after IV fluids, IV Toradol, Reglan, Benadryl.    D/C meds Percocet and Phenergan 25 mg    Donnetta Hutching, MD 11/08/13 2209

## 2013-11-08 NOTE — ED Notes (Signed)
Pt presents w/ c/o HA x 1 week.  Reports pain 10/10 generalized in her head sharp in nature.  Denies sensitivity to light and sound.  Denies nausea.

## 2014-03-19 ENCOUNTER — Encounter (HOSPITAL_COMMUNITY): Payer: Self-pay | Admitting: Emergency Medicine

## 2014-03-19 ENCOUNTER — Emergency Department (INDEPENDENT_AMBULATORY_CARE_PROVIDER_SITE_OTHER)
Admission: EM | Admit: 2014-03-19 | Discharge: 2014-03-19 | Disposition: A | Discharge status: Home / Self Care | Payer: 59 | Source: Home / Self Care | Attending: Emergency Medicine | Admitting: Emergency Medicine

## 2014-03-19 DIAGNOSIS — J020 Streptococcal pharyngitis: Secondary | ICD-10-CM

## 2014-03-19 LAB — POCT RAPID STREP A: Streptococcus, Group A Screen (Direct): POSITIVE — AB

## 2014-03-19 MED ORDER — PREDNISOLONE 15 MG/5ML PO SYRP: ORAL_SOLUTION | ORAL | Status: DC

## 2014-03-19 MED ORDER — AMOXICILLIN 250 MG/5ML PO SUSR: 500.0000 mg | Freq: Three times a day (TID) | ORAL | Status: DC

## 2014-03-19 NOTE — ED Notes (Signed)
C/o sore throat x 3-4 days, concerned about poss strep, around a lot of small children who have had strep

## 2014-03-19 NOTE — Discharge Instructions (Signed)
Strep Throat Strep throat is an infection of the throat caused by a bacteria named Streptococcus pyogenes. Your caregiver may call the infection streptococcal "tonsillitis" or "pharyngitis" depending on whether there are signs of inflammation in the tonsils or back of the throat. Strep throat is most common in children aged 27-15 years during the cold months of the year, but it can occur in people of any age during any season. This infection is spread from person to person (contagious) through coughing, sneezing, or other close contact. SYMPTOMS   Fever or chills.  Painful, swollen, red tonsils or throat.  Pain or difficulty when swallowing.  White or yellow spots on the tonsils or throat.  Swollen, tender lymph nodes or "glands" of the neck or under the jaw.  Red rash all over the body (rare). DIAGNOSIS  Many different infections can cause the same symptoms. A test must be done to confirm the diagnosis so the right treatment can be given. A "rapid strep test" can help your caregiver make the diagnosis in a few minutes. If this test is not available, a light swab of the infected area can be used for a throat culture test. If a throat culture test is done, results are usually available in a day or two. TREATMENT  Strep throat is treated with antibiotic medicine. HOME CARE INSTRUCTIONS   Gargle with 1 tsp of salt in 1 cup of warm water, 3-4 times per day or as needed for comfort.  Family members who also have a sore throat or fever should be tested for strep throat and treated with antibiotics if they have the strep infection.  Make sure everyone in your household washes their hands well.  Do not share food, drinking cups, or personal items that could cause the infection to spread to others.  You may need to eat a soft food diet until your sore throat gets better.  Drink enough water and fluids to keep your urine clear or pale yellow. This will help prevent dehydration.  Get plenty of  rest.  Stay home from school, daycare, or work until you have been on antibiotics for 24 hours.  Only take over-the-counter or prescription medicines for pain, discomfort, or fever as directed by your caregiver.  If antibiotics are prescribed, take them as directed. Finish them even if you start to feel better. SEEK MEDICAL CARE IF:   The glands in your neck continue to enlarge.  You develop a rash, cough, or earache.  You cough up green, yellow-brown, or bloody sputum.  You have pain or discomfort not controlled by medicines.  Your problems seem to be getting worse rather than better. SEEK IMMEDIATE MEDICAL CARE IF:   You develop any new symptoms such as vomiting, severe headache, stiff or painful neck, chest pain, shortness of breath, or trouble swallowing.  You develop severe throat pain, drooling, or changes in your voice.  You develop swelling of the neck, or the skin on the neck becomes red and tender.  You have a fever.  You develop signs of dehydration, such as fatigue, dry mouth, and decreased urination.  You become increasingly sleepy, or you cannot wake up completely. Document Released: 08/25/2000 Document Revised: 08/14/2012 Document Reviewed: 10/27/2010 [employer] Patient Information 2015 [employer]. This information is not intended to replace advice given to you by your health care provider. Make sure you discuss any questions you have with your health care provider.  

## 2014-03-19 NOTE — ED Provider Notes (Signed)
  Chief Complaint   Chief Complaint  Patient presents with  . Sore Throat    History of Present Illness   Crystal [name] is a 28 year old female who's had a two-day history of sore throat, chills, headache, neck pain, and cough. She thinks she may have been exposed to strep.   Review of Systems   Other than as noted above, the patient denies any of the following symptoms. Systemic:  No fever, chills, sweats, myalgias, or headache. Eye:  No redness, pain or drainage. ENT:  No earache, nasal congestion, sneezing, rhinorrhea, sinus pressure, sinus pain, or post nasal drip. Lungs:  No cough, sputum production, wheezing, shortness of breath, or chest pain. GI:  No abdominal pain, nausea, vomiting, or diarrhea. Skin:  No rash.  PMFSH   Past medical history, family history, social history, meds, and allergies were reviewed.   Physical Exam     Vital signs:  BP 124/81  Pulse 106  Temp(Src) 100.2 F (37.9 C) (Oral)  Resp 18  SpO2 98% General:  Alert, in no distress. Phonation was normal, no drooling, and patient was able to handle secretions well.  Eye:  No conjunctival injection or drainage. Lids were normal. ENT:  TMs and canals were normal, without erythema or inflammation.  Nasal mucosa was clear and uncongested, without drainage.  Mucous membranes were moist.  Exam of pharynx reveals tonsils to be enlarged and red with spots of white exudate.  There were no oral ulcerations or lesions. There was no bulging of the tonsillar pillars, and the uvula was midline. Neck:  Supple, no adenopathy, tenderness or mass. Lungs:  No respiratory distress.  Lungs were clear to auscultation, without wheezes, rales or rhonchi.  Breath sounds were clear and equal bilaterally.  Heart:  Regular rhythm, without gallops, murmers or rubs. Skin:  Clear, warm, and dry, without rash or lesions.  Labs   Results for orders placed during the hospital encounter of 03/19/14  POCT RAPID STREP A ([facility]  ONLY)      Result Value Ref Range   Streptococcus, Group A Screen (Direct) POSITIVE (*) NEGATIVE   Assessment   The encounter diagnosis was Strep throat.  There is no evidence of a peritonsillar abscess, retropharyngeal abscess, or epiglottitis.    Plan     1.  Meds:  The following meds were prescribed:   Discharge Medication List as of 03/19/2014  1:58 PM    START taking these medications   Details  amoxicillin (AMOXIL) 250 MG/5ML suspension Take 10 mLs (500 mg total) by mouth 3 (three) times daily., Starting 03/19/2014, Until Discontinued, Normal    prednisoLONE (PRELONE) 15 MG/5ML syrup 10 mL BID, Normal        2.  Patient Education/Counseling:  The patient was given appropriate handouts, self care instructions, and instructed in symptomatic relief, including hot saline gargles, throat lozenges, infectious precautions, and need to trade out toothbrush.    3.  Follow up:  The patient was told to follow up here if no better in 3 to 4 days, or sooner if becoming worse in any way, and given some red flag symptoms such as difficulty swallowing or breathing which would prompt immediate return.      Reuben Likesavid C Tammi Boulier, MD 03/19/14 [phone]

## 2014-04-27 ENCOUNTER — Other Ambulatory Visit (HOSPITAL_COMMUNITY)
Admission: RE | Admit: 2014-04-27 | Discharge: 2014-04-27 | Disposition: A | Discharge status: Home / Self Care | Payer: 59 | Source: Ambulatory Visit | Attending: Family Medicine | Admitting: Family Medicine

## 2014-04-27 ENCOUNTER — Emergency Department (HOSPITAL_COMMUNITY)
Admission: EM | Admit: 2014-04-27 | Discharge: 2014-04-27 | Disposition: A | Discharge status: Home / Self Care | Payer: 59 | Source: Home / Self Care | Attending: Family Medicine | Admitting: Family Medicine

## 2014-04-27 ENCOUNTER — Encounter (HOSPITAL_COMMUNITY): Payer: Self-pay | Admitting: Emergency Medicine

## 2014-04-27 DIAGNOSIS — N760 Acute vaginitis: Secondary | ICD-10-CM | POA: Insufficient documentation

## 2014-04-27 DIAGNOSIS — Z113 Encounter for screening for infections with a predominantly sexual mode of transmission: Secondary | ICD-10-CM | POA: Diagnosis not present

## 2014-04-27 LAB — POCT URINALYSIS DIP (DEVICE)
Bilirubin Urine: NEGATIVE
Glucose, UA: NEGATIVE mg/dL
Hgb urine dipstick: NEGATIVE
Ketones, ur: NEGATIVE mg/dL
Nitrite: NEGATIVE
Protein, ur: NEGATIVE mg/dL
Specific Gravity, Urine: >=1.03 (ref 1.005–1.030)
Urobilinogen, UA: 1 mg/dL (ref 0.0–1.0)
pH: 6 (ref 5.0–8.0)

## 2014-04-27 LAB — POCT PREGNANCY, URINE: Preg Test, Ur: NEGATIVE

## 2014-04-27 MED ORDER — METRONIDAZOLE 500 MG PO TABS: 500.0000 mg | ORAL_TABLET | Freq: Two times a day (BID) | ORAL | Status: DC

## 2014-04-27 NOTE — ED Provider Notes (Signed)
Crystal [name] is a 28 y.o. female who presents to Urgent Care today for Vaginal discharge associated with mild pelvic and back pain. No significant pain with urination. Patient denies any fevers or chills nausea vomiting or diarrhea.   Past Medical History  Diagnosis Date  . Allergy   . PCOS (polycystic ovarian syndrome)     No radiological confirmation  . Irregular menses   . Hx of chlamydia infection   . H/O varicella   . History of PID   . Obese   . Status post primary low transverse cesarean section - 12/7 08/17/2012    Presumed macrosomia - FT progress with NRFT   . Postpartum care following cesarean delivery 08/19/2012   History  Substance Use Topics  . Smoking status: Former Smoker    Types: Cigarettes  . Smokeless tobacco: Never Used  . Alcohol Use: Yes   ROS as above Medications: No current facility-administered medications for this encounter.   Current Outpatient Prescriptions  Medication Sig Dispense Refill  . phentermine 37.5 MG capsule Take 37.5 mg by mouth every morning.      . medroxyPROGESTERone (PROVERA) 5 MG tablet Take 5 mg by mouth as directed. Take 1 tab daily for 7 days at the beginning of each month.      . metroNIDAZOLE (FLAGYL) 500 MG tablet Take 1 tablet (500 mg total) by mouth 2 (two) times daily.  14 tablet  0  . [DISCONTINUED] promethazine (PHENERGAN) 25 MG tablet Take 1 tablet (25 mg total) by mouth every 6 (six) hours as needed for nausea.  15 tablet  0    Exam:  BP 124/73  Pulse 72  Temp(Src) 98.5 F (36.9 C) (Oral)  Resp 16  LMP 04/19/2014  Breastfeeding? No Gen: Well NAD HEENT: EOMI,  MMM Lungs: Normal work of breathing. CTABL Heart: RRR no MRG Abd: NABS, Soft. Nondistended, Nontender Exts: Brisk capillary refill, warm and well perfused.  GYN: normal external genitalia. Vaginal canal with thin white discharge. Cervix is normal appearing and not.  Results for orders placed during the hospital encounter of 04/27/14 (from the past 24  hour(s))  POCT URINALYSIS DIP (DEVICE)     Status: Abnormal   Collection Time    04/27/14  4:36 PM      Result Value Ref Range   Glucose, UA NEGATIVE  NEGATIVE mg/dL   Bilirubin Urine NEGATIVE  NEGATIVE   Ketones, ur NEGATIVE  NEGATIVE mg/dL   Specific Gravity, Urine >=1.030  1.005 - 1.030   Hgb urine dipstick NEGATIVE  NEGATIVE   pH 6.0  5.0 - 8.0   Protein, ur NEGATIVE  NEGATIVE mg/dL   Urobilinogen, UA 1.0  0.0 - 1.0 mg/dL   Nitrite NEGATIVE  NEGATIVE   Leukocytes, UA TRACE (*) NEGATIVE  POCT PREGNANCY, URINE     Status: None   Collection Time    04/27/14  4:41 PM      Result Value Ref Range   Preg Test, Ur NEGATIVE  NEGATIVE   No results found.  Assessment and Plan: 28 y.o. female with vaginitis. Cytology pending. Treatment with Flagyl. Urine culture pending.  Discussed warning signs or symptoms. Please see discharge instructions. Patient expresses understanding.   This note was created using Conservation officer, historic buildingsDragon voice recognition software. Any transcription errors are unintended.    Rodolph BongEvan S Sharrod Achille, MD 04/27/14 [phone]

## 2014-04-27 NOTE — Discharge Instructions (Signed)
Thank you for coming in today. °If your belly pain worsens, or you have high fever, bad vomiting, blood in your stool or black tarry stool go to the Emergency Room.  ° ° °Bacterial Vaginosis °Bacterial vaginosis is a vaginal infection that occurs when the normal balance of bacteria in the vagina is disrupted. It results from an overgrowth of certain bacteria. This is the most common vaginal infection in women of childbearing age. Treatment is important to prevent complications, especially in pregnant women, as it can cause a premature delivery. °CAUSES  °Bacterial vaginosis is caused by an increase in harmful bacteria that are normally present in smaller amounts in the vagina. Several different kinds of bacteria can cause bacterial vaginosis. However, the reason that the condition develops is not fully understood. °RISK FACTORS °Certain activities or behaviors can put you at an increased risk of developing bacterial vaginosis, including: °· Having a new sex partner or multiple sex partners. °· Douching. °· Using an intrauterine device (IUD) for contraception. °Women do not get bacterial vaginosis from toilet seats, bedding, swimming pools, or contact with objects around them. °SIGNS AND SYMPTOMS  °Some women with bacterial vaginosis have no signs or symptoms. Common symptoms include: °· Grey vaginal discharge. °· A fishlike odor with discharge, especially after sexual intercourse. °· Itching or burning of the vagina and vulva. °· Burning or pain with urination. °DIAGNOSIS  °Your health care provider will take a medical history and examine the vagina for signs of bacterial vaginosis. A sample of vaginal fluid may be taken. Your health care provider will look at this sample under a microscope to check for bacteria and abnormal cells. A vaginal pH test may also be done.  °TREATMENT  °Bacterial vaginosis may be treated with antibiotic medicines. These may be given in the form of a pill or a vaginal cream. A second round  of antibiotics may be prescribed if the condition comes back after treatment.  °HOME CARE INSTRUCTIONS  °· Only take over-the-counter or prescription medicines as directed by your health care provider. °· If antibiotic medicine was prescribed, take it as directed. Make sure you finish it even if you start to feel better. °· Do not have sex until treatment is completed. °· Tell all sexual partners that you have a vaginal infection. They should see their health care provider and be treated if they have problems, such as a mild rash or itching. °· Practice safe sex by using condoms and only having one sex partner. °SEEK MEDICAL CARE IF:  °· Your symptoms are not improving after 3 days of treatment. °· You have increased discharge or pain. °· You have a fever. °MAKE SURE YOU:  °· Understand these instructions. °· Will watch your condition. °· Will get help right away if you are not doing well or get worse. °FOR MORE INFORMATION  °Centers for Disease Control and Prevention, Division of STD Prevention: www.cdc.gov/std °American Sexual Health Association (ASHA): www.ashastd.org  °Document Released: 08/28/2005 Document Revised: 06/18/2013 Document Reviewed: 04/09/2013 °ExitCare® Patient Information ©2015 [employer]. This information is not intended to replace advice given to you by your health care provider. Make sure you discuss any questions you have with your health care provider. ° °

## 2014-04-27 NOTE — ED Notes (Signed)
Call back number verified.  

## 2014-04-27 NOTE — ED Notes (Signed)
Pt c/o a clear vag d/c onset 1 week Sx also include: abd/back pain Denies urinary sx, f/v/n/d Alert; no signs of acute distress.

## 2014-04-29 LAB — URINE CULTURE
Colony Count: 70000
Special Requests: NORMAL

## 2014-07-13 ENCOUNTER — Encounter (HOSPITAL_COMMUNITY): Payer: Self-pay | Admitting: Emergency Medicine

## 2014-08-11 ENCOUNTER — Encounter: Payer: Self-pay | Admitting: Family Medicine

## 2014-08-11 ENCOUNTER — Ambulatory Visit (INDEPENDENT_AMBULATORY_CARE_PROVIDER_SITE_OTHER): Payer: 59 | Admitting: Family Medicine

## 2014-08-11 VITALS — BP 127/84 | HR 81 | Temp 98.1°F | Wt 287.0 lb

## 2014-08-11 DIAGNOSIS — S0082XA Blister (nonthermal) of other part of head, initial encounter: Secondary | ICD-10-CM

## 2014-08-11 DIAGNOSIS — R590 Localized enlarged lymph nodes: Secondary | ICD-10-CM

## 2014-08-11 DIAGNOSIS — R599 Enlarged lymph nodes, unspecified: Secondary | ICD-10-CM

## 2014-08-11 NOTE — Progress Notes (Signed)
Patient ID: Crystal [name], female   DOB: 01-03-1986, 28 y.o.   MRN: [id number]  HPI:  Pt presents for a same day appointment to discuss lump around R ear.  Pt states that 8 days ago she awoke with blisters on her R upper forehead. She noticed that day that she had a lump in front of her R ear and also behind it. The blisters did not hurt but eventually crusted and are now slightly itchy. Has had a mild headache. No problems with her eyes at all. No runny nose, cough, sore throat, fevers. Unsure if she had chicken pox as a child. Only history of blistering in the past was one small blister around her belly button about 2 months ago. No lesions in mouth or genitals, or elsewhere on her body at present.   ROS: See HPI  PMFSH: hx PCOS, allergic rhinitis  PHYSICAL EXAM: BP 127/84 mmHg  Pulse 81  Temp(Src) 98.1 F (36.7 C) (Oral)  Wt 287 lb (130.182 kg)  LMP 07/21/2014 Gen: NAD HEENT: NCAT, TM's clear bilat, oropharynx clear and moist without exudate, no anterior cervical LAD. + 1cm mobile lymph node present postauricular on the R. 0.5cm preauricular lymph node on L. PERRL. No conjunctival changes or abnormalities. Skin: appx four small crusted blisters present on R upper forehead in cluster formation, near the hairline. No other lesions on face. Neuro: grossly nonfocal speech normal.  ASSESSMENT/PLAN:  1. Blisters of face - concerning for possible herpes zoster. Suspect R ear lumps noticed by patient (and reason for presentation) are reactive lymph nodes. She has no signs of ophthalmologic involvement at this time. At this time will monitor. Discussed importance of returning if she has any spreading of rash, or any changes in her eyes at all. Precepted with Dr. Randolm IdolFletke who agrees with this plan.   FOLLOW UP: F/u as needed if symptoms worsen or do not improve.   GrenadaBrittany J. Pollie MeyerMcIntyre, MD [facility]

## 2014-08-11 NOTE — Patient Instructions (Signed)
Return immediately to be seen if you have any problems with your eyes (burning, itching, vision changes, spots, etc). Keep the blisters clean and covered, try not to touch them. Return if worsening.  Be well, Dr. Pollie MeyerMcIntyre

## 2014-09-16 ENCOUNTER — Encounter: Payer: Self-pay | Admitting: Family Medicine

## 2014-09-16 ENCOUNTER — Ambulatory Visit (INDEPENDENT_AMBULATORY_CARE_PROVIDER_SITE_OTHER): Payer: 59 | Admitting: Family Medicine

## 2014-09-16 VITALS — BP 117/82 | HR 65 | Temp 98.0°F | Ht 66.0 in | Wt 288.0 lb

## 2014-09-16 DIAGNOSIS — M5442 Lumbago with sciatica, left side: Secondary | ICD-10-CM

## 2014-09-16 DIAGNOSIS — M5441 Lumbago with sciatica, right side: Secondary | ICD-10-CM

## 2014-09-16 DIAGNOSIS — M545 Low back pain, unspecified: Secondary | ICD-10-CM | POA: Insufficient documentation

## 2014-09-16 DIAGNOSIS — N760 Acute vaginitis: Secondary | ICD-10-CM

## 2014-09-16 MED ORDER — FLUCONAZOLE 150 MG PO TABS: 150.0000 mg | ORAL_TABLET | Freq: Once | ORAL | Status: DC

## 2014-09-16 MED ORDER — CYCLOBENZAPRINE HCL 5 MG PO TABS: 5.0000 mg | ORAL_TABLET | Freq: Three times a day (TID) | ORAL | Status: DC | PRN

## 2014-09-16 NOTE — Assessment & Plan Note (Signed)
Back spasms severe since Friday, off and on since birth of son 2 years ago, lumbar and radiating down both legs with tingling, normal exam - back rehab exercises discussed and given - flexeril prn

## 2014-09-16 NOTE — Assessment & Plan Note (Addendum)
Discharge and itching for 4 days, feels like prior yeast infections - diflucan once, repeat in 3 days prn

## 2014-09-16 NOTE — Progress Notes (Signed)
   Subjective:    Patient ID: Crystal [name], female    DOB: 03-01-1986, 29 y.o.   MRN: [id number]  HPI BACK PAIN  Location: L4-L5 Quality: sharp, cramping Onset: Friday Worse with: sitting Better with: rest Radiation: down both legs Trauma: no, lifting heavy water jugs Best sitting/standing/leaning forward: standing  Red Flags Fecal/urinary incontinence: no  Numbness/Weakness: no  Fever/chills/sweats: no  Night pain: no  Unexplained weight loss: no  No relief with bedrest: no  h/o cancer/immunosuppression: no  IV drug use: no  PMH of osteoporosis or chronic steroid use: no     Review of Systems See HPI    Objective:   Physical Exam  Constitutional: She is oriented to person, place, and time. She appears well-developed and well-nourished. No distress.  HENT:  Head: Normocephalic and atraumatic.  Eyes: Conjunctivae are normal. Right eye exhibits no discharge. Left eye exhibits no discharge. No scleral icterus.  Cardiovascular: Normal rate.   Pulmonary/Chest: Effort normal. No respiratory distress.  Abdominal: She exhibits no distension.  Musculoskeletal: Normal range of motion. She exhibits no edema or tenderness.       Lumbar back: She exhibits pain and spasm. She exhibits normal range of motion, no tenderness, no bony tenderness, no swelling, no edema, no deformity, no laceration and normal pulse.  Neurological: She is alert and oriented to person, place, and time. She has normal strength and normal reflexes. No sensory deficit.  Skin: Skin is warm and dry. She is not diaphoretic.  Psychiatric: She has a normal mood and affect. Her behavior is normal.  Nursing note and vitals reviewed.         Assessment & Plan:

## 2014-09-16 NOTE — Patient Instructions (Signed)

## 2014-11-10 ENCOUNTER — Inpatient Hospital Stay (HOSPITAL_COMMUNITY)
Admission: AD | Admit: 2014-11-10 | Discharge: 2014-11-10 | Disposition: A | Discharge status: Home / Self Care | Payer: 59 | Source: Ambulatory Visit | Attending: Family Medicine | Admitting: Family Medicine

## 2014-11-10 ENCOUNTER — Inpatient Hospital Stay (HOSPITAL_COMMUNITY): Payer: 59

## 2014-11-10 ENCOUNTER — Encounter (HOSPITAL_COMMUNITY): Payer: Self-pay | Admitting: *Deleted

## 2014-11-10 DIAGNOSIS — R109 Unspecified abdominal pain: Secondary | ICD-10-CM | POA: Diagnosis present

## 2014-11-10 DIAGNOSIS — N898 Other specified noninflammatory disorders of vagina: Secondary | ICD-10-CM | POA: Insufficient documentation

## 2014-11-10 DIAGNOSIS — O209 Hemorrhage in early pregnancy, unspecified: Secondary | ICD-10-CM | POA: Insufficient documentation

## 2014-11-10 DIAGNOSIS — O4691 Antepartum hemorrhage, unspecified, first trimester: Secondary | ICD-10-CM

## 2014-11-10 DIAGNOSIS — Z3A11 11 weeks gestation of pregnancy: Secondary | ICD-10-CM | POA: Insufficient documentation

## 2014-11-10 DIAGNOSIS — Z87891 Personal history of nicotine dependence: Secondary | ICD-10-CM | POA: Insufficient documentation

## 2014-11-10 LAB — URINALYSIS, ROUTINE W REFLEX MICROSCOPIC
Bilirubin Urine: NEGATIVE
Glucose, UA: NEGATIVE mg/dL
Hgb urine dipstick: NEGATIVE
Ketones, ur: 15 mg/dL — AB
Leukocytes, UA: NEGATIVE
Nitrite: NEGATIVE
Protein, ur: NEGATIVE mg/dL
Specific Gravity, Urine: >1.03 — ABNORMAL HIGH (ref 1.005–1.030)
Urobilinogen, UA: 1 mg/dL (ref 0.0–1.0)
pH: 6 (ref 5.0–8.0)

## 2014-11-10 LAB — WET PREP, GENITAL
Trich, Wet Prep: NONE SEEN
Yeast Wet Prep HPF POC: NONE SEEN

## 2014-11-10 LAB — HCG, QUANTITATIVE, PREGNANCY: hCG, Beta Chain, Quant, S: 89406 m[IU]/mL — ABNORMAL HIGH (ref <5)

## 2014-11-10 LAB — CBC
HCT: 34.6 % — ABNORMAL LOW (ref 36.0–46.0)
Hemoglobin: 11.4 g/dL — ABNORMAL LOW (ref 12.0–15.0)
MCH: 26 pg (ref 26.0–34.0)
MCHC: 32.9 g/dL (ref 30.0–36.0)
MCV: 79 fL (ref 78.0–100.0)
Platelets: 249 10*3/uL (ref 150–400)
RBC: 4.38 MIL/uL (ref 3.87–5.11)
RDW: 15.5 % (ref 11.5–15.5)
WBC: 6.2 10*3/uL (ref 4.0–10.5)

## 2014-11-10 LAB — POCT PREGNANCY, URINE: Preg Test, Ur: POSITIVE — AB

## 2014-11-10 NOTE — Discharge Instructions (Signed)

## 2014-11-10 NOTE — MAU Note (Addendum)
Patient states she had IUP confirmed at [facility]. States she is no longer a patient there because she could not pay the co-pay. C/O burning pain in mid to upper abdomen, intermittent. Feels pain 3-4 times per day. States she has just gotten over a stomach virus. States she has brown vaginal discharge with clumps of yellow discharge. C/O HA's, nosebleeds.

## 2014-11-10 NOTE — MAU Provider Note (Signed)
History     CSN: [id number]  Arrival date and time: 11/10/14 4098   First Provider Initiated Contact with Patient 11/10/14 [phone]      No chief complaint on file.  HPI 29 y.o. G3P1011  presents to the MAU for abdominal pain. Patient states she had IUP confirmed at [facility]. States she is no longer a patient there because she could not pay the co-pay. C/O burning pain in mid to upper abdomen, intermittent. Feels pain 3-4 times per day. States she has just gotten over a stomach virus. States she has brown vaginal discharge with clumps of yellow discharge. C/O HA's, nosebleeds  Past Medical History  Diagnosis Date  . Allergy   . PCOS (polycystic ovarian syndrome)     No radiological confirmation  . Irregular menses   . Hx of chlamydia infection   . H/O varicella   . History of PID   . Obese   . Status post primary low transverse cesarean section - 12/7 08/17/2012    Presumed macrosomia - FT progress with NRFT   . Postpartum care following cesarean delivery 08/19/2012    Past Surgical History  Procedure Laterality Date  . Cesarean section  08/18/2012    Procedure: CESAREAN SECTION;  Surgeon: Tresa Endo A. Ernestina Penna, MD;  Location: [facility];  Service: Obstetrics;  Laterality: N/A;  Primary Cesarean Section     Family History  Problem Relation Age of Onset  . Heart disease Mother   . Hypertension Mother   . Diabetes Mother   . Asthma Mother   . Heart disease Father   . Hypertension Father   . Diabetes Father   . Asthma Father   . Diabetes Maternal Grandmother   . Hypertension Maternal Grandmother   . Cancer Maternal Grandmother     urethral, lung    History  Substance Use Topics  . Smoking status: Former Smoker    Types: Cigarettes  . Smokeless tobacco: Never Used  . Alcohol Use: Yes    Allergies: No Known Allergies  Prescriptions prior to admission  Medication Sig Dispense Refill Last Dose  . cyclobenzaprine (FLEXERIL) 5 MG tablet Take 1 tablet (5 mg total) by  mouth 3 (three) times daily as needed for muscle spasms. (Patient not taking: Reported on 11/10/2014) 90 tablet 1   . fluconazole (DIFLUCAN) 150 MG tablet Take 1 tablet (150 mg total) by mouth once. Repeat in 3 days if still having symptoms (Patient not taking: Reported on 11/10/2014) 2 tablet 0   . metroNIDAZOLE (FLAGYL) 500 MG tablet Take 1 tablet (500 mg total) by mouth 2 (two) times daily. (Patient not taking: Reported on 11/10/2014) 14 tablet 0     Review of Systems  Constitutional: Negative for fever and chills.  HENT: Positive for nosebleeds.   Eyes: Negative for blurred vision and double vision.  Respiratory: Negative for cough.   Cardiovascular: Negative for chest pain.  Gastrointestinal: Positive for abdominal pain.  Genitourinary: Positive for dysuria.  Neurological: Negative for headaches.   Physical Exam   Blood pressure 128/74, pulse 81, temperature 98.1 F (36.7 C), temperature source Oral, resp. rate 18, height  (1.676 m), weight 135.172 kg (298 lb), last menstrual period 07/21/2014.  Physical Exam  Nursing note and vitals reviewed. Constitutional: She is oriented to person, place, and time. She appears well-developed and well-nourished.  HENT:  Head: Normocephalic.  Neck: Normal range of motion.  Cardiovascular: Normal rate.   Respiratory: Effort normal. No respiratory distress.  GI: Soft. She exhibits  no distension and no mass. There is no tenderness. There is no rebound and no guarding.  Genitourinary: Uterus is enlarged. Cervix exhibits motion tenderness and discharge. Cervix exhibits no friability. Right adnexum displays no mass, no tenderness and no fullness. Left adnexum displays no mass, no tenderness and no fullness.  Musculoskeletal: Normal range of motion.  Neurological: She is alert and oriented to person, place, and time.  Skin: Skin is warm and dry.  Psychiatric: She has a normal mood and affect. Her behavior is normal. Judgment and thought content  normal.    MAU Course  Procedures   Results for orders placed or performed during the hospital encounter of 11/10/14 (from the past 24 hour(s))  Urinalysis, Routine w reflex microscopic     Status: Abnormal   Collection Time: 11/10/14  8:45 AM  Result Value Ref Range   Color, Urine YELLOW YELLOW   APPearance CLEAR CLEAR   Specific Gravity, Urine >1.030 (H) 1.005 - 1.030   pH 6.0 5.0 - 8.0   Glucose, UA NEGATIVE NEGATIVE mg/dL   Hgb urine dipstick NEGATIVE NEGATIVE   Bilirubin Urine NEGATIVE NEGATIVE   Ketones, ur 15 (A) NEGATIVE mg/dL   Protein, ur NEGATIVE NEGATIVE mg/dL   Urobilinogen, UA 1.0 0.0 - 1.0 mg/dL   Nitrite NEGATIVE NEGATIVE   Leukocytes, UA NEGATIVE NEGATIVE  Pregnancy, urine POC     Status: Abnormal   Collection Time: 11/10/14  9:15 AM  Result Value Ref Range   Preg Test, Ur POSITIVE (A) NEGATIVE  Wet prep, genital     Status: Abnormal   Collection Time: 11/10/14  9:47 AM  Result Value Ref Range   Yeast Wet Prep HPF POC NONE SEEN NONE SEEN   Trich, Wet Prep NONE SEEN NONE SEEN   Clue Cells Wet Prep HPF POC FEW (A) NONE SEEN   WBC, Wet Prep HPF POC FEW (A) NONE SEEN  CBC     Status: Abnormal   Collection Time: 11/10/14 10:20 AM  Result Value Ref Range   WBC 6.2 4.0 - 10.5 K/uL   RBC 4.38 3.87 - 5.11 MIL/uL   Hemoglobin 11.4 (L) 12.0 - 15.0 g/dL   HCT 16.134.6 (L) 09.636.0 - 04.546.0 %   MCV 79.0 78.0 - 100.0 fL   MCH 26.0 26.0 - 34.0 pg   MCHC 32.9 30.0 - 36.0 g/dL   RDW 40.915.5 81.111.5 - 91.415.5 %   Platelets 249 150 - 400 K/uL   [place] Ob Comp Less 14 Wks  11/10/2014   CLINICAL DATA:  Vaginal bleeding, lower abdominal pain for 3 days.  EXAM: OBSTETRIC <14 WK ULTRASOUND  TECHNIQUE: Transabdominal ultrasound was performed for evaluation of the gestation as well as the maternal uterus and adnexal regions.  COMPARISON:  None.  FINDINGS: Intrauterine gestational sac: Visualized/normal in shape.  Yolk sac:  Not visualize  Embryo:  Visualized  Cardiac Activity: Visualized  Heart Rate:  155 bpm  MSD:   mm    w     d  CRL:   44.1  mm   11 w 2 d                  US EDC: 05/30/2015  Maternal uterus/adnexae: Small subchorionic hemorrhage. Ovaries are symmetric in size and echotexture. No adnexal masses. No free fluid.  IMPRESSION: Eleven week 2 day intrauterine pregnancy. Fetal heart rate 155 beats per minute. Small subchorionic hemorrhage.   Electronically Signed   By: Charlett NoseKevin  Dover M.D.   On: 11/10/2014  10:54     Assessment and Plan  IUP @ [redacted]w[redacted]d Abdominal pain in pregnancy Vaginal bleeding in pregnancy  RTC as regularly scheduled and prn  Crystal [name] 11/10/2014, 10:59 AM

## 2014-11-11 LAB — GC/CHLAMYDIA PROBE AMP (~~LOC~~) NOT AT ARMC
Chlamydia: NEGATIVE
Neisseria Gonorrhea: NEGATIVE

## 2014-11-11 LAB — HIV ANTIBODY (ROUTINE TESTING W REFLEX): HIV Screen 4th Generation wRfx: NONREACTIVE

## 2014-11-15 ENCOUNTER — Encounter: Payer: Self-pay | Admitting: Family Medicine

## 2014-12-09 ENCOUNTER — Encounter: Payer: Self-pay | Admitting: Family Medicine

## 2014-12-09 ENCOUNTER — Ambulatory Visit (INDEPENDENT_AMBULATORY_CARE_PROVIDER_SITE_OTHER): Payer: 59 | Admitting: Family Medicine

## 2014-12-09 VITALS — BP 139/78 | HR 97 | Wt 298.0 lb

## 2014-12-09 DIAGNOSIS — Z3480 Encounter for supervision of other normal pregnancy, unspecified trimester: Secondary | ICD-10-CM | POA: Insufficient documentation

## 2014-12-09 DIAGNOSIS — Z3490 Encounter for supervision of normal pregnancy, unspecified, unspecified trimester: Secondary | ICD-10-CM | POA: Insufficient documentation

## 2014-12-09 DIAGNOSIS — Z3482 Encounter for supervision of other normal pregnancy, second trimester: Secondary | ICD-10-CM

## 2014-12-09 NOTE — Patient Instructions (Signed)
Second Trimester of Pregnancy The second trimester is from week 13 through week 28, months 4 through 6. The second trimester is often a time when you feel your best. Your body has also adjusted to being pregnant, and you begin to feel better physically. Usually, morning sickness has lessened or quit completely, you may have more energy, and you may have an increase in appetite. The second trimester is also a time when the fetus is growing rapidly. At the end of the sixth month, the fetus is about 9 inches long and weighs about 1 pounds. You will likely begin to feel the baby move (quickening) between 18 and 20 weeks of the pregnancy. BODY CHANGES Your body goes through many changes during pregnancy. The changes vary from woman to woman.   Your weight will continue to increase. You will notice your lower abdomen bulging out.  You may begin to get stretch marks on your hips, abdomen, and breasts.  You may develop headaches that can be relieved by medicines approved by your health care provider.  You may urinate more often because the fetus is pressing on your bladder.  You may develop or continue to have heartburn as a result of your pregnancy.  You may develop constipation because certain hormones are causing the muscles that push waste through your intestines to slow down.  You may develop hemorrhoids or swollen, bulging veins (varicose veins).  You may have back pain because of the weight gain and pregnancy hormones relaxing your joints between the bones in your pelvis and as a result of a shift in weight and the muscles that support your balance.  Your breasts will continue to grow and be tender.  Your gums may bleed and may be sensitive to brushing and flossing.  Dark spots or blotches (chloasma, mask of pregnancy) may develop on your face. This will likely fade after the baby is born.  A dark line from your belly button to the pubic area (linea nigra) may appear. This will likely fade  after the baby is born.  You may have changes in your hair. These can include thickening of your hair, rapid growth, and changes in texture. Some women also have hair loss during or after pregnancy, or hair that feels dry or thin. Your hair will most likely return to normal after your baby is born. WHAT TO EXPECT AT YOUR PRENATAL VISITS During a routine prenatal visit:  You will be weighed to make sure you and the fetus are growing normally.  Your blood pressure will be taken.  Your abdomen will be measured to track your baby's growth.  The fetal heartbeat will be listened to.  Any test results from the previous visit will be discussed. Your health care provider may ask you:  How you are feeling.  If you are feeling the baby move.  If you have had any abnormal symptoms, such as leaking fluid, bleeding, severe headaches, or abdominal cramping.  If you have any questions. Other tests that may be performed during your second trimester include:  Blood tests that check for:  Low iron levels (anemia).  Gestational diabetes (between 24 and 28 weeks).  Rh antibodies.  Urine tests to check for infections, diabetes, or protein in the urine.  An ultrasound to confirm the proper growth and development of the baby.  An amniocentesis to check for possible genetic problems.  Fetal screens for spina bifida and Down syndrome. HOME CARE INSTRUCTIONS   Avoid all smoking, herbs, alcohol, and unprescribed   drugs. These chemicals affect the formation and growth of the baby.  Follow your health care provider's instructions regarding medicine use. There are medicines that are either safe or unsafe to take during pregnancy.  Exercise only as directed by your health care provider. Experiencing uterine cramps is a good sign to stop exercising.  Continue to eat regular, healthy meals.  Wear a good support bra for breast tenderness.  Do not use hot tubs, steam rooms, or saunas.  Wear your  seat belt at all times when driving.  Avoid raw meat, uncooked cheese, cat litter boxes, and soil used by cats. These carry germs that can cause birth defects in the baby.  Take your prenatal vitamins.  Try taking a stool softener (if your health care provider approves) if you develop constipation. Eat more high-fiber foods, such as fresh vegetables or fruit and whole grains. Drink plenty of fluids to keep your urine clear or pale yellow.  Take warm sitz baths to soothe any pain or discomfort caused by hemorrhoids. Use hemorrhoid cream if your health care provider approves.  If you develop varicose veins, wear support hose. Elevate your feet for 15 minutes, 3-4 times a day. Limit salt in your diet.  Avoid heavy lifting, wear low heel shoes, and practice good posture.  Rest with your legs elevated if you have leg cramps or low back pain.  Visit your dentist if you have not gone yet during your pregnancy. Use a soft toothbrush to brush your teeth and be gentle when you floss.  A sexual relationship may be continued unless your health care provider directs you otherwise.  Continue to go to all your prenatal visits as directed by your health care provider. SEEK MEDICAL CARE IF:   You have dizziness.  You have mild pelvic cramps, pelvic pressure, or nagging pain in the abdominal area.  You have persistent nausea, vomiting, or diarrhea.  You have a bad smelling vaginal discharge.  You have pain with urination. SEEK IMMEDIATE MEDICAL CARE IF:   You have a fever.  You are leaking fluid from your vagina.  You have spotting or bleeding from your vagina.  You have severe abdominal cramping or pain.  You have rapid weight gain or loss.  You have shortness of breath with chest pain.  You notice sudden or extreme swelling of your face, hands, ankles, feet, or legs.  You have not felt your baby move in over an hour.  You have severe headaches that do not go away with  medicine.  You have vision changes. Document Released: 08/22/2001 Document Revised: 09/02/2013 Document Reviewed: 10/29/2012 ExitCare Patient Information 2015 [employer]. This information is not intended to replace advice given to you by your health care provider. Make sure you discuss any questions you have with your health care provider.  

## 2014-12-09 NOTE — Progress Notes (Signed)
Timmie FoersterFigen T Nova is a 29 y.o. G3P1011 at 7419w5d for transfer of OB care.  She reports fatigue, no bleeding, no contractions and no leaking See flow sheet for details.  A/P: Pregnancy at 6019w5d.  Doing well.   Pregnancy issues include needs to transfer care from private ob who does not accept medicaid. Reports they did dating ultrasound but not new OB labs. Records requested and not yet received.  Anatomy ultrasound ordered and scheduled at 18 weeks on 4/19 Bleeding and pain precautions reviewed. Follow up 4 weeks for new ob visit with pelvic.

## 2014-12-09 NOTE — Progress Notes (Signed)
Wants to VBAC, will need consent with OB faculty later in pregnancy.

## 2014-12-10 LAB — HIV ANTIBODY (ROUTINE TESTING W REFLEX): HIV 1&2 Ab, 4th Generation: NONREACTIVE

## 2014-12-10 LAB — OBSTETRIC PANEL
Antibody Screen: NEGATIVE
Basophils Absolute: 0 10*3/uL (ref 0.0–0.1)
Basophils Relative: 0 % (ref 0–1)
Eosinophils Absolute: 0.2 10*3/uL (ref 0.0–0.7)
Eosinophils Relative: 2 % (ref 0–5)
HCT: 36.2 % (ref 36.0–46.0)
Hemoglobin: 11.9 g/dL — ABNORMAL LOW (ref 12.0–15.0)
Hepatitis B Surface Ag: NEGATIVE
Lymphocytes Relative: 21 % (ref 12–46)
Lymphs Abs: 2 10*3/uL (ref 0.7–4.0)
MCH: 25.8 pg — ABNORMAL LOW (ref 26.0–34.0)
MCHC: 32.9 g/dL (ref 30.0–36.0)
MCV: 78.4 fL (ref 78.0–100.0)
MPV: 9.3 fL (ref 8.6–12.4)
Monocytes Absolute: 0.5 10*3/uL (ref 0.1–1.0)
Monocytes Relative: 5 % (ref 3–12)
Neutro Abs: 6.9 10*3/uL (ref 1.7–7.7)
Neutrophils Relative %: 72 % (ref 43–77)
Platelets: 311 10*3/uL (ref 150–400)
RBC: 4.62 MIL/uL (ref 3.87–5.11)
RDW: 15.4 % (ref 11.5–15.5)
Rh Type: POSITIVE
Rubella: 3.23 Index — ABNORMAL HIGH (ref <0.90)
WBC: 9.6 10*3/uL (ref 4.0–10.5)

## 2014-12-10 LAB — SICKLE CELL SCREEN: Sickle Cell Screen: NEGATIVE

## 2014-12-11 LAB — CULTURE, OB URINE
Colony Count: NO GROWTH
Organism ID, Bacteria: NO GROWTH

## 2014-12-15 ENCOUNTER — Encounter: Payer: Self-pay | Admitting: *Deleted

## 2014-12-16 NOTE — Addendum Note (Signed)
Addended by: Abram SanderADAMO, Brigitte Soderberg M on: 12/16/2014 01:16 AM   Modules accepted: Level of Service

## 2014-12-29 ENCOUNTER — Other Ambulatory Visit: Payer: Self-pay | Admitting: Family Medicine

## 2014-12-29 ENCOUNTER — Ambulatory Visit (HOSPITAL_COMMUNITY)
Admission: RE | Admit: 2014-12-29 | Discharge: 2014-12-29 | Disposition: A | Discharge status: Home / Self Care | Payer: 59 | Source: Ambulatory Visit | Attending: Family Medicine | Admitting: Family Medicine

## 2014-12-29 DIAGNOSIS — O99210 Obesity complicating pregnancy, unspecified trimester: Secondary | ICD-10-CM

## 2014-12-29 DIAGNOSIS — Z3482 Encounter for supervision of other normal pregnancy, second trimester: Secondary | ICD-10-CM

## 2014-12-29 DIAGNOSIS — Z3A18 18 weeks gestation of pregnancy: Secondary | ICD-10-CM | POA: Diagnosis not present

## 2014-12-29 DIAGNOSIS — Z36 Encounter for antenatal screening of mother: Secondary | ICD-10-CM | POA: Diagnosis present

## 2014-12-29 DIAGNOSIS — Z3689 Encounter for other specified antenatal screening: Secondary | ICD-10-CM | POA: Insufficient documentation

## 2014-12-29 DIAGNOSIS — O99212 Obesity complicating pregnancy, second trimester: Secondary | ICD-10-CM | POA: Diagnosis not present

## 2014-12-31 ENCOUNTER — Telehealth: Payer: Self-pay | Admitting: *Deleted

## 2014-12-31 NOTE — Telephone Encounter (Signed)
Spoke with patient regarding her need for a repeat ultrasound and she voiced understanding.  Appt for 01/05/2015 with Dr. Richarda BladeAdamo was confirmed. Khalif Stender,CMA

## 2014-12-31 NOTE — Telephone Encounter (Signed)
-----   Message from Abram SanderElena M Adamo, MD sent at 12/30/2014  4:43 PM EDT ----- Please let patient know that her ultrasound was normal but there were a few things they could not see well enough so she will need another ultrasound at the beginning of June. We can discuss this at her visit next week.

## 2015-01-05 ENCOUNTER — Encounter: Payer: Self-pay | Admitting: Family Medicine

## 2015-01-05 ENCOUNTER — Other Ambulatory Visit (HOSPITAL_COMMUNITY)
Admission: RE | Admit: 2015-01-05 | Discharge: 2015-01-05 | Disposition: A | Discharge status: Home / Self Care | Payer: 59 | Source: Ambulatory Visit | Attending: Family Medicine | Admitting: Family Medicine

## 2015-01-05 ENCOUNTER — Ambulatory Visit (INDEPENDENT_AMBULATORY_CARE_PROVIDER_SITE_OTHER): Payer: 59 | Admitting: Family Medicine

## 2015-01-05 VITALS — BP 131/73 | HR 87 | Temp 98.0°F | Wt 298.9 lb

## 2015-01-05 DIAGNOSIS — Z01419 Encounter for gynecological examination (general) (routine) without abnormal findings: Secondary | ICD-10-CM | POA: Insufficient documentation

## 2015-01-05 DIAGNOSIS — Z3482 Encounter for supervision of other normal pregnancy, second trimester: Secondary | ICD-10-CM

## 2015-01-05 DIAGNOSIS — Z113 Encounter for screening for infections with a predominantly sexual mode of transmission: Secondary | ICD-10-CM | POA: Diagnosis not present

## 2015-01-05 LAB — POCT WET PREP (WET MOUNT): Clue Cells Wet Prep Whiff POC: NEGATIVE

## 2015-01-05 LAB — GLUCOSE, CAPILLARY
Comment 1: 1
Glucose-Capillary: 125 mg/dL — ABNORMAL HIGH (ref 70–99)

## 2015-01-05 NOTE — Progress Notes (Signed)
Timmie FoersterFigen T Andre is a 29 y.o. yo G3P1011 at 2818w4d who presents for her initial prenatal visit. This is actually her second visit but not everything was done last time due to a scheduling error Pregnancy  is not planned She reports fatigue. She  is taking PNV. See flow sheet for details.  PMH, POBH, FH, meds, allergies and Social Hx reviewed.  Prenatal exam: Gen: Well nourished, well developed.  No distress.  Vitals noted. HEENT: Normocephalic, atraumatic.  Neck supple without cervical lymphadenopathy, thyromegaly or thyroid nodules.  fair dentition. CV: RRR no murmur, gallops or rubs Lungs: CTA B.  Normal respiratory effort without wheezes or rales. Abd: soft, NTND. +BS.  Uterus not appreciated above pelvis. GU: Normal external female genitalia without lesions.  Nl vaginal, well rugated without lesions. No vaginal discharge.  Bimanual exam: No adnexal mass or TTP. No CMT.  Uterus size 20weeks Ext: No clubbing, cyanosis or edema. Psych: Normal grooming and dress.  Not depressed or anxious appearing.  Normal thought content and process without flight of ideas or looseness of associations    Assessment/plan: 1) Pregnancy 2818w4d doing well.  Current pregnancy issues include: excessive weight gain. Discussed dietary recommendations and walking at least three times a week. Dating is reliable Prenatal labs reviewed, notable for nothing. Bleeding and pain precautions reviewed. Importance of prenatal vitamins reviewed.  Genetic screening offered and quad drawn today.  Early glucola is indicated and done today. Anatomy scan done with incomplete views so repeat in early June scheduled today.   Follow up 4 weeks.

## 2015-01-05 NOTE — Patient Instructions (Signed)
So far everything with your pregnancy is going well.  Our goal should be for you to gain no more than 5-10 more pounds in pregnancy. To do this I want you to focus on eating lean proteins like chicken and lots of vegetables and only having sweets as an occasional treat. Also, if you can find time to walk at least a few times a week, that will also help you prepare for an easier labor.

## 2015-01-06 LAB — CYTOLOGY - PAP

## 2015-01-06 LAB — CERVICOVAGINAL ANCILLARY ONLY
Chlamydia: NEGATIVE
Neisseria Gonorrhea: NEGATIVE

## 2015-01-13 ENCOUNTER — Telehealth: Payer: Self-pay | Admitting: *Deleted

## 2015-01-13 NOTE — Telephone Encounter (Signed)
-----   Message from Abram SanderElena M Adamo, MD sent at 01/13/2015  8:32 AM EDT ----- Please let Crystal [name] know that her labs were all normal. She does not have gestational diabetes. Her pap smear is normal and she will not need another for 3 years. Her quad screened was normal which means that her baby is very unlikely to have a genetic disorder like down syndrome.

## 2015-01-13 NOTE — Telephone Encounter (Signed)
Pt informed and scheduled to see dr Richarda Bladeadamo at the end of may. Crystal [name], CMA

## 2015-01-28 ENCOUNTER — Ambulatory Visit (INDEPENDENT_AMBULATORY_CARE_PROVIDER_SITE_OTHER): Payer: 59 | Admitting: Family Medicine

## 2015-01-28 ENCOUNTER — Encounter: Payer: Self-pay | Admitting: Family Medicine

## 2015-01-28 VITALS — BP 137/68 | HR 82 | Temp 98.3°F | Wt 301.0 lb

## 2015-01-28 DIAGNOSIS — O3660X0 Maternal care for excessive fetal growth, unspecified trimester, not applicable or unspecified: Secondary | ICD-10-CM

## 2015-01-28 DIAGNOSIS — O26849 Uterine size-date discrepancy, unspecified trimester: Secondary | ICD-10-CM

## 2015-01-28 DIAGNOSIS — Z3482 Encounter for supervision of other normal pregnancy, second trimester: Secondary | ICD-10-CM

## 2015-01-28 NOTE — Patient Instructions (Signed)
Fetal Movement Counts Performing a fetal movement count is highly recommended in high-risk pregnancies, but it is good for every pregnant woman to do. Your health care provider may ask you to start counting fetal movements at 28 weeks of the pregnancy. Fetal movements often increase:  After eating a full meal.  After physical activity.  After eating or drinking something sweet or cold.  At rest. Pay attention to when you feel the baby is most active. This will help you notice a pattern of your baby's sleep and wake cycles and what factors contribute to an increase in fetal movement. It is important to perform a fetal movement count at the same time each day when your baby is normally most active.  HOW TO COUNT FETAL MOVEMENTS 1. Find a quiet and comfortable area to sit or lie down on your left side. Lying on your left side provides the best blood and oxygen circulation to your baby. 2. Write down the day and time on a sheet of paper or in a journal. 3. Start counting kicks, flutters, swishes, rolls, or jabs in a 2-hour period. You should feel at least 10 movements within 2 hours. 4. If you do not feel 10 movements in 2 hours, wait 2-3 hours and count again. Look for a change in the pattern or not enough counts in 2 hours. SEEK MEDICAL CARE IF:  You feel less than 10 counts in 2 hours, tried twice.  There is no movement in over an hour.  The pattern is changing or taking longer each day to reach 10 counts in 2 hours.  You feel the baby is not moving as he or she usually does. Document Released: 09/27/2006 Document Revised: 01/12/2014 Document Reviewed: 06/24/2012 Exit[employer] Patient Information 2015 [employer]. This information is not intended to replace advice given to you by your health care provider. Make sure you discuss any questions you have with your health care provider.  

## 2015-01-28 NOTE — Progress Notes (Signed)
Timmie FoersterFigen T Vallery is a 29 y.o. G3P1011 at 5142w6d for routine follow up.  She reports abdominal pulling and cramping with activity. Good FM, no bleeding, LOF or ctx  See flow sheet for details.  A/P: Pregnancy at 8042w6d.  Doing well.   Pregnancy issues include excessive weight gain and size>dates Anatomy scan reviewed, problems are not noted. Repeat ultrasound scheduled 6/8, will await growth assessment on this scan. Preterm labor precautions reviewed. Follow up 4 weeks.

## 2015-01-28 NOTE — Assessment & Plan Note (Signed)
Measuring 26cm and maternal weight gain of 14lbs at 22 weeks - f/u ultrasound scheduled, will await growth assessment - counseled to walk more and cut out sweet drinks, junk food, etc

## 2015-02-17 ENCOUNTER — Ambulatory Visit (HOSPITAL_COMMUNITY)
Admission: RE | Admit: 2015-02-17 | Discharge: 2015-02-17 | Disposition: A | Discharge status: Home / Self Care | Payer: 59 | Source: Ambulatory Visit | Attending: Family Medicine | Admitting: Family Medicine

## 2015-02-17 DIAGNOSIS — Z36 Encounter for antenatal screening of mother: Secondary | ICD-10-CM | POA: Diagnosis not present

## 2015-02-17 DIAGNOSIS — O3421 Maternal care for scar from previous cesarean delivery: Secondary | ICD-10-CM | POA: Insufficient documentation

## 2015-02-17 DIAGNOSIS — Z3A25 25 weeks gestation of pregnancy: Secondary | ICD-10-CM | POA: Diagnosis not present

## 2015-02-17 DIAGNOSIS — Z3482 Encounter for supervision of other normal pregnancy, second trimester: Secondary | ICD-10-CM

## 2015-02-18 DIAGNOSIS — Z0489 Encounter for examination and observation for other specified reasons: Secondary | ICD-10-CM | POA: Insufficient documentation

## 2015-02-18 DIAGNOSIS — O99210 Obesity complicating pregnancy, unspecified trimester: Secondary | ICD-10-CM | POA: Insufficient documentation

## 2015-02-18 DIAGNOSIS — Z3A25 25 weeks gestation of pregnancy: Secondary | ICD-10-CM | POA: Insufficient documentation

## 2015-02-18 DIAGNOSIS — IMO0002 Reserved for concepts with insufficient information to code with codable children: Secondary | ICD-10-CM | POA: Insufficient documentation

## 2015-03-03 ENCOUNTER — Ambulatory Visit (INDEPENDENT_AMBULATORY_CARE_PROVIDER_SITE_OTHER): Payer: 59 | Admitting: Family Medicine

## 2015-03-03 VITALS — BP 134/70 | HR 97 | Temp 98.3°F | Wt 303.5 lb

## 2015-03-03 DIAGNOSIS — O3421 Maternal care for scar from previous cesarean delivery: Secondary | ICD-10-CM | POA: Diagnosis not present

## 2015-03-03 DIAGNOSIS — O34219 Maternal care for unspecified type scar from previous cesarean delivery: Secondary | ICD-10-CM | POA: Insufficient documentation

## 2015-03-03 DIAGNOSIS — Z3483 Encounter for supervision of other normal pregnancy, third trimester: Secondary | ICD-10-CM

## 2015-03-03 DIAGNOSIS — Z23 Encounter for immunization: Secondary | ICD-10-CM

## 2015-03-03 LAB — CBC
HCT: 31.2 % — ABNORMAL LOW (ref 36.0–46.0)
Hemoglobin: 9.9 g/dL — ABNORMAL LOW (ref 12.0–15.0)
MCH: 24.8 pg — ABNORMAL LOW (ref 26.0–34.0)
MCHC: 31.7 g/dL (ref 30.0–36.0)
MCV: 78 fL (ref 78.0–100.0)
MPV: 9.6 fL (ref 8.6–12.4)
Platelets: 265 10*3/uL (ref 150–400)
RBC: 4 MIL/uL (ref 3.87–5.11)
RDW: 14.7 % (ref 11.5–15.5)
WBC: 9.3 10*3/uL (ref 4.0–10.5)

## 2015-03-03 LAB — GLUCOSE, CAPILLARY
Comment 1: 1
Glucose-Capillary: 117 mg/dL — ABNORMAL HIGH (ref 65–99)

## 2015-03-03 NOTE — Assessment & Plan Note (Signed)
Patient desires vbac, had LTCS 3 years ago for macrosomia and fetal distress, so far this baby is 61st percentile - refer to obgyn for vbac consent, not for transfer of care

## 2015-03-03 NOTE — Progress Notes (Signed)
Crystal [name] is a 29 y.o. G3P1011 at [redacted]w[redacted]d for routine follow up.  She reports right sided pelvic burning/pulling pain, lasted 1 hour then resolved. Norm FM, no bleeding or LOF, occasional mild contractions, nothing regular. See flow sheet for details.  A/P: Pregnancy at [redacted]w[redacted]d.  Doing well.   Pregnancy issues include weight gain above goal and borderline BPs  Infant feeding choice breast Contraception choice undecided Infant circumcision desired not applicable  Tdapwas given today. 1 hour glucola, CBC, RPR, and HIV were done today.   RH status was reviewed and pt does not need Rhogam.  Rhogam was not given today.   Childbirth and education classes were offered. Preterm labor precautions reviewed. Kick counts reviewed. Follow up 2 weeks.

## 2015-03-03 NOTE — Patient Instructions (Signed)
Braxton Hicks Contractions °Contractions of the uterus can occur throughout pregnancy. Contractions are not always a sign that you are in labor.  °WHAT ARE BRAXTON HICKS CONTRACTIONS?  °Contractions that occur before labor are called Braxton Hicks contractions, or false labor. Toward the end of pregnancy (32-34 weeks), these contractions can develop more often and may become more forceful. This is not true labor because these contractions do not result in opening (dilatation) and thinning of the cervix. They are sometimes difficult to tell apart from true labor because these contractions can be forceful and people have different pain tolerances. You should not feel embarrassed if you go to the hospital with false labor. Sometimes, the only way to tell if you are in true labor is for your health care provider to look for changes in the cervix. °If there are no prenatal problems or other health problems associated with the pregnancy, it is completely safe to be sent home with false labor and await the onset of true labor. °HOW CAN YOU TELL THE DIFFERENCE BETWEEN TRUE AND FALSE LABOR? °False Labor °· The contractions of false labor are usually shorter and not as hard as those of true labor.   °· The contractions are usually irregular.   °· The contractions are often felt in the front of the lower abdomen and in the groin.   °· The contractions may go away when you walk around or change positions while lying down.   °· The contractions get weaker and are shorter lasting as time goes on.   °· The contractions do not usually become progressively stronger, regular, and closer together as with true labor.   °True Labor °· Contractions in true labor last 30-70 seconds, become very regular, usually become more intense, and increase in frequency.   °· The contractions do not go away with walking.   °· The discomfort is usually felt in the top of the uterus and spreads to the lower abdomen and low back.   °· True labor can be  determined by your health care provider with an exam. This will show that the cervix is dilating and getting thinner.   °WHAT TO REMEMBER °· Keep up with your usual exercises and follow other instructions given by your health care provider.   °· Take medicines as directed by your health care provider.   °· Keep your regular prenatal appointments.   °· Eat and drink lightly if you think you are going into labor.   °· If Braxton Hicks contractions are making you uncomfortable:   °¨ Change your position from lying down or resting to walking, or from walking to resting.   °¨ Sit and rest in a tub of warm water.   °¨ Drink 2-3 glasses of water. Dehydration may cause these contractions.   °¨ Do slow and deep breathing several times an hour.   °WHEN SHOULD I SEEK IMMEDIATE MEDICAL CARE? °Seek immediate medical care if: °· Your contractions become stronger, more regular, and closer together.   °· You have fluid leaking or gushing from your vagina.   °· You have a fever.   °· You pass blood-tinged mucus.   °· You have vaginal bleeding.   °· You have continuous abdominal pain.   °· You have low back pain that you never had before.   °· You feel your baby's head pushing down and causing pelvic pressure.   °· Your baby is not moving as much as it used to.   °Document Released: 08/28/2005 Document Revised: 09/02/2013 Document Reviewed: 06/09/2013 °[employer]® Patient Information ©2015 [employer]. This information is not intended to replace advice given to you by your health care   provider. Make sure you discuss any questions you have with your health care provider. ° °

## 2015-03-04 LAB — RPR

## 2015-03-04 LAB — HIV ANTIBODY (ROUTINE TESTING W REFLEX): HIV 1&2 Ab, 4th Generation: NONREACTIVE

## 2015-03-05 ENCOUNTER — Encounter: Payer: Self-pay | Admitting: *Deleted

## 2015-03-09 ENCOUNTER — Telehealth: Payer: Self-pay | Admitting: *Deleted

## 2015-03-09 NOTE — Telephone Encounter (Signed)
If she can make 1:30 on the 6th that should work, otherwise she should see another red team doc.

## 2015-03-09 NOTE — Telephone Encounter (Signed)
Patient scheduled.

## 2015-03-09 NOTE — Telephone Encounter (Signed)
Spoke with patient and informed her of message from PCP, patient expressed understanding. Patient then asked to scheduled follow up appointment with PCP (was told to return around 7/6). PCP completely booked until 7/26, offered to schedule with another provider but patient only wants to see PCP. Will forward to PCP to see if ok to doublebook.

## 2015-03-09 NOTE — Telephone Encounter (Signed)
-----   Message from Abram SanderElena M Adamo, MD sent at 03/05/2015  8:58 AM EDT ----- Please let patient know that her lab results are normal. She does not have gestational diabetes and her HIV and siphilis tests are also negative. Thanks!

## 2015-03-17 ENCOUNTER — Ambulatory Visit (INDEPENDENT_AMBULATORY_CARE_PROVIDER_SITE_OTHER): Payer: 59 | Admitting: Family Medicine

## 2015-03-17 VITALS — BP 130/60 | HR 78 | Temp 98.3°F | Wt 303.7 lb

## 2015-03-17 DIAGNOSIS — Z3483 Encounter for supervision of other normal pregnancy, third trimester: Secondary | ICD-10-CM

## 2015-03-17 NOTE — Progress Notes (Signed)
Crystal [name] is a 29 y.o. G3P1011 at 7569w5d for routine follow up.  She reports round ligament/side pain and low back pain.  See flow sheet for details.  A/P: Pregnancy at 7069w5d.  Doing well.   Pregnancy issues include none  Infant feeding choice breast Contraception choice undecided Infant circumcision desired not applicable  Tdapwas not given today. 1 hour glucola, CBC, RPR, and HIV were not done today.   Pregnancy medical home forms were not done today and reviewed.   RH status was reviewed and pt does not need Rhogam.  Rhogam was not given today.   Preterm labor precautions reviewed. Kick counts reviewed. Follow up with Dr. Shawnie PonsPratt on 7/15 for vbac consent/delivery discussion.

## 2015-03-26 ENCOUNTER — Ambulatory Visit (INDEPENDENT_AMBULATORY_CARE_PROVIDER_SITE_OTHER): Payer: 59 | Admitting: Family Medicine

## 2015-03-26 ENCOUNTER — Encounter: Payer: Self-pay | Admitting: Family Medicine

## 2015-03-26 VITALS — BP 129/74 | HR 92 | Temp 98.4°F | Wt 306.0 lb

## 2015-03-26 DIAGNOSIS — Z3483 Encounter for supervision of other normal pregnancy, third trimester: Secondary | ICD-10-CM

## 2015-03-26 DIAGNOSIS — O3421 Maternal care for scar from previous cesarean delivery: Secondary | ICD-10-CM

## 2015-03-26 DIAGNOSIS — O34219 Maternal care for unspecified type scar from previous cesarean delivery: Secondary | ICD-10-CM

## 2015-03-26 DIAGNOSIS — O99210 Obesity complicating pregnancy, unspecified trimester: Secondary | ICD-10-CM | POA: Insufficient documentation

## 2015-03-26 NOTE — Patient Instructions (Signed)
Breastfeeding Deciding to breastfeed is one of the best choices you can make for you and your baby. A change in hormones during pregnancy causes your breast tissue to grow and increases the number and size of your milk ducts. These hormones also allow proteins, sugars, and fats from your blood supply to make breast milk in your milk-producing glands. Hormones prevent breast milk from being released before your baby is born as well as prompt milk flow after birth. Once breastfeeding has begun, thoughts of your baby, as well as his or her sucking or crying, can stimulate the release of milk from your milk-producing glands.  BENEFITS OF BREASTFEEDING For Your Baby  Your first milk (colostrum) helps your baby's digestive system function better.   There are antibodies in your milk that help your baby fight off infections.   Your baby has a lower incidence of asthma, allergies, and sudden infant death syndrome.   The nutrients in breast milk are better for your baby than infant formulas and are designed uniquely for your baby's needs.   Breast milk improves your baby's brain development.   Your baby is less likely to develop other conditions, such as childhood obesity, asthma, or type 2 diabetes mellitus.  For You   Breastfeeding helps to create a very special bond between you and your baby.   Breastfeeding is convenient. Breast milk is always available at the correct temperature and costs nothing.   Breastfeeding helps to burn calories and helps you lose the weight gained during pregnancy.   Breastfeeding makes your uterus contract to its prepregnancy size faster and slows bleeding (lochia) after you give birth.   Breastfeeding helps to lower your risk of developing type 2 diabetes mellitus, osteoporosis, and breast or ovarian cancer later in life. SIGNS THAT YOUR BABY IS HUNGRY Early Signs of Hunger  Increased alertness or activity.  Stretching.  Movement of the head from  side to side.  Movement of the head and opening of the mouth when the corner of the mouth or cheek is stroked (rooting).  Increased sucking sounds, smacking lips, cooing, sighing, or squeaking.  Hand-to-mouth movements.  Increased sucking of fingers or hands. Late Signs of Hunger  Fussing.  Intermittent crying. Extreme Signs of Hunger Signs of extreme hunger will require calming and consoling before your baby will be able to breastfeed successfully. Do not wait for the following signs of extreme hunger to occur before you initiate breastfeeding:   Restlessness.  A loud, strong cry.   Screaming. BREASTFEEDING BASICS Breastfeeding Initiation  Find a comfortable place to sit or lie down, with your neck and back well supported.  Place a pillow or rolled up blanket under your baby to bring him or her to the level of your breast (if you are seated). Nursing pillows are specially designed to help support your arms and your baby while you breastfeed.  Make sure that your baby's abdomen is facing your abdomen.   Gently massage your breast. With your fingertips, massage from your chest wall toward your nipple in a circular motion. This encourages milk flow. You may need to continue this action during the feeding if your milk flows slowly.  Support your breast with 4 fingers underneath and your thumb above your nipple. Make sure your fingers are well away from your nipple and your baby's mouth.   Stroke your baby's lips gently with your finger or nipple.   When your baby's mouth is open wide enough, quickly bring your baby to your   breast, placing your entire nipple and as much of the colored area around your nipple (areola) as possible into your baby's mouth.   More areola should be visible above your baby's upper lip than below the lower lip.   Your baby's tongue should be between his or her lower gum and your breast.   Ensure that your baby's mouth is correctly positioned  around your nipple (latched). Your baby's lips should create a seal on your breast and be turned out (everted).  It is common for your baby to suck about 2-3 minutes in order to start the flow of breast milk. Latching Teaching your baby how to latch on to your breast properly is very important. An improper latch can cause nipple pain and decreased milk supply for you and poor weight gain in your baby. Also, if your baby is not latched onto your nipple properly, he or she may swallow some air during feeding. This can make your baby fussy. Burping your baby when you switch breasts during the feeding can help to get rid of the air. However, teaching your baby to latch on properly is still the best way to prevent fussiness from swallowing air while breastfeeding. Signs that your baby has successfully latched on to your nipple:    Silent tugging or silent sucking, without causing you pain.   Swallowing heard between every 3-4 sucks.    Muscle movement above and in front of his or her ears while sucking.  Signs that your baby has not successfully latched on to nipple:   Sucking sounds or smacking sounds from your baby while breastfeeding.  Nipple pain. If you think your baby has not latched on correctly, slip your finger into the corner of your baby's mouth to break the suction and place it between your baby's gums. Attempt breastfeeding initiation again. Signs of Successful Breastfeeding Signs from your baby:   A gradual decrease in the number of sucks or complete cessation of sucking.   Falling asleep.   Relaxation of his or her body.   Retention of a small amount of milk in his or her mouth.   Letting go of your breast by himself or herself. Signs from you:  Breasts that have increased in firmness, weight, and size 1-3 hours after feeding.   Breasts that are softer immediately after breastfeeding.  Increased milk volume, as well as a change in milk consistency and color by  the fifth day of breastfeeding.   Nipples that are not sore, cracked, or bleeding. Signs That Your Baby is Getting Enough Milk  Wetting at least 3 diapers in a 24-hour period. The urine should be clear and pale yellow by age 5 days.  At least 3 stools in a 24-hour period by age 5 days. The stool should be soft and yellow.  At least 3 stools in a 24-hour period by age 7 days. The stool should be seedy and yellow.  No loss of weight greater than 10% of birth weight during the first 3 days of age.  Average weight gain of 4-7 ounces (113-198 g) per week after age 4 days.  Consistent daily weight gain by age 5 days, without weight loss after the age of 2 weeks. After a feeding, your baby may spit up a small amount. This is common. BREASTFEEDING FREQUENCY AND DURATION Frequent feeding will help you make more milk and can prevent sore nipples and breast engorgement. Breastfeed when you feel the need to reduce the fullness of your breasts   or when your baby shows signs of hunger. This is called "breastfeeding on demand." Avoid introducing a pacifier to your baby while you are working to establish breastfeeding (the first 4-6 weeks after your baby is born). After this time you may choose to use a pacifier. Research has shown that pacifier use during the first year of a baby's life decreases the risk of sudden infant death syndrome (SIDS). Allow your baby to feed on each breast as long as he or she wants. Breastfeed until your baby is finished feeding. When your baby unlatches or falls asleep while feeding from the first breast, offer the second breast. Because newborns are often sleepy in the first few weeks of life, you may need to awaken your baby to get him or her to feed. Breastfeeding times will vary from baby to baby. However, the following rules can serve as a guide to help you ensure that your baby is properly fed:  Newborns (babies 4 weeks of age or younger) may breastfeed every 1-3  hours.  Newborns should not go longer than 3 hours during the day or 5 hours during the night without breastfeeding.  You should breastfeed your baby a minimum of 8 times in a 24-hour period until you begin to introduce solid foods to your baby at around 6 months of age. BREAST MILK PUMPING Pumping and storing breast milk allows you to ensure that your baby is exclusively fed your breast milk, even at times when you are unable to breastfeed. This is especially important if you are going back to work while you are still breastfeeding or when you are not able to be present during feedings. Your lactation consultant can give you guidelines on how long it is safe to store breast milk.  A breast pump is a machine that allows you to pump milk from your breast into a sterile bottle. The pumped breast milk can then be stored in a refrigerator or freezer. Some breast pumps are operated by hand, while others use electricity. Ask your lactation consultant which type will work best for you. Breast pumps can be purchased, but some hospitals and breastfeeding support groups lease breast pumps on a monthly basis. A lactation consultant can teach you how to hand express breast milk, if you prefer not to use a pump.  CARING FOR YOUR BREASTS WHILE YOU BREASTFEED Nipples can become dry, cracked, and sore while breastfeeding. The following recommendations can help keep your breasts moisturized and healthy:  Avoid using soap on your nipples.   Wear a supportive bra. Although not required, special nursing bras and tank tops are designed to allow access to your breasts for breastfeeding without taking off your entire bra or top. Avoid wearing underwire-style bras or extremely tight bras.  Air dry your nipples for 3-4minutes after each feeding.   Use only cotton bra pads to absorb leaked breast milk. Leaking of breast milk between feedings is normal.   Use lanolin on your nipples after breastfeeding. Lanolin helps to  maintain your skin's normal moisture barrier. If you use pure lanolin, you do not need to wash it off before feeding your baby again. Pure lanolin is not toxic to your baby. You may also hand express a few drops of breast milk and gently massage that milk into your nipples and allow the milk to air dry. In the first few weeks after giving birth, some women experience extremely full breasts (engorgement). Engorgement can make your breasts feel heavy, warm, and tender to the   touch. Engorgement peaks within 3-5 days after you give birth. The following recommendations can help ease engorgement:  Completely empty your breasts while breastfeeding or pumping. You may want to start by applying warm, moist heat (in the shower or with warm water-soaked hand towels) just before feeding or pumping. This increases circulation and helps the milk flow. If your baby does not completely empty your breasts while breastfeeding, pump any extra milk after he or she is finished.  Wear a snug bra (nursing or regular) or tank top for 1-2 days to signal your body to slightly decrease milk production.  Apply ice packs to your breasts, unless this is too uncomfortable for you.  Make sure that your baby is latched on and positioned properly while breastfeeding. If engorgement persists after 48 hours of following these recommendations, contact your health care provider or a lactation consultant. OVERALL HEALTH CARE RECOMMENDATIONS WHILE BREASTFEEDING  Eat healthy foods. Alternate between meals and snacks, eating 3 of each per day. Because what you eat affects your breast milk, some of the foods may make your baby more irritable than usual. Avoid eating these foods if you are sure that they are negatively affecting your baby.  Drink milk, fruit juice, and water to satisfy your thirst (about 10 glasses a day).   Rest often, relax, and continue to take your prenatal vitamins to prevent fatigue, stress, and anemia.  Continue  breast self-awareness checks.  Avoid chewing and smoking tobacco.  Avoid alcohol and drug use. Some medicines that may be harmful to your baby can pass through breast milk. It is important to ask your health care provider before taking any medicine, including all over-the-counter and prescription medicine as well as vitamin and herbal supplements. It is possible to become pregnant while breastfeeding. If birth control is desired, ask your health care provider about options that will be safe for your baby. SEEK MEDICAL CARE IF:   You feel like you want to stop breastfeeding or have become frustrated with breastfeeding.  You have painful breasts or nipples.  Your nipples are cracked or bleeding.  Your breasts are red, tender, or warm.  You have a swollen area on either breast.  You have a fever or chills.  You have nausea or vomiting.  You have drainage other than breast milk from your nipples.  Your breasts do not become full before feedings by the fifth day after you give birth.  You feel sad and depressed.  Your baby is too sleepy to eat well.  Your baby is having trouble sleeping.   Your baby is wetting less than 3 diapers in a 24-hour period.  Your baby has less than 3 stools in a 24-hour period.  Your baby's skin or the white part of his or her eyes becomes yellow.   Your baby is not gaining weight by 5 days of age. SEEK IMMEDIATE MEDICAL CARE IF:   Your baby is overly tired (lethargic) and does not want to wake up and feed.  Your baby develops an unexplained fever. Document Released: 08/28/2005 Document Revised: 09/02/2013 Document Reviewed: 02/19/2013 Exit[employer] Patient Information 2015 [employer]. This information is not intended to replace advice given to you by your health care provider. Make sure you discuss any questions you have with your health care provider.  Vaginal Birth After Cesarean Delivery Vaginal birth after cesarean delivery (VBAC) is  giving birth vaginally after previously delivering a baby by a cesarean. In the past, if a woman had a cesarean delivery,   all births afterward would be done by cesarean delivery. This is no longer true. It can be safe for the mother to try a vaginal delivery after having a cesarean delivery.  It is important to discuss VBAC with your health care provider early in the pregnancy so you can understand the risks, benefits, and options. It will give you time to decide what is best in your particular case. The final decision about whether to have a VBAC or repeat cesarean delivery should be between you and your health care provider. Any changes in your health or your baby's health during your pregnancy may make it necessary to change your initial decision about VBAC.  WOMEN WHO PLAN TO HAVE A VBAC SHOULD CHECK WITH THEIR HEALTH CARE PROVIDER TO BE SURE THAT:  The previous cesarean delivery was done with a low transverse uterine cut (incision) (not a vertical classical incision).   The birth canal is big enough for the baby.   There were no other operations on the uterus.   An electronic fetal monitor (EFM) will be on at all times during labor.   An operating room will be available and ready in case an emergency cesarean delivery is needed.   A health care provider and surgical nursing staff will be available at all times during labor to be ready to do an emergency delivery cesarean if necessary.   An anesthesiologist will be present in case an emergency cesarean delivery is needed.   The nursery is prepared and has adequate personnel and necessary equipment available to care for the baby in case of an emergency cesarean delivery. BENEFITS OF VBAC  Shorter stay in the hospital.   Avoidance of risks associated with cesarean delivery, such as:  Surgical complications, such as opening of the incision or hernia in the incision.  Injury to other organs.  Fever. This can occur if an infection  develops after surgery. It can also occur as a reaction to the medicine given to make you numb during the surgery.  Less blood loss and need for blood transfusions.  Lower risk of blood clots and infection.  Shorter recovery.   Decreased risk for having to remove the uterus (hysterectomy).   Decreased risk for the placenta to completely or partially cover the opening of the uterus (placenta previa) with a future pregnancy.   Decrease risk in future labor and delivery. RISKS OF A VBAC  Tearing (rupture) of the uterus. This is occurs in less than 1% of VBACs. The risk of this happening is higher if:  Steps are taken to begin the labor process (induce labor) or stimulate or strengthen contractions (augment labor).   Medicine is used to soften (ripen) the cervix.  Having to remove the uterus (hysterectomy) if it ruptures. VBAC SHOULD NOT BE DONE IF:  The previous cesarean delivery was done with a vertical (classical) or T-shaped incision or you do not know what kind of incision was made.   You had a ruptured uterus.   You have had certain types of surgery on your uterus, such as removal of uterine fibroids. Ask your health care provider about other types of surgeries that prevent you from having a VBAC.  You have certain medical or childbirth (obstetrical) problems.   There are problems with the baby.   You have had two previous cesarean deliveries and no vaginal deliveries. OTHER FACTS TO KNOW ABOUT VBAC:  It is safe to have an epidural anesthetic with VBAC.   It is safe to   turn the baby from a breech position (attempt an external cephalic version).   It is safe to try a VBAC with twins.   VBAC may not be successful if your baby weights 8.8 lb (4 kg) or more. However, weight predictions are not always accurate and should not be used alone to decide if VBAC is right for you.  There is an increased failure rate if the time between the cesarean delivery and VBAC is  less than 19 months.   Your health care provider may advise against a VBAC if you have preeclampsia (high blood pressure, protein in the urine, and swelling of face and extremities).   VBAC is often successful if you previously gave birth vaginally.   VBAC is often successful when the labor starts spontaneously before the due date.   Delivering a baby through a VBAC is similar to having a normal spontaneous vaginal delivery. Document Released: 02/18/2007 Document Revised: 01/12/2014 Document Reviewed: 03/27/2013 Exit[employer] Patient Information 2015 [employer]. This information is not intended to replace advice given to you by your health care provider. Make sure you discuss any questions you have with your health care provider.  

## 2015-03-26 NOTE — Progress Notes (Signed)
Subjective:  Crystal [name] is a 29 y.o. G3P1011 at 105w0d being seen today for ongoing prenatal care.  Patient reports no complaints.  Contractions: Not present.  Vag. Bleeding: None. Movement: Present. Denies leaking of fluid.   The following portions of the patient's history were reviewed and updated as appropriate: allergies, current medications, past family history, past medical history, past social history, past surgical history and problem list.   Objective:   Filed Vitals:   03/26/15 0839  BP: 129/74  Pulse: 92  Temp: 98.4 F (36.9 C)  Weight: 306 lb (138.801 kg)    Fetal Status: Fetal Heart Rate (bpm): 129 Fundal Height: 33 cm Movement: Present     General:  Alert, oriented and cooperative. Patient is in no acute distress.  Skin: Skin is warm and dry. No rash noted.   Cardiovascular: Normal heart rate noted  Respiratory: Normal respiratory effort, no problems with respiration noted  Abdomen: Soft, gravid, appropriate for gestational age. Pain/Pressure: Absent     Vaginal: Vag. Bleeding: None.    Vag D/C Character: Thin  Extremities: Normal range of motion.  Edema: None  Mental Status: Normal mood and affect. Normal behavior. Normal judgment and thought content.     Assessment and Plan:  Pregnancy: G3P1011 at [redacted]w[redacted]d  1. Encounter for supervision of other normal pregnancy in third trimester Continue routine prenatal care.   2. Previous cesarean delivery affecting pregnancy, antepartum [facility] Attending Consult Note  Risk of uterine rupture at term is 0.78 percent with TOLAC and 0.22 percent with ERCD. 1 in 10 uterine ruptures will result in neonatal death or neurological injury. The benefits of a trial of labor after cesarean (TOLAC) resulting in a vaginal birth after cesarean (VBAC) include the following: shorter length of hospital stay and postpartum recovery (in most cases); fewer complications, such as postpartum fever, wound or uterine infection,  thromboembolism (blood clots in the leg or lung), need for blood transfusion and fewer neonatal breathing problems. The risks of an attempted VBAC or TOLAC include the following: Risk of failed trial of labor after cesarean (TOLAC) without a vaginal birth after cesarean (VBAC) resulting in repeat cesarean delivery (RCD) in about 20 to 40 percent of women who attempt VBAC.  Risk of rupture of uterus resulting in an emergency cesarean delivery. The risk of uterine rupture may be related in part to the type of uterine incision made during the first cesarean delivery. A previous transverse uterine incision has the lowest risk of rupture (0.2 to 1.5 percent risk). Vertical or T-shaped uterine incisions have a higher risk of uterine rupture (4 to 9 percent risk)The risk of fetal death is very low with both VBAC and elective repeat cesarean delivery (ERCD), but the likelihood of fetal death is higher with VBAC than with ERCD. Maternal death is very rare with either type of delivery. The risks of an elective repeat cesarean delivery (ERCD) were reviewed with the patient including but not limited to: 10/998 risk of uterine rupture which could have serious consequences, bleeding which may require transfusion; infection which may require antibiotics; injury to bowel, bladder or other surrounding organs (bowel, bladder, ureters); injury to the fetus; need for additional procedures including hysterectomy in the event of a life-threatening hemorrhage; thromboembolic phenomenon; abnormal placentation; incisional problems; death and other postoperative or anesthesia complications.    These risks and benefits are summarized on the consent form, which was reviewed with the patient during the visit.  All her questions answered and she signed  a consent indicating a preference for TOLAC. A copy of the consent was scanned to the chart   Preterm labor symptoms and general obstetric precautions including but not limited to vaginal  bleeding, contractions, leaking of fluid and fetal movement were reviewed in detail with the patient. Please refer to After Visit Summary for other counseling recommendations.  Return in 2 weeks (on 04/09/2015).   Reva Boresanya S Nalina Yeatman, MD

## 2015-04-09 ENCOUNTER — Encounter (HOSPITAL_COMMUNITY): Payer: Self-pay

## 2015-04-09 ENCOUNTER — Inpatient Hospital Stay (HOSPITAL_COMMUNITY)
Admission: AD | Admit: 2015-04-09 | Discharge: 2015-04-09 | Disposition: A | Discharge status: Home / Self Care | Payer: 59 | Source: Ambulatory Visit | Attending: Obstetrics & Gynecology | Admitting: Obstetrics & Gynecology

## 2015-04-09 DIAGNOSIS — O209 Hemorrhage in early pregnancy, unspecified: Secondary | ICD-10-CM | POA: Insufficient documentation

## 2015-04-09 DIAGNOSIS — O26859 Spotting complicating pregnancy, unspecified trimester: Secondary | ICD-10-CM | POA: Diagnosis not present

## 2015-04-09 DIAGNOSIS — Z87891 Personal history of nicotine dependence: Secondary | ICD-10-CM | POA: Diagnosis not present

## 2015-04-09 DIAGNOSIS — Z3A33 33 weeks gestation of pregnancy: Secondary | ICD-10-CM | POA: Insufficient documentation

## 2015-04-09 DIAGNOSIS — Z0000 Encounter for general adult medical examination without abnormal findings: Secondary | ICD-10-CM

## 2015-04-09 DIAGNOSIS — R109 Unspecified abdominal pain: Secondary | ICD-10-CM | POA: Diagnosis present

## 2015-04-09 LAB — WET PREP, GENITAL
Clue Cells Wet Prep HPF POC: NONE SEEN
Trich, Wet Prep: NONE SEEN
Yeast Wet Prep HPF POC: NONE SEEN

## 2015-04-09 LAB — URINALYSIS, ROUTINE W REFLEX MICROSCOPIC
Bilirubin Urine: NEGATIVE
Glucose, UA: NEGATIVE mg/dL
Hgb urine dipstick: NEGATIVE
Ketones, ur: NEGATIVE mg/dL
Leukocytes, UA: NEGATIVE
Nitrite: NEGATIVE
Protein, ur: NEGATIVE mg/dL
Specific Gravity, Urine: 1.02 (ref 1.005–1.030)
Urobilinogen, UA: 0.2 mg/dL (ref 0.0–1.0)
pH: 7.5 (ref 5.0–8.0)

## 2015-04-09 NOTE — MAU Note (Signed)
Spotting this am , no spotting now, two blood slots, cramping pain,

## 2015-04-09 NOTE — Discharge Instructions (Signed)
Future Appointments Date Time Provider Department Center  04/12/2015 2:45 PM Abram Sander, MD [facility]      Third Trimester of Pregnancy The third trimester is from week 29 through week 42, months 7 through 9. The third trimester is a time when the fetus is growing rapidly. At the end of the ninth month, the fetus is about 20 inches in length and weighs 6-10 pounds.  BODY CHANGES Your body goes through many changes during pregnancy. The changes vary from woman to woman.   Your weight will continue to increase. You can expect to gain 25-35 pounds (11-16 kg) by the end of the pregnancy.  You may begin to get stretch marks on your hips, abdomen, and breasts.  You may urinate more often because the fetus is moving lower into your pelvis and pressing on your bladder.  You may develop or continue to have heartburn as a result of your pregnancy.  You may develop constipation because certain hormones are causing the muscles that push waste through your intestines to slow down.  You may develop hemorrhoids or swollen, bulging veins (varicose veins).  You may have pelvic pain because of the weight gain and pregnancy hormones relaxing your joints between the bones in your pelvis. Backaches may result from overexertion of the muscles supporting your posture.  You may have changes in your hair. These can include thickening of your hair, rapid growth, and changes in texture. Some women also have hair loss during or after pregnancy, or hair that feels dry or thin. Your hair will most likely return to normal after your baby is born.  Your breasts will continue to grow and be tender. A yellow discharge may leak from your breasts called colostrum.  Your belly button may stick out.  You may feel short of breath because of your expanding uterus.  You may notice the fetus "dropping," or moving lower in your abdomen.  You may have a bloody mucus discharge. This usually occurs a few days to a week  before labor begins.  Your cervix becomes thin and soft (effaced) near your due date. WHAT TO EXPECT AT YOUR PRENATAL EXAMS  You will have prenatal exams every 2 weeks until week 36. Then, you will have weekly prenatal exams. During a routine prenatal visit:  You will be weighed to make sure you and the fetus are growing normally.  Your blood pressure is taken.  Your abdomen will be measured to track your baby's growth.  The fetal heartbeat will be listened to.  Any test results from the previous visit will be discussed.  You may have a cervical check near your due date to see if you have effaced. At around 36 weeks, your caregiver will check your cervix. At the same time, your caregiver will also perform a test on the secretions of the vaginal tissue. This test is to determine if a type of bacteria, Group B streptococcus, is present. Your caregiver will explain this further. Your caregiver may ask you:  What your birth plan is.  How you are feeling.  If you are feeling the baby move.  If you have had any abnormal symptoms, such as leaking fluid, bleeding, severe headaches, or abdominal cramping.  If you have any questions. Other tests or screenings that may be performed during your third trimester include:  Blood tests that check for low iron levels (anemia).  Fetal testing to check the health, activity level, and growth of the fetus. Testing is done if  you have certain medical conditions or if there are problems during the pregnancy. FALSE LABOR You may feel small, irregular contractions that eventually go away. These are called Braxton Hicks contractions, or false labor. Contractions may last for hours, days, or even weeks before true labor sets in. If contractions come at regular intervals, intensify, or become painful, it is best to be seen by your caregiver.  SIGNS OF LABOR   Menstrual-like cramps.  Contractions that are 5 minutes apart or less.  Contractions that  start on the top of the uterus and spread down to the lower abdomen and back.  A sense of increased pelvic pressure or back pain.  A watery or bloody mucus discharge that comes from the vagina. If you have any of these signs before the 37th week of pregnancy, call your caregiver right away. You need to go to the hospital to get checked immediately. HOME CARE INSTRUCTIONS   Avoid all smoking, herbs, alcohol, and unprescribed drugs. These chemicals affect the formation and growth of the baby.  Follow your caregiver's instructions regarding medicine use. There are medicines that are either safe or unsafe to take during pregnancy.  Exercise only as directed by your caregiver. Experiencing uterine cramps is a good sign to stop exercising.  Continue to eat regular, healthy meals.  Wear a good support bra for breast tenderness.  Do not use hot tubs, steam rooms, or saunas.  Wear your seat belt at all times when driving.  Avoid raw meat, uncooked cheese, cat litter boxes, and soil used by cats. These carry germs that can cause birth defects in the baby.  Take your prenatal vitamins.  Try taking a stool softener (if your caregiver approves) if you develop constipation. Eat more high-fiber foods, such as fresh vegetables or fruit and whole grains. Drink plenty of fluids to keep your urine clear or pale yellow.  Take warm sitz baths to soothe any pain or discomfort caused by hemorrhoids. Use hemorrhoid cream if your caregiver approves.  If you develop varicose veins, wear support hose. Elevate your feet for 15 minutes, 3-4 times a day. Limit salt in your diet.  Avoid heavy lifting, wear low heal shoes, and practice good posture.  Rest a lot with your legs elevated if you have leg cramps or low back pain.  Visit your dentist if you have not gone during your pregnancy. Use a soft toothbrush to brush your teeth and be gentle when you floss.  A sexual relationship may be continued unless your  caregiver directs you otherwise.  Do not travel far distances unless it is absolutely necessary and only with the approval of your caregiver.  Take prenatal classes to understand, practice, and ask questions about the labor and delivery.  Make a trial run to the hospital.  Pack your hospital bag.  Prepare the baby's nursery.  Continue to go to all your prenatal visits as directed by your caregiver. SEEK MEDICAL CARE IF:  You are unsure if you are in labor or if your water has broken.  You have dizziness.  You have mild pelvic cramps, pelvic pressure, or nagging pain in your abdominal area.  You have persistent nausea, vomiting, or diarrhea.  You have a bad smelling vaginal discharge.  You have pain with urination. SEEK IMMEDIATE MEDICAL CARE IF:   You have a fever.  You are leaking fluid from your vagina.  You have spotting or bleeding from your vagina.  You have severe abdominal cramping or pain.  You  have rapid weight loss or gain.  You have shortness of breath with chest pain.  You notice sudden or extreme swelling of your face, hands, ankles, feet, or legs.  You have not felt your baby move in over an hour.  You have severe headaches that do not go away with medicine.  You have vision changes. Document Released: 08/22/2001 Document Revised: 09/02/2013 Document Reviewed: 10/29/2012 [facility] Patient Information 2015 [place], [place]. This information is not intended to replace advice given to you by your health care provider. Make sure you discuss any questions you have with your health care provider.   Placental Abruption Your placenta is the organ that nourishes your unborn baby (fetus). Your baby gets his or her blood supply and nutrients through your placenta. It is your baby's life support system. It is attached to the inside of your uterus until after your baby is born.  Placental abruption is when the placenta partly or completely separates from the uterus  before your baby is born. This is rare, but it can happen any time after 20 weeks of pregnancy. A small separation may not cause problems, but a large separation may be dangerous for you and your baby. CAUSES  Most of the time the cause of a placental abruption is unknown. Though it is rare, a placental abruption can be caused by:   An abdominal injury.   The baby turning from a buttocks-first position (breech presentation) or a sideways position (transverse) to a headfirst position (cephalic).   Delivering the first of multiple babies (twins, triplets, or more).   Sudden loss of amniotic fluid (premature rupture of the membranes).   An abnormally short umbilical cord. RISK FACTORS Some risk factors make a placental abruption more likely, including:  History of placental abruption.  High blood pressure (hypertension).  Smoking.  Alcohol intake.  Blood clotting problems.  Too much amniotic fluid.  Having had multiples (twins or triplets or more).  Seizures and convulsions.  Diabetes mellitus.  Having had more than four children.  Age 30 years or older.  Illegal drug use.  Injury to your abdomen. SIGNS AND SYMPTOMS  A small placental abruption may not cause symptoms. If you do have symptoms, they may include:  Mild abdominal pain.  Slight vaginal bleeding. Symptoms of severe placental abruption depend on the size of the separation and the stage of pregnancy. Symptoms may include:   Sudden pain in your uterus.  Abdominal pain.  Vaginal bleeding.  Tender uterus.  Severe abdominal pain with tenderness.  Continual contractions of your uterus.  Back pain.  Weakness, light-headedness. DIAGNOSIS  Placental abruption is suspected when a pregnant woman develops sudden pain in her uterus. The health care provider will check whether the uterus is very tender, hard, and enlarging and whether the baby has an abnormal heart rate or rhythm. Ultrasonography  (commonly called an ultrasound) will be done. Blood work will also be done to make sure that there are enough healthy red blood cells and that there are no clotting problems or signs of too much blood loss. TREATMENT  Placental abruption is usually an emergency. It requires treatment right away. Your treatment will depend on:   The amount of bleeding.  Whether you or you baby are in distress.  The stage of your pregnancy.  The maturity of the baby. Treatment for partial separation of the placenta is bed rest and close observation. You also may need a blood transfusion or to receive fluids through an IV tube. Treatment  for complete placental separation is delivery of your baby. You may have a cesarean delivery if your baby is in distress. HOME CARE INSTRUCTIONS   Only take medicines as directed by your health care provider.  Arrange for help at home before and after you deliver the baby, especially if you had a cesarean delivery or lost a lot of blood.  Get plenty of rest and sleep.  Do not have sexual intercourse until your health care provider says it is okay.  Do not use tampons or douche unless your health care provider says it is okay. SEEK MEDICAL CARE IF:  You have light vaginal bleeding or spotting.  You have any type of trauma, such as a fall or jolt during an accident.  You are having trouble avoiding drugs, alcohol, or smoking. SEEK IMMEDIATE MEDICAL CARE IF:  You have vaginal bleeding.  You have abdominal pain.  You have continuous uterine contractions.  You have a hard, tender uterus.  You do not feel the baby move, or the baby moves very little. MAKE SURE YOU:  Understand these instructions.  Will watch your condition.  Will get help right away if you are not doing well or get worse. Document Released: 08/28/2005 Document Revised: 09/02/2013 Document Reviewed: 06/20/2013 [facility] Patient Information 2015 [place], [place]. This information is not intended  to replace advice given to you by your health care provider. Make sure you discuss any questions you have with your health care provider.

## 2015-04-09 NOTE — MAU Provider Note (Signed)
History     CSN: [id number]  Arrival date and time: 04/09/15 1036   First Provider Initiated Contact with Patient 04/09/15 1114      Chief Complaint  Patient presents with  . Abdominal Cramping  . Vaginal Bleeding   HPI Comments: Pt reports that this morning while in the shower, she had a small blood clot . She has noted no additional bleeding since that time. She states that she has had some mild uterine cramping that has been present over the last few weeks, but has not gotten any worse. She was told they were Braxton-Hicks. She also has recurrent tension HAs but is otherwise w/o symptoms.  Abdominal Cramping Associated symptoms include headaches. Pertinent negatives include no frequency, nausea or vomiting.  Vaginal Bleeding Associated symptoms include headaches. Pertinent negatives include no abdominal pain, frequency, nausea, urgency or vomiting.    OB History    Gravida Para Term Preterm AB TAB SAB Ectopic Multiple Living   0 Past Medical History  Diagnosis Date  . Allergy   . PCOS (polycystic ovarian syndrome)     No radiological confirmation  . Irregular menses   . Hx of chlamydia infection   . H/O varicella   . History of PID   . Obese   . Status post primary low transverse cesarean section - 12/7 08/17/2012    Presumed macrosomia - FT progress with NRFT   . Postpartum care following cesarean delivery 08/19/2012    Past Surgical History  Procedure Laterality Date  . Cesarean section  08/18/2012    Procedure: CESAREAN SECTION;  Surgeon: Tresa Endo A. Ernestina Penna, MD;  Location: [facility];  Service: Obstetrics;  Laterality: N/A;  Primary Cesarean Section     Family History  Problem Relation Age of Onset  . Heart disease Mother   . Hypertension Mother   . Diabetes Mother   . Asthma Mother   . Heart disease Father   . Hypertension Father   . Diabetes Father   . Asthma Father   . Diabetes Maternal Grandmother   . Hypertension Maternal Grandmother    . Cancer Maternal Grandmother     urethral, lung    History  Substance Use Topics  . Smoking status: Former Smoker    Types: Cigarettes  . Smokeless tobacco: Never Used  . Alcohol Use: Yes    Allergies: No Known Allergies  Prescriptions prior to admission  Medication Sig Dispense Refill Last Dose  . Prenatal Vit-Fe Fumarate-FA (PRENATAL MULTIVITAMIN) TABS tablet Take 1 tablet by mouth daily at 12 noon.   04/09/2015 at Unknown time    Review of Systems  Respiratory: Negative for shortness of breath.   Cardiovascular: Positive for leg swelling. Negative for chest pain.  Gastrointestinal: Negative for heartburn, nausea, vomiting and abdominal pain.  Genitourinary: Positive for vaginal bleeding. Negative for urgency and frequency.  Neurological: Positive for headaches. Negative for weakness.   Physical Exam   Blood pressure 153/71, pulse 78, temperature 98.2 F (36.8 C), temperature source Oral, resp. rate 20, height 5' 5.5" (1.664 m), weight 138.347 kg (305 lb), last menstrual period 07/21/2014.  Physical Exam  Constitutional: She is oriented to person, place, and time. She appears well-developed and well-nourished.  HENT:  Head: Normocephalic and atraumatic.  Eyes: Conjunctivae and EOM are normal.  Neck: Normal range of motion. No tracheal deviation present.  Cardiovascular: Intact distal pulses.   Respiratory: Effort normal. No respiratory distress.  GI: Soft. She exhibits no distension and no mass. There is no tenderness. There is no rebound and no guarding.  Genitourinary: Uterus normal.  Sterile spec exam completed; moderate thin white discharge noted w/o noticeable odor; no bleeding or polyps seen  Musculoskeletal: Normal range of motion.  Neurological: She is alert and oriented to person, place, and time.  Skin: Skin is warm and dry.  Psychiatric: She has a normal mood and affect. Her behavior is normal. Judgment and thought content normal.  FHR by dopplar  130s NST: FHR 130s, moderate variability; accelerations present, no decels or contractions seen  MAU Course  Procedures  MDM Sterile Spec exam  Assessment and Plan  Pt is a 29 yo f G3P1011 at [redacted]w[redacted]d who presents to the MAU w/ complaints of blood clot via vagina x1 this morning; she is having some mild cramping, leg swelling, and HA, but these have been recurrent throughout the pregnancy and she is otherwise w/o complaint. Per [place] on 6/8, placenta is posterior above the cervix.  Vaginal bleeding #DDx: cervical polyps, cervical friability, placental abruption, normal spotting during pregnancy, vaginal infx #no source of bleeding seen on spec exam and placenta is posterior; there was a moderate amount of vaginal d/c, will order wet prep to assess for BV- this was neg #giving reassuring strip and minimal amount of bleeding per Hx, it is not likely that she is experiencing an abruption #will d/c home w/ labor precautions and information re placental abruption  Lowanda Foster 04/09/2015, 11:14 AM   OB fellow attestation:  I have seen and examined this patient; I agree with above documentation in the resident's note.   BRYLIN STOPPER is a 29 y.o. G3P1011 reporting vaginal spotting/clot this AM +FM, denies LOF, contractions, vaginal discharge.  PE: BP 112/62 mmHg  Pulse 94  Temp(Src) 98.2 F (36.8 C) (Oral)  Resp 18  Ht 5' 5.5" (1.664 m)  Wt 305 lb (138.347 kg)  BMI 49.96 kg/m2  LMP 07/21/2014 Gen: calm comfortable, NAD Resp: normal effort, no distress Abd: gravid  ROS, labs, PMH reviewed NST reactive - 130/mod/accels  Plan: - fetal kick counts reinforced, preterm labor precautions. Abruption precautions reviewed - has follow up at [facility] practice on Monday  Federico Flake, MD 1:11 PM

## 2015-04-12 ENCOUNTER — Ambulatory Visit (INDEPENDENT_AMBULATORY_CARE_PROVIDER_SITE_OTHER): Payer: 59 | Admitting: Family Medicine

## 2015-04-12 VITALS — BP 118/61 | HR 85 | Temp 98.2°F | Wt 304.0 lb

## 2015-04-12 DIAGNOSIS — O26849 Uterine size-date discrepancy, unspecified trimester: Secondary | ICD-10-CM | POA: Insufficient documentation

## 2015-04-12 DIAGNOSIS — O3660X0 Maternal care for excessive fetal growth, unspecified trimester, not applicable or unspecified: Secondary | ICD-10-CM

## 2015-04-12 NOTE — Patient Instructions (Signed)
Fetal Movement Counts Performing a fetal movement count is highly recommended in high-risk pregnancies, but it is good for every pregnant woman to do. Your health care provider may ask you to start counting fetal movements at 28 weeks of the pregnancy. Fetal movements often increase:  After eating a full meal.  After physical activity.  After eating or drinking something sweet or cold.  At rest. Pay attention to when you feel the baby is most active. This will help you notice a pattern of your baby's sleep and wake cycles and what factors contribute to an increase in fetal movement. It is important to perform a fetal movement count at the same time each day when your baby is normally most active.  HOW TO COUNT FETAL MOVEMENTS 1. Find a quiet and comfortable area to sit or lie down on your left side. Lying on your left side provides the best blood and oxygen circulation to your baby. 2. Write down the day and time on a sheet of paper or in a journal. 3. Start counting kicks, flutters, swishes, rolls, or jabs in a 2-hour period. You should feel at least 10 movements within 2 hours. 4. If you do not feel 10 movements in 2 hours, wait 2-3 hours and count again. Look for a change in the pattern or not enough counts in 2 hours. SEEK MEDICAL CARE IF:  You feel less than 10 counts in 2 hours, tried twice.  There is no movement in over an hour.  The pattern is changing or taking longer each day to reach 10 counts in 2 hours.  You feel the baby is not moving as he or she usually does. Document Released: 09/27/2006 Document Revised: 01/12/2014 Document Reviewed: 06/24/2012 Exit[employer] Patient Information 2015 [employer]. This information is not intended to replace advice given to you by your health care provider. Make sure you discuss any questions you have with your health care provider.  

## 2015-04-12 NOTE — Progress Notes (Signed)
AYLYN WENZLER is a 29 y.o. G3P1011 at [redacted]w[redacted]d for routine follow up.  She reports spotting on Friday, resolved and none since. Good FM See flow sheet for details.  A/P: Pregnancy at [redacted]w[redacted]d.  Doing well.   Pregnancy issues include size>dates, desires vbac  Infant feeding choice breast Contraception choice mirena maybe, still undecided Infant circumcision desired not applicable  Tdapwas not given today. GBS/GC/CZ testing was not performed today.  Preterm labor precautions reviewed. Safe sleep discussed. Kick counts reviewed. Follow up 2 weeks.

## 2015-04-26 ENCOUNTER — Ambulatory Visit (HOSPITAL_COMMUNITY)
Admission: RE | Admit: 2015-04-26 | Discharge: 2015-04-26 | Disposition: A | Discharge status: Home / Self Care | Payer: 59 | Source: Ambulatory Visit | Attending: Family Medicine | Admitting: Family Medicine

## 2015-04-26 DIAGNOSIS — O26849 Uterine size-date discrepancy, unspecified trimester: Secondary | ICD-10-CM

## 2015-04-26 DIAGNOSIS — O3660X0 Maternal care for excessive fetal growth, unspecified trimester, not applicable or unspecified: Secondary | ICD-10-CM | POA: Diagnosis present

## 2015-04-28 ENCOUNTER — Ambulatory Visit (INDEPENDENT_AMBULATORY_CARE_PROVIDER_SITE_OTHER): Payer: 59 | Admitting: Family Medicine

## 2015-04-28 ENCOUNTER — Other Ambulatory Visit (HOSPITAL_COMMUNITY)
Admission: RE | Admit: 2015-04-28 | Discharge: 2015-04-28 | Disposition: A | Discharge status: Home / Self Care | Payer: 59 | Source: Ambulatory Visit | Attending: Family Medicine | Admitting: Family Medicine

## 2015-04-28 VITALS — BP 130/64 | HR 89 | Temp 98.2°F | Wt 308.0 lb

## 2015-04-28 DIAGNOSIS — Z113 Encounter for screening for infections with a predominantly sexual mode of transmission: Secondary | ICD-10-CM | POA: Diagnosis not present

## 2015-04-28 DIAGNOSIS — O3663X1 Maternal care for excessive fetal growth, third trimester, fetus 1: Secondary | ICD-10-CM

## 2015-04-28 DIAGNOSIS — Z3483 Encounter for supervision of other normal pregnancy, third trimester: Secondary | ICD-10-CM

## 2015-04-28 NOTE — Patient Instructions (Signed)
Braxton Hicks Contractions °Contractions of the uterus can occur throughout pregnancy. Contractions are not always a sign that you are in labor.  °WHAT ARE BRAXTON HICKS CONTRACTIONS?  °Contractions that occur before labor are called Braxton Hicks contractions, or false labor. Toward the end of pregnancy (32-34 weeks), these contractions can develop more often and may become more forceful. This is not true labor because these contractions do not result in opening (dilatation) and thinning of the cervix. They are sometimes difficult to tell apart from true labor because these contractions can be forceful and people have different pain tolerances. You should not feel embarrassed if you go to the hospital with false labor. Sometimes, the only way to tell if you are in true labor is for your health care provider to look for changes in the cervix. °If there are no prenatal problems or other health problems associated with the pregnancy, it is completely safe to be sent home with false labor and await the onset of true labor. °HOW CAN YOU TELL THE DIFFERENCE BETWEEN TRUE AND FALSE LABOR? °False Labor °· The contractions of false labor are usually shorter and not as hard as those of true labor.   °· The contractions are usually irregular.   °· The contractions are often felt in the front of the lower abdomen and in the groin.   °· The contractions may go away when you walk around or change positions while lying down.   °· The contractions get weaker and are shorter lasting as time goes on.   °· The contractions do not usually become progressively stronger, regular, and closer together as with true labor.   °True Labor °· Contractions in true labor last 30-70 seconds, become very regular, usually become more intense, and increase in frequency.   °· The contractions do not go away with walking.   °· The discomfort is usually felt in the top of the uterus and spreads to the lower abdomen and low back.   °· True labor can be  determined by your health care provider with an exam. This will show that the cervix is dilating and getting thinner.   °WHAT TO REMEMBER °· Keep up with your usual exercises and follow other instructions given by your health care provider.   °· Take medicines as directed by your health care provider.   °· Keep your regular prenatal appointments.   °· Eat and drink lightly if you think you are going into labor.   °· If Braxton Hicks contractions are making you uncomfortable:   °¨ Change your position from lying down or resting to walking, or from walking to resting.   °¨ Sit and rest in a tub of warm water.   °¨ Drink 2-3 glasses of water. Dehydration may cause these contractions.   °¨ Do slow and deep breathing several times an hour.   °WHEN SHOULD I SEEK IMMEDIATE MEDICAL CARE? °Seek immediate medical care if: °· Your contractions become stronger, more regular, and closer together.   °· You have fluid leaking or gushing from your vagina.   °· You have a fever.   °· You pass blood-tinged mucus.   °· You have vaginal bleeding.   °· You have continuous abdominal pain.   °· You have low back pain that you never had before.   °· You feel your baby's head pushing down and causing pelvic pressure.   °· Your baby is not moving as much as it used to.   °Document Released: 08/28/2005 Document Revised: 09/02/2013 Document Reviewed: 06/09/2013 °[employer]® Patient Information ©2015 [employer]. This information is not intended to replace advice given to you by your health care   provider. Make sure you discuss any questions you have with your health care provider. ° °

## 2015-04-29 DIAGNOSIS — O3660X0 Maternal care for excessive fetal growth, unspecified trimester, not applicable or unspecified: Secondary | ICD-10-CM | POA: Insufficient documentation

## 2015-04-29 NOTE — Progress Notes (Signed)
KALIFA CADDEN is a 29 y.o. G3P1011 at [redacted]w[redacted]d for routine follow up.  She reports cramping/vaginal pressure with standing. See flow sheet for details.  A/P: Pregnancy at [redacted]w[redacted]d.  Doing well.   Pregnancy issues include LGA  Infant feeding choice breast Contraception choice considering mirena but undecided Infant circumcision desired not applicable  Tdapwas not given today. Received 03/03/15 GBS/GC/CZ testing was performed today.  Preterm labor precautions reviewed. Safe sleep discussed. Kick counts reviewed. Follow up 2 weeks.

## 2015-04-30 LAB — CERVICOVAGINAL ANCILLARY ONLY
Chlamydia: NEGATIVE
Neisseria Gonorrhea: NEGATIVE

## 2015-04-30 LAB — STREP B DNA PROBE: GBSP: NOT DETECTED

## 2015-05-03 ENCOUNTER — Telehealth: Payer: Self-pay | Admitting: Family Medicine

## 2015-05-03 NOTE — Telephone Encounter (Signed)
Pt calling and states that she needs an OB visit appt with Dr. Richarda Blade for this Wednesday as a double-booked appt. Sending a message to get permission so that I may schedule the pt for an appt.

## 2015-05-03 NOTE — Telephone Encounter (Signed)
Please double book her this week whenever is good for her

## 2015-05-03 NOTE — Telephone Encounter (Signed)
Looks like patient was told to follow up in 2 weeks at last appointment, will forward to MD to clarify.

## 2015-05-03 NOTE — Telephone Encounter (Signed)
Patient will come in 8/24 at 1:45pm, schedule will not allow me to double book. Will forward to [facility].

## 2015-05-04 ENCOUNTER — Encounter (HOSPITAL_COMMUNITY): Payer: Self-pay | Admitting: *Deleted

## 2015-05-05 ENCOUNTER — Ambulatory Visit (INDEPENDENT_AMBULATORY_CARE_PROVIDER_SITE_OTHER): Payer: 59 | Admitting: Family Medicine

## 2015-05-05 VITALS — BP 124/62 | HR 85 | Temp 98.5°F | Wt 308.5 lb

## 2015-05-05 DIAGNOSIS — Z3483 Encounter for supervision of other normal pregnancy, third trimester: Secondary | ICD-10-CM

## 2015-05-05 LAB — CBC
HCT: 29.9 % — ABNORMAL LOW (ref 36.0–46.0)
Hemoglobin: 9.7 g/dL — ABNORMAL LOW (ref 12.0–15.0)
MCH: 24.1 pg — ABNORMAL LOW (ref 26.0–34.0)
MCHC: 32.4 g/dL (ref 30.0–36.0)
MCV: 74.4 fL — ABNORMAL LOW (ref 78.0–100.0)
MPV: 9.2 fL (ref 8.6–12.4)
Platelets: 260 10*3/uL (ref 150–400)
RBC: 4.02 MIL/uL (ref 3.87–5.11)
RDW: 15.9 % — ABNORMAL HIGH (ref 11.5–15.5)
WBC: 9.1 10*3/uL (ref 4.0–10.5)

## 2015-05-05 LAB — COMPREHENSIVE METABOLIC PANEL
ALT: 11 U/L (ref 6–29)
AST: 17 U/L (ref 10–30)
Albumin: 3.2 g/dL — ABNORMAL LOW (ref 3.6–5.1)
Alkaline Phosphatase: 66 U/L (ref 33–115)
BUN: 5 mg/dL — ABNORMAL LOW (ref 7–25)
CO2: 20 mmol/L (ref 20–31)
Calcium: 8.5 mg/dL — ABNORMAL LOW (ref 8.6–10.2)
Chloride: 106 mmol/L (ref 98–110)
Creat: 0.56 mg/dL (ref 0.50–1.10)
Glucose, Bld: 83 mg/dL (ref 65–99)
Potassium: 3.8 mmol/L (ref 3.5–5.3)
Sodium: 136 mmol/L (ref 135–146)
Total Bilirubin: 0.3 mg/dL (ref 0.2–1.2)
Total Protein: 6 g/dL — ABNORMAL LOW (ref 6.1–8.1)

## 2015-05-05 LAB — POCT URINALYSIS DIPSTICK
Bilirubin, UA: NEGATIVE
Blood, UA: NEGATIVE
Glucose, UA: NEGATIVE
Leukocytes, UA: NEGATIVE
Nitrite, UA: NEGATIVE
Protein, UA: 30
Spec Grav, UA: 1.025
Urobilinogen, UA: 1
pH, UA: 7

## 2015-05-05 NOTE — Patient Instructions (Signed)

## 2015-05-05 NOTE — Progress Notes (Deleted)
  Subjective:    Crystal [name] is a 29 y.o. G3P1011 [redacted]w[redacted]d being seen today for her obstetrical visit.  Patient reports {sx:14538}. Fetal movement: {fetal movement:14572}.  Objective:    BP 141/85 mmHg  Pulse 85  Temp(Src) 98.5 F (36.9 C)  Wt 308 lb 8 oz (139.935 kg)  LMP 07/21/2014  Physical Exam  Exam  FHT:    Uterine Size:    Presentation:       Assessment:    Pregnancy:  G3P1011    Plan:

## 2015-05-06 ENCOUNTER — Telehealth: Payer: Self-pay | Admitting: *Deleted

## 2015-05-06 ENCOUNTER — Other Ambulatory Visit: Payer: Self-pay | Admitting: Family Medicine

## 2015-05-06 DIAGNOSIS — D509 Iron deficiency anemia, unspecified: Secondary | ICD-10-CM | POA: Insufficient documentation

## 2015-05-06 LAB — PROTEIN / CREATININE RATIO, URINE
Creatinine, Urine: 234.3 mg/dL
Protein Creatinine Ratio: 0.12 (ref <0.15)
Total Protein, Urine: 27 mg/dL — ABNORMAL HIGH (ref 5–24)

## 2015-05-06 MED ORDER — FERROUS SULFATE 325 (65 FE) MG PO TABS: 325.0000 mg | ORAL_TABLET | Freq: Every day | ORAL | Status: DC

## 2015-05-06 NOTE — Telephone Encounter (Signed)
Patient informed of message from MD, expressed understanding. 

## 2015-05-06 NOTE — Telephone Encounter (Signed)
-----   Message from Abram Sander, MD sent at 05/06/2015  9:36 AM EDT ----- Please let patient know that her blood work shows no preeclampsia. She is somewhat anemic which is very common in pregnancy. I am prescribing an iron supplement which she should take with orange juice. Thanks!

## 2015-05-07 NOTE — Progress Notes (Addendum)
Crystal [name] is a 29 y.o. G3P1011 at [redacted]w[redacted]d for routine follow up.  She reports pelvic pressure and hip stiffness. See flow sheet for details.  A/P: Pregnancy at [redacted]w[redacted]d.  Doing well.   Pregnancy issues include LGA, borderline BP  Infant feeding choice breast Contraception choice undecided Infant circumcision desired not applicable  Tdapwas not given today. GBS/GC/CZ testing was not performed today.  Preterm labor precautions reviewed. Safe sleep discussed. Kick counts reviewed. Given borderline BP, 30 protein on dipstick UA and persistent LE edema, will check cbc, cmp and UPC today. Follow up 1 weeks.

## 2015-05-11 ENCOUNTER — Ambulatory Visit (INDEPENDENT_AMBULATORY_CARE_PROVIDER_SITE_OTHER): Payer: 59 | Admitting: Family Medicine

## 2015-05-11 VITALS — BP 128/74 | HR 89 | Temp 98.2°F | Wt 311.1 lb

## 2015-05-11 DIAGNOSIS — O3421 Maternal care for scar from previous cesarean delivery: Secondary | ICD-10-CM

## 2015-05-11 DIAGNOSIS — O34219 Maternal care for unspecified type scar from previous cesarean delivery: Secondary | ICD-10-CM

## 2015-05-11 DIAGNOSIS — O3663X1 Maternal care for excessive fetal growth, third trimester, fetus 1: Secondary | ICD-10-CM

## 2015-05-11 DIAGNOSIS — Z3483 Encounter for supervision of other normal pregnancy, third trimester: Secondary | ICD-10-CM

## 2015-05-11 NOTE — Progress Notes (Signed)
Crystal [name] is a 29 y.o. G3P1011 at [redacted]w[redacted]d for routine follow up.  She reports pelvic pressure and pain on standing, small discharge.  See flow sheet for details.  A/P: Pregnancy at [redacted]w[redacted]d.  Doing well.   Pregnancy issues include macrosomia  Infant feeding choicebreast Contraception choicemirena Infant circumcision desired not applicable  Tdapwas not given today. GBS/GC/CZ testing was not performed today.  Preterm labor precautions reviewed. Safe sleep discussed.  Kick counts reviewed. Follow up 1 weeks. Repeat c-section scheduled next week, 9/9

## 2015-05-11 NOTE — Patient Instructions (Signed)
Cesarean Delivery  Cesarean delivery is the birth of a baby through a cut (incision) in the abdomen and womb (uterus).  LET YOUR HEALTH CARE PROVIDER KNOW ABOUT:  All medicines you are taking, including vitamins, herbs, eye drops, creams, and over-the-counter medicines.  Previous problems you or members of your family have had with the use of anesthetics.  Any blood disorders you have.  Previous surgeries you have had.  Medical conditions you have.  Any allergies you have.  Complicationsinvolving the pregnancy. RISKS AND COMPLICATIONS  Generally, this is a safe procedure. However, as with any procedure, complications can occur. Possible complications include:  Bleeding.  Infection.  Blood clots.  Injury to surrounding organs.  Problems with anesthesia.  Injury to the baby. BEFORE THE PROCEDURE   You may be given an antacid medicine to drink. This will prevent acid contents in your stomach from going into your lungs if you vomit during the surgery.  You may be given an antibiotic medicine to prevent infection. PROCEDURE   Hair may be removed from your pubic area and your lower abdomen. This is to prevent infection in the incision site.  A tube (Foley catheter) will be placed in your bladder to drain your urine from your bladder into a bag. This keeps your bladder empty during surgery.  An IV tube will be placed in your vein.  You may be given medicine to numb the lower half of your body (regional anesthetic). If you were in labor, you may have already had an epidural in place which can be used in both labor and cesarean delivery. You may possibly be given medicine to make you sleep (general anesthetic) though this is not as common.  An incision will be made in your abdomen that extends to your uterus. There are 2 basic kinds of incisions:  The horizontal (transverse) incision. Horizontal incisions are from side to side and are used for most routine cesarean  deliveries.  The vertical incision. The vertical incision is from the top of the abdomen to the bottom and is less commonly used. It is often done for women who have a serious complication (extreme prematurity) or under emergency situations.  The horizontal and vertical incisions may both be used at the same time. However, this is very uncommon.  An incision is then made in your uterus to deliver the baby.  Your baby will then be delivered.  Both incisions are then closed with absorbable stitches. AFTER THE PROCEDURE   If you were awake during the surgery, you will see your baby right away. If you were asleep, you will see your baby as soon as you are awake.  You may breastfeed your baby after surgery.  You may be able to get up and walk the same day as the surgery. If you need to stay in bed for a period of time, you will receive help to turn, cough, and take deep breaths after surgery. This helps prevent lung problems such as pneumonia.  Do not get out of bed alone the first time after surgery. You will need help getting out of bed until you are able to do this by yourself.  You may be able to shower the day after your cesarean delivery. After the bandage (dressing) is taken off the incision site, a nurse will assist you to shower if you would like help.  You will have pneumatic compression hose placed on your lower legs. This is done to prevent blood clots. When you are up   and walking regularly, they will no longer be necessary.  Do not cross your legs when you sit.  Save any blood clots that you pass. If you pass a clot while on the toilet, do not flush it. Call for the nurse. Tell the nurse if you think you are bleeding too much or passing too many clots.  You will be given medicine as needed. Let your health care providers know if you are hurting. You may also be given an antibiotic to prevent an infection.  Your IV tube will be taken out when you are drinking a reasonable  amount of fluids. The Foley catheter is taken out when you are up and walking.  If your blood type is Rh negative and your baby's blood type is Rh positive, you will be given a shot of anti-D immune globulin. This shot prevents you from having Rh problems with a future pregnancy. You should get the shot even if you had your tubes tied (tubal ligation).  If you are allowed to take the baby for a walk, place the baby in the bassinet and push it. Do not carry your baby in your arms. Document Released: 08/28/2005 Document Revised: 06/18/2013 Document Reviewed: 03/19/2013 ExitCare Patient Information 2015 [employer]. This information is not intended to replace advice given to you by your health care provider. Make sure you discuss any questions you have with your health care provider.  

## 2015-05-20 ENCOUNTER — Encounter: Payer: 59 | Admitting: Family Medicine

## 2015-05-20 ENCOUNTER — Encounter (HOSPITAL_COMMUNITY): Payer: Self-pay

## 2015-05-20 ENCOUNTER — Encounter (HOSPITAL_COMMUNITY)
Admission: RE | Admit: 2015-05-20 | Discharge: 2015-05-20 | Disposition: A | Discharge status: Home / Self Care | Payer: 59 | Source: Ambulatory Visit | Attending: Obstetrics & Gynecology | Admitting: Obstetrics & Gynecology

## 2015-05-20 ENCOUNTER — Encounter (HOSPITAL_COMMUNITY): Payer: Self-pay | Admitting: Anesthesiology

## 2015-05-20 HISTORY — DX: Anemia, unspecified: D64.9

## 2015-05-20 LAB — CBC
HCT: 30.7 % — ABNORMAL LOW (ref 36.0–46.0)
Hemoglobin: 9.6 g/dL — ABNORMAL LOW (ref 12.0–15.0)
MCH: 23.4 pg — ABNORMAL LOW (ref 26.0–34.0)
MCHC: 31.3 g/dL (ref 30.0–36.0)
MCV: 74.9 fL — ABNORMAL LOW (ref 78.0–100.0)
Platelets: 231 10*3/uL (ref 150–400)
RBC: 4.1 MIL/uL (ref 3.87–5.11)
RDW: 16.3 % — ABNORMAL HIGH (ref 11.5–15.5)
WBC: 9.4 10*3/uL (ref 4.0–10.5)

## 2015-05-20 MED ORDER — DEXTROSE 5 % IV SOLN
2.0000 g | INTRAVENOUS | Status: AC
  Administered 2015-05-21: 2 g via INTRAVENOUS
  Filled 2015-05-20: qty 2

## 2015-05-20 NOTE — Anesthesia Preprocedure Evaluation (Addendum)
Anesthesia Evaluation  Patient identified by MRN, date of birth, ID band Patient awake    Reviewed: Allergy & Precautions, NPO status , Patient's Chart, lab work & pertinent test results  Airway Mallampati: III  TM Distance: >3 FB Neck ROM: Full    Dental no notable dental hx. (+) Teeth Intact   Pulmonary neg pulmonary ROS, former smoker,    Pulmonary exam normal breath sounds clear to auscultation       Cardiovascular hypertension, Normal cardiovascular exam Rhythm:Regular Rate:Normal     Neuro/Psych  Headaches, negative neurological ROS  negative psych ROS   GI/Hepatic negative GI ROS, Neg liver ROS,   Endo/Other  Morbid obesityPCOS  Renal/GU negative Renal ROS Bladder dysfunction   negative genitourinary   Musculoskeletal negative musculoskeletal ROS (+)   Abdominal (+) + obese,   Peds  Hematology  (+) anemia ,   Anesthesia Other Findings   Reproductive/Obstetrics (+) Pregnancy Previous C/Section Suspected fetal macrosomia                             Anesthesia Physical Anesthesia Plan  ASA: III  Anesthesia Plan: Spinal   Post-op Pain Management:    Induction:   Airway Management Planned: Natural Airway  Additional Equipment:   Intra-op Plan:   Post-operative Plan:   Informed Consent: I have reviewed the patients History and Physical, chart, labs and discussed the procedure including the risks, benefits and alternatives for the proposed anesthesia with the patient or authorized representative who has indicated his/her understanding and acceptance.     Plan Discussed with: Anesthesiologist, Surgeon and CRNA  Anesthesia Plan Comments:        Anesthesia Quick Evaluation

## 2015-05-20 NOTE — Patient Instructions (Signed)
Your procedure is scheduled on:05/21/15  Enter through the Main Entrance at :7:45 am Pick up desk phone and dial [phone] and inform us of your arrival.  Please call [phone] if you have any problems the morning of surgery.  Remember: Do not eat food or drink liquids, after midnight: tonight Water is ok until 5 am on Friday  You may brush your teeth the morning of surgery.   DO NOT wear jewelry, eye make-up, lipstick,body lotion, or dark fingernail polish.  (Polished toes are ok) You may wear deodorant.  If you are to be admitted after surgery, leave suitcase in car until your room has been assigned.  Wear loose fitting, comfortable clothes for your ride home.

## 2015-05-21 ENCOUNTER — Encounter (HOSPITAL_COMMUNITY): Payer: Self-pay

## 2015-05-21 ENCOUNTER — Inpatient Hospital Stay (HOSPITAL_COMMUNITY)
Admission: RE | Admit: 2015-05-21 | Discharge: 2015-05-23 | DRG: 765 | Disposition: A | Discharge status: Home / Self Care | Payer: 59 | Source: Ambulatory Visit | Attending: Obstetrics & Gynecology | Admitting: Obstetrics & Gynecology

## 2015-05-21 ENCOUNTER — Encounter (HOSPITAL_COMMUNITY)
Admission: RE | Disposition: A | Discharge status: Home / Self Care | Payer: Self-pay | Source: Ambulatory Visit | Attending: Obstetrics & Gynecology

## 2015-05-21 ENCOUNTER — Inpatient Hospital Stay (HOSPITAL_COMMUNITY): Payer: 59 | Admitting: Anesthesiology

## 2015-05-21 DIAGNOSIS — Z87891 Personal history of nicotine dependence: Secondary | ICD-10-CM | POA: Diagnosis not present

## 2015-05-21 DIAGNOSIS — Z8249 Family history of ischemic heart disease and other diseases of the circulatory system: Secondary | ICD-10-CM

## 2015-05-21 DIAGNOSIS — Z3490 Encounter for supervision of normal pregnancy, unspecified, unspecified trimester: Secondary | ICD-10-CM

## 2015-05-21 DIAGNOSIS — O99013 Anemia complicating pregnancy, third trimester: Secondary | ICD-10-CM | POA: Diagnosis present

## 2015-05-21 DIAGNOSIS — O9902 Anemia complicating childbirth: Secondary | ICD-10-CM | POA: Diagnosis present

## 2015-05-21 DIAGNOSIS — Z3A39 39 weeks gestation of pregnancy: Secondary | ICD-10-CM | POA: Diagnosis present

## 2015-05-21 DIAGNOSIS — Z6841 Body Mass Index (BMI) 40.0 and over, adult: Secondary | ICD-10-CM

## 2015-05-21 DIAGNOSIS — O99210 Obesity complicating pregnancy, unspecified trimester: Secondary | ICD-10-CM | POA: Diagnosis present

## 2015-05-21 DIAGNOSIS — O133 Gestational [pregnancy-induced] hypertension without significant proteinuria, third trimester: Secondary | ICD-10-CM | POA: Diagnosis present

## 2015-05-21 DIAGNOSIS — O99214 Obesity complicating childbirth: Secondary | ICD-10-CM | POA: Diagnosis present

## 2015-05-21 DIAGNOSIS — Z833 Family history of diabetes mellitus: Secondary | ICD-10-CM

## 2015-05-21 DIAGNOSIS — D649 Anemia, unspecified: Secondary | ICD-10-CM | POA: Diagnosis present

## 2015-05-21 DIAGNOSIS — Z98891 History of uterine scar from previous surgery: Secondary | ICD-10-CM

## 2015-05-21 DIAGNOSIS — D509 Iron deficiency anemia, unspecified: Secondary | ICD-10-CM | POA: Diagnosis present

## 2015-05-21 DIAGNOSIS — O3663X0 Maternal care for excessive fetal growth, third trimester, not applicable or unspecified: Secondary | ICD-10-CM | POA: Diagnosis present

## 2015-05-21 DIAGNOSIS — O34219 Maternal care for unspecified type scar from previous cesarean delivery: Secondary | ICD-10-CM | POA: Diagnosis present

## 2015-05-21 DIAGNOSIS — O99019 Anemia complicating pregnancy, unspecified trimester: Secondary | ICD-10-CM | POA: Diagnosis present

## 2015-05-21 DIAGNOSIS — Z3480 Encounter for supervision of other normal pregnancy, unspecified trimester: Secondary | ICD-10-CM

## 2015-05-21 DIAGNOSIS — O3421 Maternal care for scar from previous cesarean delivery: Principal | ICD-10-CM | POA: Diagnosis present

## 2015-05-21 DIAGNOSIS — O139 Gestational [pregnancy-induced] hypertension without significant proteinuria, unspecified trimester: Secondary | ICD-10-CM | POA: Diagnosis present

## 2015-05-21 DIAGNOSIS — O3660X0 Maternal care for excessive fetal growth, unspecified trimester, not applicable or unspecified: Secondary | ICD-10-CM | POA: Diagnosis present

## 2015-05-21 LAB — PREPARE RBC (CROSSMATCH)

## 2015-05-21 LAB — RPR: RPR Ser Ql: NONREACTIVE

## 2015-05-21 SURGERY — Surgical Case
Anesthesia: Spinal | Site: Abdomen

## 2015-05-21 MED ORDER — KETOROLAC TROMETHAMINE 30 MG/ML IJ SOLN: 30.0000 mg | Freq: Four times a day (QID) | INTRAMUSCULAR | Status: DC | PRN

## 2015-05-21 MED ORDER — NALBUPHINE HCL 10 MG/ML IJ SOLN
5.0000 mg | Freq: Once | INTRAMUSCULAR | Status: DC | PRN
  Filled 2015-05-21: qty 0.5

## 2015-05-21 MED ORDER — OXYTOCIN 40 UNITS IN LACTATED RINGERS INFUSION - SIMPLE MED
62.5000 mL/h | INTRAVENOUS | Status: AC
  Filled 2015-05-21: qty 1000

## 2015-05-21 MED ORDER — SIMETHICONE 80 MG PO CHEW
80.0000 mg | CHEWABLE_TABLET | ORAL | Status: DC | PRN
  Administered 2015-05-22: 80 mg via ORAL

## 2015-05-21 MED ORDER — NALBUPHINE HCL 10 MG/ML IJ SOLN
5.0000 mg | INTRAMUSCULAR | Status: DC | PRN
  Administered 2015-05-21: 5 mg via INTRAVENOUS
  Filled 2015-05-21 (×3): qty 0.5

## 2015-05-21 MED ORDER — SENNOSIDES-DOCUSATE SODIUM 8.6-50 MG PO TABS
2.0000 | ORAL_TABLET | ORAL | Status: DC
  Administered 2015-05-21 – 2015-05-23 (×2): 2 via ORAL
  Filled 2015-05-21 (×2): qty 2

## 2015-05-21 MED ORDER — MEASLES, MUMPS & RUBELLA VAC ~~LOC~~ INJ
0.5000 mL | INJECTION | Freq: Once | SUBCUTANEOUS | Status: DC
Start: 2015-05-22 — End: 2015-05-23

## 2015-05-21 MED ORDER — LACTATED RINGERS IV SOLN: INTRAVENOUS | Status: DC

## 2015-05-21 MED ORDER — FENTANYL CITRATE (PF) 100 MCG/2ML IJ SOLN
INTRAMUSCULAR | Status: DC | PRN
  Administered 2015-05-21: 25 ug via INTRATHECAL

## 2015-05-21 MED ORDER — DIPHENHYDRAMINE HCL 25 MG PO CAPS: 25.0000 mg | ORAL_CAPSULE | Freq: Four times a day (QID) | ORAL | Status: DC | PRN

## 2015-05-21 MED ORDER — DIBUCAINE 1 % RE OINT: 1.0000 "application " | TOPICAL_OINTMENT | RECTAL | Status: DC | PRN

## 2015-05-21 MED ORDER — LACTATED RINGERS IV SOLN
INTRAVENOUS | Status: DC
  Administered 2015-05-21 (×4): via INTRAVENOUS

## 2015-05-21 MED ORDER — FENTANYL CITRATE (PF) 100 MCG/2ML IJ SOLN
25.0000 ug | INTRAMUSCULAR | Status: DC | PRN
  Administered 2015-05-21: 50 ug via INTRAVENOUS
  Administered 2015-05-21: 25 ug via INTRAVENOUS

## 2015-05-21 MED ORDER — DIPHENHYDRAMINE HCL 25 MG PO CAPS
25.0000 mg | ORAL_CAPSULE | ORAL | Status: DC | PRN
Start: 2015-05-21 — End: 2015-05-23
  Administered 2015-05-21: 25 mg via ORAL
  Filled 2015-05-21: qty 1

## 2015-05-21 MED ORDER — IBUPROFEN 600 MG PO TABS
600.0000 mg | ORAL_TABLET | Freq: Four times a day (QID) | ORAL | Status: DC
  Administered 2015-05-21 – 2015-05-23 (×8): 600 mg via ORAL
  Filled 2015-05-21 (×8): qty 1

## 2015-05-21 MED ORDER — LACTATED RINGERS IV SOLN: Freq: Once | INTRAVENOUS | Status: DC

## 2015-05-21 MED ORDER — FENTANYL CITRATE (PF) 100 MCG/2ML IJ SOLN
INTRAMUSCULAR | Status: AC
  Administered 2015-05-21: 25 ug via INTRAVENOUS
  Filled 2015-05-21: qty 2

## 2015-05-21 MED ORDER — MORPHINE SULFATE 0.5 MG/ML IJ SOLN
INTRAMUSCULAR | Status: AC
  Filled 2015-05-21: qty 100

## 2015-05-21 MED ORDER — SCOPOLAMINE 1 MG/3DAYS TD PT72
MEDICATED_PATCH | TRANSDERMAL | Status: AC
  Administered 2015-05-21: 1.5 mg via TRANSDERMAL
  Filled 2015-05-21: qty 1

## 2015-05-21 MED ORDER — SCOPOLAMINE 1 MG/3DAYS TD PT72
1.0000 | MEDICATED_PATCH | Freq: Once | TRANSDERMAL | Status: DC
  Administered 2015-05-21: 1.5 mg via TRANSDERMAL

## 2015-05-21 MED ORDER — MENTHOL 3 MG MT LOZG: 1.0000 | LOZENGE | OROMUCOSAL | Status: DC | PRN

## 2015-05-21 MED ORDER — PHENYLEPHRINE 8 MG IN D5W 100 ML (0.08MG/ML) PREMIX OPTIME
INJECTION | INTRAVENOUS | Status: AC
  Filled 2015-05-21: qty 100

## 2015-05-21 MED ORDER — MEPERIDINE HCL 25 MG/ML IJ SOLN: 6.2500 mg | INTRAMUSCULAR | Status: DC | PRN

## 2015-05-21 MED ORDER — BUPIVACAINE HCL (PF) 0.5 % IJ SOLN
INTRAMUSCULAR | Status: DC | PRN
  Administered 2015-05-21: 30 mL

## 2015-05-21 MED ORDER — ZOLPIDEM TARTRATE 5 MG PO TABS: 5.0000 mg | ORAL_TABLET | Freq: Every evening | ORAL | Status: DC | PRN

## 2015-05-21 MED ORDER — FENTANYL CITRATE (PF) 100 MCG/2ML IJ SOLN
INTRAMUSCULAR | Status: AC
  Filled 2015-05-21: qty 4

## 2015-05-21 MED ORDER — TETANUS-DIPHTH-ACELL PERTUSSIS 5-2.5-18.5 LF-MCG/0.5 IM SUSP: 0.5000 mL | Freq: Once | INTRAMUSCULAR | Status: DC

## 2015-05-21 MED ORDER — METHYLERGONOVINE MALEATE 0.2 MG/ML IJ SOLN
INTRAMUSCULAR | Status: AC
  Filled 2015-05-21: qty 1

## 2015-05-21 MED ORDER — SIMETHICONE 80 MG PO CHEW
80.0000 mg | CHEWABLE_TABLET | ORAL | Status: DC
  Administered 2015-05-21 – 2015-05-23 (×2): 80 mg via ORAL
  Filled 2015-05-21 (×2): qty 1

## 2015-05-21 MED ORDER — OXYTOCIN 10 UNIT/ML IJ SOLN
INTRAMUSCULAR | Status: AC
  Filled 2015-05-21: qty 4

## 2015-05-21 MED ORDER — DEXTROSE 5 % IV SOLN
1.0000 ug/kg/h | INTRAVENOUS | Status: DC | PRN
  Filled 2015-05-21: qty 2

## 2015-05-21 MED ORDER — MISOPROSTOL 200 MCG PO TABS
800.0000 ug | ORAL_TABLET | Freq: Once | ORAL | Status: AC
  Administered 2015-05-21: 800 ug via ORAL
  Filled 2015-05-21: qty 4

## 2015-05-21 MED ORDER — FERROUS SULFATE 325 (65 FE) MG PO TABS
325.0000 mg | ORAL_TABLET | Freq: Three times a day (TID) | ORAL | Status: DC
  Administered 2015-05-21 – 2015-05-23 (×5): 325 mg via ORAL
  Filled 2015-05-21 (×5): qty 1

## 2015-05-21 MED ORDER — INFLUENZA VAC SPLIT QUAD 0.5 ML IM SUSY
0.5000 mL | PREFILLED_SYRINGE | INTRAMUSCULAR | Status: AC
  Administered 2015-05-22: 0.5 mL via INTRAMUSCULAR
  Filled 2015-05-21: qty 0.5

## 2015-05-21 MED ORDER — ERYTHROMYCIN 5 MG/GM OP OINT
TOPICAL_OINTMENT | OPHTHALMIC | Status: AC
  Filled 2015-05-21: qty 1

## 2015-05-21 MED ORDER — BUPIVACAINE IN DEXTROSE 0.75-8.25 % IT SOLN
INTRATHECAL | Status: DC | PRN
  Administered 2015-05-21: 12 mg via INTRATHECAL

## 2015-05-21 MED ORDER — DOCUSATE SODIUM 100 MG PO CAPS
100.0000 mg | ORAL_CAPSULE | Freq: Two times a day (BID) | ORAL | Status: DC
  Administered 2015-05-21 – 2015-05-23 (×4): 100 mg via ORAL
  Filled 2015-05-21 (×3): qty 1

## 2015-05-21 MED ORDER — WITCH HAZEL-GLYCERIN EX PADS: 1.0000 "application " | MEDICATED_PAD | CUTANEOUS | Status: DC | PRN

## 2015-05-21 MED ORDER — OXYCODONE-ACETAMINOPHEN 5-325 MG PO TABS: 2.0000 | ORAL_TABLET | ORAL | Status: DC | PRN

## 2015-05-21 MED ORDER — METHYLERGONOVINE MALEATE 0.2 MG/ML IJ SOLN
0.2000 mg | Freq: Once | INTRAMUSCULAR | Status: AC
  Administered 2015-05-21: 0.2 mg via INTRAMUSCULAR

## 2015-05-21 MED ORDER — BUPIVACAINE HCL (PF) 0.5 % IJ SOLN
INTRAMUSCULAR | Status: AC
  Filled 2015-05-21: qty 30

## 2015-05-21 MED ORDER — MORPHINE SULFATE (PF) 0.5 MG/ML IJ SOLN
INTRAMUSCULAR | Status: DC | PRN
  Administered 2015-05-21: .15 mg via EPIDURAL

## 2015-05-21 MED ORDER — PRENATAL MULTIVITAMIN CH
1.0000 | ORAL_TABLET | Freq: Every day | ORAL | Status: DC
  Administered 2015-05-22 – 2015-05-23 (×2): 1 via ORAL
  Filled 2015-05-21 (×2): qty 1

## 2015-05-21 MED ORDER — OXYCODONE-ACETAMINOPHEN 5-325 MG PO TABS
1.0000 | ORAL_TABLET | ORAL | Status: DC | PRN
  Administered 2015-05-21 – 2015-05-22 (×4): 1 via ORAL
  Filled 2015-05-21 (×4): qty 1

## 2015-05-21 MED ORDER — LACTATED RINGERS IV SOLN
INTRAVENOUS | Status: DC
  Administered 2015-05-21 (×2): via INTRAVENOUS

## 2015-05-21 MED ORDER — ONDANSETRON HCL 4 MG/2ML IJ SOLN
INTRAMUSCULAR | Status: DC | PRN
  Administered 2015-05-21: 4 mg via INTRAVENOUS

## 2015-05-21 MED ORDER — PHENYLEPHRINE 8 MG IN D5W 100 ML (0.08MG/ML) PREMIX OPTIME
INJECTION | INTRAVENOUS | Status: DC | PRN
  Administered 2015-05-21: 60 ug/min via INTRAVENOUS

## 2015-05-21 MED ORDER — OXYTOCIN 40 UNITS IN LACTATED RINGERS INFUSION - SIMPLE MED
250.0000 mL/h | INTRAVENOUS | Status: DC
  Administered 2015-05-21: 250 mL/h via INTRAVENOUS

## 2015-05-21 MED ORDER — OXYTOCIN 10 UNIT/ML IJ SOLN
40.0000 [IU] | INTRAVENOUS | Status: DC | PRN
  Administered 2015-05-21: 40 [IU] via INTRAVENOUS

## 2015-05-21 MED ORDER — ACETAMINOPHEN 325 MG PO TABS: 650.0000 mg | ORAL_TABLET | ORAL | Status: DC | PRN

## 2015-05-21 MED ORDER — IBUPROFEN 600 MG PO TABS: 600.0000 mg | ORAL_TABLET | Freq: Four times a day (QID) | ORAL | Status: DC | PRN

## 2015-05-21 MED ORDER — DIPHENHYDRAMINE HCL 50 MG/ML IJ SOLN: 12.5000 mg | INTRAMUSCULAR | Status: DC | PRN

## 2015-05-21 MED ORDER — METHYLERGONOVINE MALEATE 0.2 MG PO TABS
0.2000 mg | ORAL_TABLET | Freq: Four times a day (QID) | ORAL | Status: AC
  Administered 2015-05-22 – 2015-05-23 (×6): 0.2 mg via ORAL
  Filled 2015-05-21 (×6): qty 1

## 2015-05-21 MED ORDER — ONDANSETRON HCL 4 MG/2ML IJ SOLN: 4.0000 mg | Freq: Three times a day (TID) | INTRAMUSCULAR | Status: DC | PRN

## 2015-05-21 MED ORDER — IBUPROFEN 600 MG PO TABS: 600.0000 mg | ORAL_TABLET | Freq: Four times a day (QID) | ORAL | Status: DC

## 2015-05-21 MED ORDER — NALBUPHINE HCL 10 MG/ML IJ SOLN
5.0000 mg | INTRAMUSCULAR | Status: DC | PRN
  Filled 2015-05-21: qty 0.5

## 2015-05-21 MED ORDER — MAGNESIUM HYDROXIDE 400 MG/5ML PO SUSP: 30.0000 mL | ORAL | Status: DC | PRN

## 2015-05-21 MED ORDER — 0.9 % SODIUM CHLORIDE (POUR BTL) OPTIME
TOPICAL | Status: DC | PRN
  Administered 2015-05-21: 1000 mL

## 2015-05-21 MED ORDER — ONDANSETRON HCL 4 MG/2ML IJ SOLN
INTRAMUSCULAR | Status: AC
  Filled 2015-05-21: qty 2

## 2015-05-21 MED ORDER — VITAMIN K1 1 MG/0.5ML IJ SOLN
INTRAMUSCULAR | Status: AC
  Filled 2015-05-21: qty 0.5

## 2015-05-21 MED ORDER — KETOROLAC TROMETHAMINE 30 MG/ML IJ SOLN
30.0000 mg | Freq: Four times a day (QID) | INTRAMUSCULAR | Status: DC | PRN
  Administered 2015-05-21: 30 mg via INTRAMUSCULAR

## 2015-05-21 MED ORDER — NALOXONE HCL 0.4 MG/ML IJ SOLN: 0.4000 mg | INTRAMUSCULAR | Status: DC | PRN

## 2015-05-21 MED ORDER — SODIUM CHLORIDE 0.9 % IJ SOLN: 3.0000 mL | INTRAMUSCULAR | Status: DC | PRN

## 2015-05-21 MED ORDER — LANOLIN HYDROUS EX OINT: 1.0000 "application " | TOPICAL_OINTMENT | CUTANEOUS | Status: DC | PRN

## 2015-05-21 MED ORDER — KETOROLAC TROMETHAMINE 30 MG/ML IJ SOLN
INTRAMUSCULAR | Status: AC
  Administered 2015-05-21: 30 mg via INTRAMUSCULAR
  Filled 2015-05-21: qty 1

## 2015-05-21 SURGICAL SUPPLY — 35 items
CLAMP CORD UMBIL (MISCELLANEOUS) IMPLANT
CLOTH BEACON ORANGE TIMEOUT ST (SAFETY) ×2 IMPLANT
DRAPE SHEET LG 3/4 BI-LAMINATE (DRAPES) IMPLANT
DRSG OPSITE POSTOP 4X10 (GAUZE/BANDAGES/DRESSINGS) ×2 IMPLANT
DURAPREP 26ML APPLICATOR (WOUND CARE) ×2 IMPLANT
ELECT REM PT RETURN 9FT ADLT (ELECTROSURGICAL) ×2
ELECTRODE REM PT RTRN 9FT ADLT (ELECTROSURGICAL) ×1 IMPLANT
EXTRACTOR VACUUM M CUP 4 TUBE (SUCTIONS) IMPLANT
GAUZE SPONGE 4X4 12PLY STRL (GAUZE/BANDAGES/DRESSINGS) ×1 IMPLANT
GLOVE BIOGEL PI IND STRL 7.0 (GLOVE) ×2 IMPLANT
GLOVE BIOGEL PI INDICATOR 7.0 (GLOVE) ×2
GLOVE ECLIPSE 7.0 STRL STRAW (GLOVE) ×2 IMPLANT
GOWN STRL REUS W/TWL LRG LVL3 (GOWN DISPOSABLE) ×4 IMPLANT
KIT ABG SYR 3ML LUER SLIP (SYRINGE) ×1 IMPLANT
NDL HYPO 25X5/8 SAFETYGLIDE (NEEDLE) ×1 IMPLANT
NEEDLE HYPO 22GX1.5 SAFETY (NEEDLE) ×2 IMPLANT
NEEDLE HYPO 25X5/8 SAFETYGLIDE (NEEDLE) ×2 IMPLANT
NS IRRIG 1000ML POUR BTL (IV SOLUTION) ×2 IMPLANT
PACK C SECTION WH (CUSTOM PROCEDURE TRAY) ×2 IMPLANT
PAD ABD 7.5X8 STRL (GAUZE/BANDAGES/DRESSINGS) ×2 IMPLANT
PAD ABD 8X7 1/2 STERILE (GAUZE/BANDAGES/DRESSINGS) ×1 IMPLANT
PAD OB MATERNITY 4.3X12.25 (PERSONAL CARE ITEMS) ×2 IMPLANT
PENCIL SMOKE EVAC W/HOLSTER (ELECTROSURGICAL) ×2 IMPLANT
RTRCTR C-SECT PINK 25CM LRG (MISCELLANEOUS) IMPLANT
SUT PDS AB 0 CT1 27 (SUTURE) IMPLANT
SUT PDS AB 0 CTX 36 PDP370T (SUTURE) IMPLANT
SUT PLAIN 2 0 XLH (SUTURE) ×1 IMPLANT
SUT VIC AB 0 CT1 36 (SUTURE) ×4 IMPLANT
SUT VIC AB 0 CTX 36 (SUTURE) ×4
SUT VIC AB 0 CTX36XBRD ANBCTRL (SUTURE) ×2 IMPLANT
SUT VIC AB 4-0 KS 27 (SUTURE) ×2 IMPLANT
SYR CONTROL 10ML LL (SYRINGE) ×2 IMPLANT
TAPE CLOTH SURG 4X10 WHT LF (GAUZE/BANDAGES/DRESSINGS) ×1 IMPLANT
TOWEL OR 17X24 6PK STRL BLUE (TOWEL DISPOSABLE) ×2 IMPLANT
TRAY FOLEY CATH SILVER 14FR (SET/KITS/TRAYS/PACK) ×2 IMPLANT

## 2015-05-21 NOTE — Anesthesia Procedure Notes (Signed)
Spinal Patient location during procedure: OR Start time: 05/21/2015 9:17 AM Staffing Anesthesiologist: Mal Amabile Performed by: anesthesiologist  Preanesthetic Checklist Completed: patient identified, site marked, surgical consent, pre-op evaluation, timeout performed, IV checked, risks and benefits discussed and monitors and equipment checked Spinal Block Patient position: sitting Prep: site prepped and draped and DuraPrep Patient monitoring: cardiac monitor, continuous pulse ox, blood pressure and heart rate Approach: midline Location: L3-4 Injection technique: catheter Needle Needle type: Tuohy and Sprotte  Needle gauge: 24 G Needle length: 12.7 cm Needle insertion depth: 7 cm Catheter type: closed end flexible Catheter size: 19 g Assessment Sensory level: T4 Additional Notes Epidural performed as above. SAB performed through the epidural needle. CSF clear with free flow and no paresthesias. Spinal needle withdrawn and epidural catheter threaded 5cm into the epidural space. Epidural needle withdrawn and sterile dressing applied. Patient placed supine with LUD. She tolerated the procedure well. Adequate sensory level.

## 2015-05-21 NOTE — Transfer of Care (Signed)
Immediate Anesthesia Transfer of Care Note  Patient: Crystal [name]  Procedure(s) Performed: Procedure(s) with comments: CESAREAN SECTION (N/A) - Requested 05-21-15 @ 1:15p  Patient Location: PACU  Anesthesia Type:Spinal  Level of Consciousness: awake  Airway & Oxygen Therapy: Patient Spontanous Breathing  Post-op Assessment: Report given to RN  Post vital signs: Reviewed and stable  Last Vitals:  Filed Vitals:   05/21/15 0810  BP: 141/76  Pulse: 88  Temp: 36.7 C  Resp: 16    Complications: No apparent anesthesia complications

## 2015-05-21 NOTE — Progress Notes (Signed)
Patient ID: Crystal [name], female   DOB: [birth year], 29 y.o.   MRN: [id number] Called to see patient for excessive bleeding postop.  Had 850 cc EBL in OR  Has passed clots (230cc) and had a steady trickle  I ordered Pitocin IV (second bag)  Exam revealed trickle and I expressed 230cc extra clot  Total 460cc total EBL after OR  Will increase Pitocin to 332ml/hr  Will watch.  May need Cytotec

## 2015-05-21 NOTE — Progress Notes (Signed)
Patient was assisted up to bathroom for the first time. Orthostatic blood pressures normal. Patient denied dizziness. When ambulating patient back to bed she stated that she felt "blood running down her legs". Assisted patient back to the bathroom where she passed several small dime sized clots. Assisted her back to bed and she saturated a small pad and passed 3 golf ball sized clots. See vital signs. Hilda Lias, CNM teaching service notified and ordered to continue to monitor at this time.

## 2015-05-21 NOTE — H&P (Signed)
Obstetric Preoperative History and Physical  Crystal [name] is a 29 y.o. G3P1011 with IUP at [redacted]w[redacted]d presenting for presenting for scheduled repeat cesarean section.  No acute concerns.   Prenatal Course  Patient Active Problem List   Diagnosis Date Noted  . Anemia affecting pregnancy in third trimester 05/21/2015  . Gestational hypertension, antepartum 05/21/2015  . Anemia, iron deficiency 05/06/2015  . LGA (large for gestational age) fetus affecting management of mother 04/29/2015  . Previous cesarean delivery affecting pregnancy, antepartum 03/03/2015  . Obesity affecting pregnancy, antepartum   . Supervision of other normal pregnancy 12/09/2014  . Lumbar back pain 09/16/2014  . ALLERGIC RHINITIS 09/30/2010  . POLYCYSTIC OVARIAN DISEASE 09/24/2009   Prenatal Studies Clinic/provider [facility] [phone]) Prenatal Labs  Dating U/s at <12 weeks at [facility] ob[facility] Blood type: B/POS/-- (03/30 1454)   Genetic Screen  Quad:  WNL    Antibody:NEG (03/30 1454)  Anatomic US WNL Rubella: 3.23 (03/30 1454)  GTT Early:       125        Third trimester: 117 RPR: NON REAC (06/22 1523)   Flu vaccine Not yet HBsAg: NEGATIVE (03/30 1454)   TDaP vaccine 03/03/15                                          HIV: NONREACTIVE (06/22 1523)   GBS Negative GBS: NEG  Contraception  Depo Provera Pap:WNL  Baby Food Breast   Circumcision n/a   Pediatrician [facility]   Support Person FOB     Past Medical History  Diagnosis Date  . Allergy   . PCOS (polycystic ovarian syndrome)     No radiological confirmation  . Irregular menses   . Hx of chlamydia infection   . H/O varicella   . History of PID   . Obese   . Status post primary low transverse cesarean section - 12/7 08/17/2012    Presumed macrosomia - FT progress with NRFT   . Postpartum care following cesarean delivery 08/19/2012  . Anemia   . Hypertension 2013    PIH    Past Surgical History  Procedure Laterality Date  . Cesarean  section  08/18/2012    Procedure: CESAREAN SECTION;x 1  Surgeon: Tresa Endo A. Ernestina Penna, MD;  Location: [facility];  Service: Obstetrics;  Laterality: N/A;  Primary Cesarean Section     OB History  Gravida Para Term Preterm AB SAB TAB Ectopic Multiple Living  3 1 1  0 1 1    1     # Outcome Date GA Lbr Len/2nd Weight Sex Delivery Anes PTL Lv  3 Current           2 Term 08/17/12 [redacted]w[redacted]d  9 lb 6.1 oz (4.255 kg) M CS-LTranv EPI  Y  1 SAB 2008             Comments: No D&C      Social History   Social History  . Marital Status: Single    Spouse Name: N/A  . Number of Children: N/A  . Years of Education: N/A   Social History Main Topics  . Smoking status: Former Smoker    Types: Cigarettes  . Smokeless tobacco: Never Used  . Alcohol Use: No  . Drug Use: No  . Sexual Activity: Yes   Other Topics Concern  . None   Social History Narrative  Family History  Problem Relation Age of Onset  . Heart disease Mother   . Hypertension Mother   . Diabetes Mother   . Asthma Mother   . Heart disease Father   . Hypertension Father   . Diabetes Father   . Asthma Father   . Diabetes Maternal Grandmother   . Hypertension Maternal Grandmother   . Cancer Maternal Grandmother     urethral, lung    Prescriptions prior to admission  Medication Sig Dispense Refill Last Dose  . ferrous sulfate 325 (65 FE) MG tablet Take 1 tablet (325 mg total) by mouth daily with breakfast. Take with orange juice and away from prenatal vitamin to avoid constipation. 30 tablet 3   . Prenatal Vit-Fe Fumarate-FA (PRENATAL MULTIVITAMIN) TABS tablet Take 1 tablet by mouth daily at 12 noon.   04/09/2015 at Unknown time    No Known Allergies  Review of Systems: Negative except for what is mentioned in HPI.  Physical Exam: T36.7 C BP 141/76 P88 R16 SpO2 98%    FHR by Doppler: 145 bpm CONSTITUTIONAL: Well-developed, well-nourished female in no acute distress.  HENT:  Normocephalic, atraumatic, External right and left  ear normal. Oropharynx is clear and moist EYES: Conjunctivae and EOM are normal. Pupils are equal, round, and reactive to light. No scleral icterus.  NECK: Normal range of motion, supple, no masses SKIN: Skin is warm and dry. No rash noted. Not diaphoretic. No erythema. No pallor. NEUROLGIC: Alert and oriented to person, place, and time. Normal reflexes, muscle tone coordination. No cranial nerve deficit noted. PSYCHIATRIC: Normal mood and affect. Normal behavior. Normal judgment and thought content. CARDIOVASCULAR: Normal heart rate noted, regular rhythm RESPIRATORY: Effort and breath sounds normal, no problems with respiration noted ABDOMEN: Soft, nontender, nondistended, gravid. ell-healed Pfannenstiel incision. PELVIC: Deferred MUSCULOSKELETAL: Normal range of motion. No edema and no tenderness. 2+ distal pulses.   Pertinent Labs/Studies:   Results for orders placed or performed during the hospital encounter of 05/20/15 (from the past 72 hour(s))  Type and screen     Status: None   Collection Time: 05/20/15 11:00 AM  Result Value Ref Range   ABO/RH(D) B POS    Antibody Screen NEG    Sample Expiration 05/23/2015   CBC     Status: Abnormal   Collection Time: 05/20/15 11:07 AM  Result Value Ref Range   WBC 9.4 4.0 - 10.5 K/uL   RBC 4.10 3.87 - 5.11 MIL/uL   Hemoglobin 9.6 (L) 12.0 - 15.0 g/dL   HCT 16.1 (L) 09.6 - 04.5 %   MCV 74.9 (L) 78.0 - 100.0 fL   MCH 23.4 (L) 26.0 - 34.0 pg   MCHC 31.3 30.0 - 36.0 g/dL   RDW 40.9 (H) 81.1 - 91.4 %   Platelets 231 150 - 400 K/uL  RPR     Status: None   Collection Time: 05/20/15 11:07 AM  Result Value Ref Range   RPR Ser Ql Non Reactive Non Reactive    Comment: (NOTE) Performed At: [facility] [address] [place], [place] [id number] Mila Homer MD YQ:[id number]    Assessment and Plan :Crystal [name] is a 29 y.o. G3P1011 at [redacted]w[redacted]d being admitted being admitted for scheduled repeat sarean section. The risks of cesarean  section discussed with the patient included but were not limited to: bleeding which may require transfusion or reoperation; infection which may require antibiotics; injury to bowel, bladder, ureters or other surrounding organs; injury to the fetus; need for  additional procedures including hysterectomy in the event of a life-threatening hemorrhage; placental abnormalities wth subsequent pregnancies, incisional problems, thromboembolic phenomenon and other postoperative/anesthesia complications. The patient concurred with the proposed plan, giving informed written consent for the procedure. Patient has been NPO since last night she will remain NPO for procedure. Anesthesia and OR aware. Preoperative prophylactic antibiotics and SCDs ordered on call to the OR.  BP stable today, no signs of preeclampsia. To OR when ready.    Jaynie Collins, MD, FACOG  Attending Obstetrician & Gynecologist  Faculty Practice, [facility]

## 2015-05-21 NOTE — Consult Note (Signed)
Neonatology Note:   Attendance at C-section:   I was asked by Dr. Anyanwu to attend this repeat C/S at term. The mother is a G3P1A1 B pos, GBS neg with gestational HTN and known LGA fetus. ROM at delivery, fluid clear. Infant vigorous with good spontaneous cry and tone. Needed only minimal bulb suctioning. Ap 8/9. Lungs clear to ausc in DR. To CN to care of Pediatrician.  Elexius Minar C. Danie Diehl, MD 

## 2015-05-21 NOTE — Op Note (Addendum)
Crystal [name] PROCEDURE DATE: 05/21/2015  PREOPERATIVE DIAGNOSES: Intrauterine pregnancy at [redacted]w[redacted]d weeks gestation; previous cesarean section  POSTOPERATIVE DIAGNOSES: The same  PROCEDURE: Repeat Low Transverse Cesarean Section  SURGEON:  Dr. Jaynie Collins  ASSISTANT:  Dr. Beverely Low PGY-3  ANESTHESIOLOGIST: Dr. Mal Amabile  INDICATIONS: Crystal [name] is a 29 y.o. [id number] at [redacted]w[redacted]d here for repeat cesarean section secondary to the indications listed under preoperative diagnoses; please see preoperative note for further details.  The risks of cesarean section were discussed with the patient including but were not limited to: bleeding which may require transfusion or reoperation; infection which may require antibiotics; injury to bowel, bladder, ureters or other surrounding organs; injury to the fetus; need for additional procedures including hysterectomy in the event of a life-threatening hemorrhage; placental abnormalities wth subsequent pregnancies, incisional problems, thromboembolic phenomenon and other postoperative/anesthesia complications.   The patient concurred with the proposed plan, giving informed written consent for the procedure.    FINDINGS:  Viable female infant in cephalic presentation.  Apgars 8 and 9.  Weight 7 lb 14.8 oz. Clear amniotic fluid.  Intact placenta, three vessel cord.  Normal uterus, fallopian tubes and ovaries bilaterally. Moderate intraperitoneal adhesive disease involving vesicouterine peritoneum and anterior abdominal wall; lysed using blunt and sharp methods.  ANESTHESIA: Combined Spinal-Epidural INTRAVENOUS FLUIDS: 2800 ml ESTIMATED BLOOD LOSS: 800 ml URINE OUTPUT:  125 ml SPECIMENS: Placenta sent to pathology COMPLICATIONS: None immediate  PROCEDURE IN DETAIL:  The patient preoperatively received intravenous antibiotics and had sequential compression devices applied to her lower extremities.  She was then taken to the operating room where combined  spinal-epidural anesthesia was administered and was found to be adequate. She was then placed in a dorsal supine position with a leftward tilt, and prepped and draped in a sterile manner.  A foley catheter was placed into her bladder and attached to constant gravity.  After an adequate timeout was performed, a elliptical incision was made with scalpel to excise her preexisitng scar keloid.  This incision was carried through to the underlying layer of fascia. The fascia was incised in the midline, and this incision was extended bilaterally using the Mayo scissors.  Kocher clamps were applied to the superior aspect of the fascial incision and the underlying rectus muscles were dissected off sharply.  A similar process was carried out on the inferior aspect of the fascial incision. The rectus muscles were separated in the midline bluntly and the peritoneum was entered bluntly. Attention was turned to the lower uterine segment where adhesions were encountered as mentioned above and lysed with blunt and sharp methods.  A low transverse hysterotomy was made with a scalpel and extended bilaterally bluntly.  The infant was successfully delivered, the cord was clamped and cut, and the infant was handed over to the awaiting neonatology team. Uterine massage was then administered, and the placenta delivered intact with a three-vessel cord. The uterus was then cleared of clot and debris.  The hysterotomy was closed with 0 Vicryl in a running locked fashion, and an imbricating layer was also placed with 0 Vicryl.  Figure-of-eight 0 Vicryl serosal stitches were placed to help with hemostasis.  The pelvis was cleared of all clot and debris. Hemostasis was confirmed on all surfaces.  The peritoneum and the muscles were reapproximated using 0 Vicryl interrupted stitches. The fascia was then closed using 0 PDS in a running fashion.  The subcutaneous layer was irrigated, then reapproximated with 2-0 plain gut interrupted stitches, and Crystal [id number]  ml of 0.5% Marcaine was injected subcutaneously around the incision.  The skin was closed with a 4-0 Vicryl subcuticular stitch. The patient tolerated the procedure well. Sponge, lap, instrument and needle counts were correct x 3.  She was taken to the recovery room in stable condition.    Jaynie Collins, MD, FACOG Attending Obstetrician & Gynecologist Faculty Practice, [facility]

## 2015-05-21 NOTE — Anesthesia Postprocedure Evaluation (Signed)
  Anesthesia Post-op Note  Patient: Crystal [name]  Procedure(s) Performed: Procedure(s) with comments: CESAREAN SECTION (N/A) - Requested 05-21-15 @ 1:15p  Patient Location: PACU  Anesthesia Type:Spinal  Level of Consciousness: awake, alert  and oriented  Airway and Oxygen Therapy: Patient Spontanous Breathing  Post-op Pain: none  Post-op Assessment: Post-op Vital signs reviewed, Patient's Cardiovascular Status Stable, Respiratory Function Stable, Patent Airway, No signs of Nausea or vomiting, Pain level controlled, No headache, No backache, Spinal receding and Patient able to bend at knees LLE Motor Response: Non-purposeful movement LLE Sensation: Numbness RLE Motor Response: Purposeful movement RLE Sensation: Numbness L Sensory Level: L5-Outer lower leg, top of foot, great toe R Sensory Level: L5-Outer lower leg, top of foot, great toe  Post-op Vital Signs: Reviewed and stable  Last Vitals:  Filed Vitals:   05/21/15 1235  BP: 119/57  Pulse: 86  Temp: 36.9 C  Resp: 16    Complications: No apparent anesthesia complications

## 2015-05-22 LAB — CBC
HCT: 24.5 % — ABNORMAL LOW (ref 36.0–46.0)
Hemoglobin: 7.6 g/dL — ABNORMAL LOW (ref 12.0–15.0)
MCH: 22.9 pg — ABNORMAL LOW (ref 26.0–34.0)
MCHC: 30.6 g/dL (ref 30.0–36.0)
MCV: 74.7 fL — ABNORMAL LOW (ref 78.0–100.0)
Platelets: 188 10*3/uL (ref 150–400)
RBC: 3.28 MIL/uL — ABNORMAL LOW (ref 3.87–5.11)
RDW: 16.2 % — ABNORMAL HIGH (ref 11.5–15.5)
WBC: 10.7 10*3/uL — ABNORMAL HIGH (ref 4.0–10.5)

## 2015-05-22 LAB — BIRTH TISSUE RECOVERY COLLECTION (PLACENTA DONATION)

## 2015-05-22 NOTE — Addendum Note (Signed)
Addendum  created 05/22/15 0802 by Algis Greenhouse, CRNA   Modules edited: Notes Section   Notes Section:  File: [id number]

## 2015-05-22 NOTE — Progress Notes (Signed)
Post Partum Day 1 Subjective:  Crystal [name] is a 29 y.o. [id number] [redacted]w[redacted]d s/p rLTCS.  No acute events overnight.  Pt denies problems with ambulating, voiding or po intake.  She denies nausea or vomiting.  Pain is well controlled.  She has had flatus. She has not had bowel movement.  Lochia Small.  Plan for birth control is Depo-Provera.  Method of Feeding: breast  Objective: Blood pressure 133/54, pulse 80, temperature 98.6 F (37 C), temperature source Oral, resp. rate 18, last menstrual period 07/21/2014, SpO2 98 %, unknown if currently breastfeeding.  Physical Exam:  General: alert, cooperative and no distress Lochia:normal flow Chest: CTAB Heart: RRR no m/r/g Abdomen: +BS, soft, nontender,  Uterine Fundus: firm Incision: clean/dry/intact DVT Evaluation: No evidence of DVT seen on physical exam. Extremities: no edema  Recent Labs  05/20/15 1107 05/22/15 0540  HGB 9.6* 7.6*  HCT 30.7* 24.5*    Assessment/Plan:  ASSESSMENT: Crystal [name] is a 29 y.o. [id number] [redacted]w[redacted]d s/p rLTCS doing well.  Slight decrease in H/H but not below transfusion threshold and not symptomatic.  Plan for discharge tomorrow, Breastfeeding and Contraception Depo   LOS: 1 day   Federico Flake 05/22/2015, 1:30 PM

## 2015-05-22 NOTE — Lactation Note (Signed)
This note was copied from the chart of Girl Sobia Karger. Lactation Consultation Note Initial visit at 32 hours of age.  Mom attempting to latch baby now.  Mom has large pendulous breast with large diameter nipples.  Baby opens mouth wide and latches well, on and off and needs stimulation to maintain feeding.  Mom denies pain with latch.  Mom reports history of PCOS with little breast changes during pregnancy other than areolas are darker.  Hand pump given with fitting for #30 flange and encouraged to some pumping of stimulation.  Mom does not have DEBP, but has WIC.  [facility] LC resources given and discussed.  Encouraged to feed with early cues on demand.  Early newborn behavior discussed.  Hand expression demonstrated with colostrum visible.  Mom to call for assist as needed.    Patient Name: Girl Illa Enlow [id number] Date: 05/22/2015 Reason for consult: Initial assessment   Maternal Data Has patient been taught Hand Expression?: Yes Does the patient have breastfeeding experience prior to this delivery?: Yes  Feeding Feeding Type: Breast Fed  LATCH Score/Interventions Latch: Repeated attempts needed to sustain latch, nipple held in mouth throughout feeding, stimulation needed to elicit sucking reflex. Intervention(s): Adjust position;Breast massage;Assist with latch;Breast compression  Audible Swallowing: A few with stimulation  Type of Nipple: Everted at rest and after stimulation  Comfort (Breast/Nipple): Soft / non-tender     Hold (Positioning): Assistance needed to correctly position infant at breast and maintain latch. Intervention(s): Breastfeeding basics reviewed;Support Pillows;Position options  LATCH Score: 7  Lactation Tools Discussed/Used     Consult Status Consult Status: Follow-up Date: 05/23/15 Follow-up type: In-patient    Jannifer Rodney 05/22/2015, 6:54 PM

## 2015-05-22 NOTE — Anesthesia Postprocedure Evaluation (Signed)
Anesthesia Post Note  Patient: Crystal [name]  Procedure(s) Performed: Procedure(s) (LRB): CESAREAN SECTION (N/A)  Anesthesia type: Spinal  Patient location: Mother/Baby  Post pain: Pain level controlled  Post assessment: Post-op Vital signs reviewed  Last Vitals:  Filed Vitals:   05/22/15 0333  BP: 124/48  Pulse: 78  Temp: 37.4 C  Resp: 18    Post vital signs: Reviewed  Level of consciousness: awake  Complications: No apparent anesthesia complications

## 2015-05-23 ENCOUNTER — Other Ambulatory Visit: Payer: Self-pay | Admitting: Obstetrics & Gynecology

## 2015-05-23 DIAGNOSIS — Z98891 History of uterine scar from previous surgery: Secondary | ICD-10-CM

## 2015-05-23 MED ORDER — IBUPROFEN 600 MG PO TABS
600.0000 mg | ORAL_TABLET | Freq: Four times a day (QID) | ORAL | Status: DC
Start: 2015-05-23 — End: 2015-07-02

## 2015-05-23 MED ORDER — DOCUSATE SODIUM 100 MG PO CAPS: 100.0000 mg | ORAL_CAPSULE | Freq: Two times a day (BID) | ORAL | Status: DC

## 2015-05-23 MED ORDER — OXYCODONE-ACETAMINOPHEN 5-325 MG PO TABS: 1.0000 | ORAL_TABLET | ORAL | Status: DC | PRN

## 2015-05-23 NOTE — Discharge Summary (Signed)
Obstetric Discharge Summary Reason for Admission: cesarean section Prenatal Procedures: NST Intrapartum Procedures: cesarean: low cervical, transverse Postpartum Procedures: none Complications-Operative and Postpartum: none HEMOGLOBIN  Date Value Ref Range Status  05/22/2015 7.6* 12.0 - 15.0 g/dL Final    Comment:    DELTA CHECK NOTED REPEATED TO VERIFY    HCT  Date Value Ref Range Status  05/22/2015 24.5* 36.0 - 46.0 % Final   FINDINGS: Viable female infant in cephalic presentation. Apgars 8 and 9. Weight 7 lb 14.8 oz. Clear amniotic fluid. Intact placenta, three vessel cord. Normal uterus, fallopian tubes and ovaries bilaterally. Moderate intraperitoneal adhesive disease involving vesicouterine peritoneum and anterior abdominal wall; lysed using blunt and sharp methods.  ANESTHESIA: Combined Spinal-Epidural INTRAVENOUS FLUIDS: 2800 ml ESTIMATED BLOOD LOSS: 800 ml URINE OUTPUT: 125 ml SPECIMENS: Placenta sent to pathology COMPLICATIONS: None immediate  PROCEDURE IN DETAIL: The patient preoperatively received intravenous antibiotics and had sequential compression devices applied to her lower extremities. She was then taken to the operating room where combined spinal-epidural anesthesia was administered and was found to be adequate. She was then placed in a dorsal supine position with a leftward tilt, and prepped and draped in a sterile manner. A foley catheter was placed into her bladder and attached to constant gravity. After an adequate timeout was performed, a elliptical incision was made with scalpel to excise her preexisitng scar keloid. This incision was carried through to the underlying layer of fascia. The fascia was incised in the midline, and this incision was extended bilaterally using the Mayo scissors. Kocher clamps were applied to the superior aspect of the fascial incision and the underlying rectus muscles were dissected off sharply. A similar process was  carried out on the inferior aspect of the fascial incision. The rectus muscles were separated in the midline bluntly and the peritoneum was entered bluntly. Attention was turned to the lower uterine segment where adhesions were encountered as mentioned above and lysed with blunt and sharp methods. A low transverse hysterotomy was made with a scalpel and extended bilaterally bluntly. The infant was successfully delivered, the cord was clamped and cut, and the infant was handed over to the awaiting neonatology team. Uterine massage was then administered, and the placenta delivered intact with a three-vessel cord. The uterus was then cleared of clot and debris. The hysterotomy was closed with 0 Vicryl in a running locked fashion, and an imbricating layer was also placed with 0 Vicryl. Figure-of-eight 0 Vicryl serosal stitches were placed to help with hemostasis. The pelvis was cleared of all clot and debris. Hemostasis was confirmed on all surfaces. The peritoneum and the muscles were reapproximated using 0 Vicryl interrupted stitches. The fascia was then closed using 0 PDS in a running fashion. The subcutaneous layer was irrigated, then reapproximated with 2-0 plain gut interrupted stitches, and 30 ml of 0.5% Marcaine was injected subcutaneously around the incision. The skin was closed with a 4-0 Vicryl subcuticular stitch. The patient tolerated the procedure well. Sponge, lap, instrument and needle counts were correct x 3. She was taken to the recovery room in stable condition.   Patient had an uncomplicated postpartum course. She was tolerating eating and ambulation well. She denied dizziness and lightheadedness on the day of discharge.  Pain was well controlled at the time of discharged.   Physical Exam:  BP 123/70 mmHg  Pulse 70  Temp(Src) 98.1 F (36.7 C) (Oral)  Resp 18  SpO2 98%  LMP 07/21/2014  Breastfeeding? Unknown General: alert, cooperative and appears  stated age Lochia:  appropriate Uterine Fundus: firm Incision: healing well, no significant drainage, no dehiscence, no significant erythema DVT Evaluation: No evidence of DVT seen on physical exam.  Discharge Diagnoses: Term Pregnancy-delivered  Discharge Information: Date: 05/23/2015 Activity: pelvic rest Diet: routine Medications: PNV, Ibuprofen, Colace, Iron and Percocet Condition: stable Instructions: refer to practice specific booklet Discharge to: home Follow-up Information    Follow up with Beverely Low, MD On 06/21/2015.   Specialty:  Family Medicine   Why:  2:30pm appointment   Contact information:   [address] [place] [address] [phone]       Follow up with Beverely Low, MD. Schedule an appointment as soon as possible for a visit in 5 weeks.   Specialty:  Family Medicine   Why:  postpartum care and start contraception   Contact information:   [address] [place] [address] [phone]       Newborn Data: Live born female  Birth Weight: 7 lb 14.8 oz (3595 g) APGAR: 8, 9  Home with mother.  Isa Rankin [facility] 05/23/2015, 6:07 AM

## 2015-05-23 NOTE — Discharge Instructions (Signed)

## 2015-05-23 NOTE — Lactation Note (Signed)
This note was copied from the chart of Crystal [name]. Lactation Consultation Note Follow up visit made prior to discharge.  Mom has given formula bottles throughout night and this AM.  She states she desires to both breast and formula feed.  Discussed establishing and maintaining milk supply.  Stressed importance of good breast emptying by baby or pump every 3 hours.  Lactation support and services reviewed and encouraged.  Patient Name: Crystal [name] [id number] Date: 05/23/2015     Maternal Data    Feeding    LATCH Score/Interventions                      Lactation Tools Discussed/Used     Consult Status      Huston Foley 05/23/2015, 12:07 PM

## 2015-05-24 ENCOUNTER — Encounter (HOSPITAL_COMMUNITY): Payer: Self-pay | Admitting: Obstetrics & Gynecology

## 2015-05-24 LAB — TYPE AND SCREEN
ABO/RH(D): B POS
Antibody Screen: NEGATIVE
Unit division: 0
Unit division: 0

## 2015-06-21 ENCOUNTER — Encounter: Payer: Self-pay | Admitting: Family Medicine

## 2015-06-21 ENCOUNTER — Ambulatory Visit (INDEPENDENT_AMBULATORY_CARE_PROVIDER_SITE_OTHER): Payer: 59 | Admitting: Family Medicine

## 2015-06-21 DIAGNOSIS — Z30430 Encounter for insertion of intrauterine contraceptive device: Secondary | ICD-10-CM | POA: Diagnosis not present

## 2015-06-21 DIAGNOSIS — Z3049 Encounter for surveillance of other contraceptives: Secondary | ICD-10-CM

## 2015-06-21 DIAGNOSIS — Z98891 History of uterine scar from previous surgery: Secondary | ICD-10-CM

## 2015-06-21 LAB — POCT URINE PREGNANCY: Preg Test, Ur: NEGATIVE

## 2015-06-21 MED ORDER — LEVONORGESTREL 20 MCG/24HR IU IUD
INTRAUTERINE_SYSTEM | Freq: Once | INTRAUTERINE | Status: AC
  Administered 2015-06-21: 1 via INTRAUTERINE

## 2015-06-21 NOTE — Patient Instructions (Signed)

## 2015-06-21 NOTE — Progress Notes (Signed)
Subjective:    Crystal [name] is a 29 y.o. G2P2012 African American female who presents for a postpartum visit. She is 1 month postpartum following a low cervical transverse Cesarean section. I have fully reviewed the prenatal and intrapartum course. The delivery was at 39 gestational weeks. Outcome: repeat cesarean section, low transverse incision. Anesthesia: epidural. Postpartum course has been uncomplicated. Baby's course has been uncomplicated. Baby is feeding by both breast and bottle. Bleeding no bleeding. Bowel function is normal. Bladder function is normal. Patient is not sexually active. Contraception method is currently abstinence, wants mirena. Postpartum depression screening: negative.  The following portions of the patient's history were reviewed and updated as appropriate: allergies, current medications, past medical history, past surgical history and problem list.  Review of Systems Pertinent items are noted in HPI.   Filed Vitals:   06/21/15 1453  BP: 145/69  Pulse: 90  Temp: 98.1 F (36.7 C)  TempSrc: Oral  Weight: 276 lb (125.193 kg)    Objective:     General:  alert, cooperative and no distress   Breasts:  deferred, no complaints  Lungs: clear to auscultation bilaterally  Heart:  regular rate and rhythm  Abdomen: soft, nontender   Vulva: normal  Vagina: normal vagina  Cervix:  closed  Corpus: Well-involuted  Adnexa:  Non-palpable  Rectal Exam: Not done        Assessment:   normal postpartum exam 4 wks s/p RLTCS Depression screening negative Contraception counseling   Plan:   Contraception: IUD Follow up in: 1 week for string check or as needed.   IUD INSERTION: Patient given informed consent, signed copy in the chart..  Negative pregnancy confirmed.  Appropriate time out taken.   Sterile instruments and technique was used. Cervix brought into view with use of speculum and then cleansed three times with  betadine swabs.  Due to body habitus and  retroverted uterus the cervix was very difficult to visualize. A tenaculum was placed into the anterior lip of the cervix and a uterine sound was used to measure uterine size.   A mirena IUD was placed into the endometrial cavity, deployed and secured. As the applicator was removed, the tenaculum tore through the cervix and the device was pulled into the vagina with the tug of the uterus pulling away from the freed tenaculum. The cervix was again visualized and the tenaculum repositioned with a larger bite of the cervix. A second device was placed into the endometrial cavity, deployed and secured. The strings were trimmed to 2 centimeters. There was a small tip of the device visible at the os. The patient was counseled on increased risk of expulsion given difficulties of insertion and postpartum status and told to put nothing in the vagina until her string check in 1 week.  There were no other complications and the patient tolerated the procedure well. There was only minimal bleeding at the cervix where the tenaculum pulled through which stopped with pressure from a fox swab.   She was given handouts for post procedure instructions and information about the IUD including a card with the time of recommended removal.

## 2015-07-02 ENCOUNTER — Encounter: Payer: Self-pay | Admitting: Family Medicine

## 2015-07-02 ENCOUNTER — Ambulatory Visit (INDEPENDENT_AMBULATORY_CARE_PROVIDER_SITE_OTHER): Payer: 59 | Admitting: Family Medicine

## 2015-07-02 VITALS — BP 133/76 | HR 65 | Temp 98.3°F | Wt 275.3 lb

## 2015-07-02 DIAGNOSIS — E669 Obesity, unspecified: Secondary | ICD-10-CM | POA: Insufficient documentation

## 2015-07-02 DIAGNOSIS — Z30431 Encounter for routine checking of intrauterine contraceptive device: Secondary | ICD-10-CM

## 2015-07-02 MED ORDER — PHENTERMINE HCL 30 MG PO CAPS: 30.0000 mg | ORAL_CAPSULE | ORAL | Status: DC

## 2015-07-02 MED ORDER — PHENTERMINE HCL 15 MG PO CAPS: 15.0000 mg | ORAL_CAPSULE | ORAL | Status: DC

## 2015-07-02 NOTE — Assessment & Plan Note (Signed)
Mirena inserted 1 week ago, present on exam with tip of device still protruding from cervical os - cautioned patient that it may still have increased risk of expulsion, manual string check weekly - call if problematic for sexual partner - call for other concerns - discussed this with Dr. Mauricio PoBreen who agreed with likely efficacy as long as it remains in place despite non-optimal positioning

## 2015-07-02 NOTE — Patient Instructions (Signed)
Please check that your IUD is still in place periodically by inserting a finger into your vagina and feeling for a bit of hard plastic. If you notice it fall out or it is not there when you check, please come in. Thanks!

## 2015-07-02 NOTE — Progress Notes (Signed)
   Subjective:   Crystal [name] is a 29 y.o. female with a history of obesity here for IUD check and weight loss discussion  Patient denies any pain or issues associated with IUD, does not think it has fallen out. She has not had intercourse in the past week as I cautioned against. She does reports increased appetite the first two days after insertion but this has since resolved.  Patient reports she is working hard to lose weight following the birth of her baby. She has been exercising for at least 30 minutes 3 times a week and cutting out sweets and junk foods and increasing veggies and lean meats. She reports that in the past phentermine has helped her lose weight and given her a motivational boost to help her stick with her lifestyle changes.  Review of Systems:  Per HPI. All other systems reviewed and are negative.   PMH, PSH, Medications, Allergies, and FmHx reviewed and updated in EMR.  Social History: former smoker  Objective:  BP 133/76 mmHg  Pulse 65  Temp(Src) 98.3 F (36.8 C) (Oral)  Wt 275 lb 4.8 oz (124.875 kg)  Gen:  29 y.o. female in NAD HEENT: NCAT, MMM, EOMI, PERRL, anicteric sclerae CV: RRR, no MRG, no JVD Resp: Non-labored, CTAB, no wheezes noted Abd: Soft, NTND, BS present, no guarding or organomegaly Ext: WWP, no edema MSK: Full ROM, strength intact Neuro: Alert and oriented, speech normal      Chemistry      Component Value Date/Time   NA 136 05/05/2015 1402   K 3.8 05/05/2015 1402   CL 106 05/05/2015 1402   CO2 20 05/05/2015 1402   BUN 5* 05/05/2015 1402   CREATININE 0.56 05/05/2015 1402   CREATININE 1.00 11/05/2013 1204      Component Value Date/Time   CALCIUM 8.5* 05/05/2015 1402   ALKPHOS 66 05/05/2015 1402   AST 17 05/05/2015 1402   ALT 11 05/05/2015 1402   BILITOT 0.3 05/05/2015 1402      Lab Results  Component Value Date   WBC 10.7* 05/22/2015   HGB 7.6* 05/22/2015   HCT 24.5* 05/22/2015   MCV 74.7* 05/22/2015   PLT 188 05/22/2015    Lab Results  Component Value Date   TSH 2.060 01/08/2009   No results found for: HGBA1C Assessment & Plan:     Crystal [name] is a 29 y.o. female here for IUD check and obesity  Intrauterine device surveillance Mirena inserted 1 week ago, present on exam with tip of device still protruding from cervical os - cautioned patient that it may still have increased risk of expulsion, manual string check weekly - call if problematic for sexual partner - call for other concerns - discussed this with Dr. Mauricio PoBreen who agreed with likely efficacy as long as it remains in place despite non-optimal positioning  Obesity BMI 44.5, reports good results with phentermine in the past - will start trial of phentermine to be combined with aggressive diet and exercise regimen, patient understand that medication will only help in combination with lifestyle changes and is committed to this - f/u in 1 month to recheck weight, also cautioned patient that it will not be continued beyond 3 months or without good follow-up on her part. - discussed specific diet and exercise goals and how these can be fit into patient's busy lifestyle with young children   Beverely LowElena Kara Mierzejewski, MD, MPH [facility] PGY-3 07/02/2015 4:43 PM

## 2015-07-02 NOTE — Assessment & Plan Note (Addendum)
BMI 44.5, reports good results with phentermine in the past - will start trial of phentermine to be combined with aggressive diet and exercise regimen, patient understand that medication will only help in combination with lifestyle changes and is committed to this - f/u in 1 month to recheck weight, also cautioned patient that it will not be continued beyond 3 months or without good follow-up on her part. - discussed specific diet and exercise goals and how these can be fit into patient's busy lifestyle with young children

## 2015-07-16 ENCOUNTER — Encounter: Payer: Self-pay | Admitting: Family Medicine

## 2015-07-16 ENCOUNTER — Telehealth: Payer: Self-pay | Admitting: Family Medicine

## 2015-07-16 NOTE — Telephone Encounter (Signed)
Pt called and would like Dr. Richarda BladeAdamo to write and fax a letter to her employer stating that she can return back to work. Please fax this to [phone] attention Dennard NipShannon Best. jw

## 2015-08-04 ENCOUNTER — Ambulatory Visit: Payer: 59 | Admitting: Family Medicine

## 2015-08-16 ENCOUNTER — Encounter: Payer: Self-pay | Admitting: Family Medicine

## 2015-08-16 ENCOUNTER — Ambulatory Visit (INDEPENDENT_AMBULATORY_CARE_PROVIDER_SITE_OTHER): Payer: 59 | Admitting: Family Medicine

## 2015-08-16 VITALS — BP 129/56 | HR 67 | Temp 98.1°F | Ht 66.0 in | Wt 270.0 lb

## 2015-08-16 DIAGNOSIS — Z30432 Encounter for removal of intrauterine contraceptive device: Secondary | ICD-10-CM | POA: Diagnosis not present

## 2015-08-16 DIAGNOSIS — Z30011 Encounter for initial prescription of contraceptive pills: Secondary | ICD-10-CM

## 2015-08-16 DIAGNOSIS — Z309 Encounter for contraceptive management, unspecified: Secondary | ICD-10-CM | POA: Diagnosis not present

## 2015-08-16 MED ORDER — DESOGESTREL-ETHINYL ESTRADIOL 0.15-30 MG-MCG PO TABS: 1.0000 | ORAL_TABLET | Freq: Every day | ORAL | Status: DC

## 2015-08-16 NOTE — Patient Instructions (Signed)

## 2015-08-19 NOTE — Assessment & Plan Note (Signed)
Has been on desogestrol COCs in the past and they worked well for her until she got pregnant which she attributes to forgeting pills sometimes. She has new strategies and is more motivated now so does not think this will be a problem going forward - removed mirena today - start first pill tomorrow morning, would expect normal withdrawal bleed in 3 weeks but may not get this due to lingering effects of mirena

## 2015-08-19 NOTE — Assessment & Plan Note (Signed)
Pt reports increased appetite and husband complaining he can feel device. Would like device removed and to go back to OCPs - device removed today

## 2015-08-19 NOTE — Progress Notes (Signed)
Subjective:   Timmie FoersterFigen T Dressel is a 29 y.o. female with a history of PCOS here for mirena removal.  Pt had mirena inserted 2 months ago. She skipped one period since then which she really likes but it has significantly increased her appetite and caused some weight gain which she is not pleased with. Also, she reports that her husband can feel the device during sex. She does not want to get pregnant at this time but is confident she can use oral contraceptives successfully at this point in her life and she was happier with this method in the past.  Review of Systems:  Per HPI. All other systems reviewed and are negative.   PMH, PSH, Medications, Allergies, and FmHx reviewed and updated in EMR.  Social History: former smoker  Objective:  BP 129/56 mmHg  Pulse 67  Temp(Src) 98.1 F (36.7 C) (Oral)  Ht 5\' 6"  (1.676 m)  Wt 270 lb (122.471 kg)  BMI 43.60 kg/m2  Gen:  29 y.o. female in NAD HEENT: NCAT, MMM, EOMI, PERRL, anicteric sclerae CV: RRR, no MRG, no JVD Resp: Non-labored, CTAB, no wheezes noted Abd: Soft, NTND, BS present, no guarding or organomegaly Ext: WWP, no edema MSK: Full ROM, strength intact Neuro: Alert and oriented, speech normal GU: normal vaginal canal, cervix very posterior and to the left, normal bimanual, no CMT      Chemistry      Component Value Date/Time   NA 136 05/05/2015 1402   K 3.8 05/05/2015 1402   CL 106 05/05/2015 1402   CO2 20 05/05/2015 1402   BUN 5* 05/05/2015 1402   CREATININE 0.56 05/05/2015 1402   CREATININE 1.00 11/05/2013 1204      Component Value Date/Time   CALCIUM 8.5* 05/05/2015 1402   ALKPHOS 66 05/05/2015 1402   AST 17 05/05/2015 1402   ALT 11 05/05/2015 1402   BILITOT 0.3 05/05/2015 1402      Lab Results  Component Value Date   WBC 10.7* 05/22/2015   HGB 7.6* 05/22/2015   HCT 24.5* 05/22/2015   MCV 74.7* 05/22/2015   PLT 188 05/22/2015   Lab Results  Component Value Date   TSH 2.060 01/08/2009   No results  found for: HGBA1C Assessment & Plan:     Timmie FoersterFigen T Yankey is a 29 y.o. female here for contraceptive management  Encounter for IUD removal Pt reports increased appetite and husband complaining he can feel device. Would like device removed and to go back to OCPs - device removed today  Encounter for BCP (birth control pills) initial prescription Has been on desogestrol COCs in the past and they worked well for her until she got pregnant which she attributes to forgeting pills sometimes. She has new strategies and is more motivated now so does not think this will be a problem going forward - removed mirena today - start first pill tomorrow morning, would expect normal withdrawal bleed in 3 weeks but may not get this due to lingering effects of mirena  PROCEDURE NOTE: IUD removal Patient given informed consent for IUD removal. She is aware this will stop the birth control method provided by the IUD immediately. Informed consent given and signed copy in the chart. Appropriate time out taken. Patient placed in the lithotomy position. Due to body habitus and cervical position the cervix was not able to be fully visualized so the speculum was withdrawn and the IUD strings were identified by touch coming from the cervical os. These strings were  grasped with ring forceps, and the IUD withdrawn gently from the uterus. There were no complications and no blood loss. Patient tolerated the procedure well.   Beverely Low, MD, MPH [facility] PGY-3 08/19/2015 1:45 PM

## 2015-09-02 ENCOUNTER — Telehealth: Payer: Self-pay | Admitting: Family Medicine

## 2015-09-02 NOTE — Telephone Encounter (Signed)
Attempted to call pt.  No answer and no machine.  Will need appt. Phuong Hillary, Maryjo RochesterJessica Dawn

## 2015-09-02 NOTE — Telephone Encounter (Signed)
Pt called and would like something called in for strep throat. She thinks she has this. jw

## 2016-04-19 ENCOUNTER — Other Ambulatory Visit: Payer: Self-pay | Admitting: *Deleted

## 2016-04-19 DIAGNOSIS — E669 Obesity, unspecified: Secondary | ICD-10-CM

## 2016-04-20 ENCOUNTER — Telehealth: Payer: Self-pay | Admitting: Internal Medicine

## 2016-04-20 NOTE — Telephone Encounter (Signed)
Pt wanted to know why her medication request was denied. Please advise. Thanks! ep

## 2016-04-20 NOTE — Telephone Encounter (Signed)
Called patient and let her know she would need to be seen in clinic before considering refilling phentermine. She expressed understanding.

## 2016-04-21 NOTE — Telephone Encounter (Signed)
Pt scheduled for an appt. Nery Kalisz, CMA  

## 2016-05-02 ENCOUNTER — Ambulatory Visit (INDEPENDENT_AMBULATORY_CARE_PROVIDER_SITE_OTHER): Payer: 59 | Admitting: Internal Medicine

## 2016-05-02 ENCOUNTER — Encounter: Payer: Self-pay | Admitting: Internal Medicine

## 2016-05-02 VITALS — BP 131/73 | HR 72 | Temp 98.6°F | Ht 66.0 in | Wt 252.2 lb

## 2016-05-02 DIAGNOSIS — Z713 Dietary counseling and surveillance: Secondary | ICD-10-CM | POA: Diagnosis not present

## 2016-05-02 DIAGNOSIS — D649 Anemia, unspecified: Secondary | ICD-10-CM | POA: Diagnosis not present

## 2016-05-02 LAB — CBC WITH DIFFERENTIAL/PLATELET
Basophils Absolute: 0 cells/uL (ref 0–200)
Basophils Relative: 0 %
Eosinophils Absolute: 71 cells/uL (ref 15–500)
Eosinophils Relative: 1 %
HCT: 36.6 % (ref 35.0–45.0)
Hemoglobin: 11.6 g/dL — ABNORMAL LOW (ref 11.7–15.5)
Lymphocytes Relative: 30 %
Lymphs Abs: 2130 cells/uL (ref 850–3900)
MCH: 23.9 pg — ABNORMAL LOW (ref 27.0–33.0)
MCHC: 31.7 g/dL — ABNORMAL LOW (ref 32.0–36.0)
MCV: 75.5 fL — ABNORMAL LOW (ref 80.0–100.0)
MPV: 9.7 fL (ref 7.5–12.5)
Monocytes Absolute: 355 cells/uL (ref 200–950)
Monocytes Relative: 5 %
Neutro Abs: 4544 cells/uL (ref 1500–7800)
Neutrophils Relative %: 64 %
Platelets: 358 10*3/uL (ref 140–400)
RBC: 4.85 MIL/uL (ref 3.80–5.10)
RDW: 15.6 % — ABNORMAL HIGH (ref 11.0–15.0)
WBC: 7.1 10*3/uL (ref 3.8–10.8)

## 2016-05-02 NOTE — Patient Instructions (Addendum)
[name],  Crystal QuinYou are doing a great job on your weight loss journey!  I will call you with the results of your blood test.  You are not eating very much food as it is. I would recommend having 3 regular meals a day to keep your metabolism from stalling. There are cardiac risks of taking phentermine, but short term use can be effective. Let's continue to talk about this once we get your blood test results.  If you would like to meet with me and our nutritionist Dr. Gerilyn PilgrimSykes, we have appointments the mornings of the 24th and 31st.  Best, Dr. Sampson GoonFitzgerald

## 2016-05-02 NOTE — Progress Notes (Signed)
[facility] Progress [facility]  Subjective:  Crystal [name] is a 29-y/o female who presents to discuss desire for weight loss.  Goal of weight loss: - Requesting phentermine, which has helped patient have more energy and decreased appetite in the past - Was last prescribed this medicine last fall after giving birth and thought it gave her a boost but initially stopped taking it because thought it made her mouth dry - Has been walking in the morning for 30 minutes and on breaks at work for 30 minutes; has always walked at work but began morning walk about 1 month ago - Stopped eating red meat August 1st - Usually skips either breakfast or lunch because she gets busy  - Likes yogurt, peanuts and grapes for snacks. Had salmon, rice, and vegetables for dinner last night (typical meal).  - Does report having daily fatigue but has been told she has iron deficiency and has not been taking supplementation - Would like to weigh about 230 lbs; says she does not like the way she looks under this weight ROS: No palpitations or chest pain, no dizziness  Social: Former smoker  No Known Allergies  Objective: Blood pressure 131/73, pulse 72, temperature 98.6 F (37 C), temperature source Oral, height 5\' 6"  (1.676 m), weight 252 lb 3.2 oz (114.4 kg), unknown if currently breastfeeding. Constitutional: Obese, well-appearing female, pleasant and in NAD Cardiovascular: RRR, S1, S2, no m/r/g.  Pulmonary/Chest: Effort normal and breath sounds normal. No respiratory distress.  Abdominal: Soft. +BS, NT, ND, no rebound or guarding.  Musculoskeletal: No LE edema Psychiatric: Normal mood and affect.  Vitals reviewed  Assessment/Plan: Weight loss counseling, encounter for - Pt has already lost 20 lbs since the end of last year - Motivated and has increased physical activity and is watching what she eats - Recommended having at least 3 regular meals daily to maintain energy levels  - Will check CBC  for anemia -- suspect this is cause of patient's fatigue, in which case iron supplementation would be more appropriate than starting phentermine - Counseled patient that she was already not eating as much as I would recommend, and appetite suppressant is not a great strategy for long-term weight loss but we could continue to discuss since she has already made lifestyle changes  Follow-up in about 1 month to assess weight loss.   Crystal GobbleHillary Berit Raczkowski, MD [facility], PGY-2

## 2016-05-03 DIAGNOSIS — Z713 Dietary counseling and surveillance: Secondary | ICD-10-CM | POA: Insufficient documentation

## 2016-05-03 NOTE — Assessment & Plan Note (Signed)
-   Pt has already lost 20 lbs since the end of last year - Motivated and has increased physical activity and is watching what she eats - Recommended having at least 3 regular meals daily to maintain energy levels  - Will check CBC for anemia -- suspect this is cause of patient's fatigue, in which case iron supplementation would be more appropriate than starting phentermine - Counseled patient that she was already not eating as much as I would recommend, and appetite suppressant is not a great strategy for long-term weight loss but we could continue to discuss since she has already made lifestyle changes

## 2016-05-04 ENCOUNTER — Telehealth: Payer: Self-pay | Admitting: Internal Medicine

## 2016-05-04 MED ORDER — FERROUS SULFATE 325 (65 FE) MG PO TABS: 325.0000 mg | ORAL_TABLET | Freq: Every day | ORAL | 3 refills | Status: DC

## 2016-05-04 NOTE — Telephone Encounter (Signed)
Left message about microcytic anemia. Told patient I would recommend taking ferrous sulfate daily, especially as she is not eating red meat anymore. Sent prescription to pharmacy. Let patient know I think she should try iron supplementation for energy first, rather than phentermine, especially she is already not eating very much.

## 2016-08-14 ENCOUNTER — Other Ambulatory Visit: Payer: Self-pay | Admitting: *Deleted

## 2016-08-14 DIAGNOSIS — Z30011 Encounter for initial prescription of contraceptive pills: Secondary | ICD-10-CM

## 2016-08-16 MED ORDER — DESOGESTREL-ETHINYL ESTRADIOL 0.15-30 MG-MCG PO TABS: 1.0000 | ORAL_TABLET | Freq: Every day | ORAL | 11 refills | Status: DC

## 2016-11-11 ENCOUNTER — Emergency Department (HOSPITAL_COMMUNITY): Payer: 59

## 2016-11-11 ENCOUNTER — Encounter (HOSPITAL_COMMUNITY): Payer: Self-pay | Admitting: *Deleted

## 2016-11-11 ENCOUNTER — Emergency Department (HOSPITAL_COMMUNITY)
Admission: EM | Admit: 2016-11-11 | Discharge: 2016-11-11 | Disposition: A | Discharge status: Home / Self Care | Payer: 59 | Attending: Emergency Medicine | Admitting: Emergency Medicine

## 2016-11-11 DIAGNOSIS — Z87891 Personal history of nicotine dependence: Secondary | ICD-10-CM | POA: Diagnosis not present

## 2016-11-11 DIAGNOSIS — Z79899 Other long term (current) drug therapy: Secondary | ICD-10-CM | POA: Insufficient documentation

## 2016-11-11 DIAGNOSIS — I10 Essential (primary) hypertension: Secondary | ICD-10-CM | POA: Diagnosis not present

## 2016-11-11 DIAGNOSIS — G44309 Post-traumatic headache, unspecified, not intractable: Secondary | ICD-10-CM | POA: Diagnosis not present

## 2016-11-11 DIAGNOSIS — Y999 Unspecified external cause status: Secondary | ICD-10-CM | POA: Diagnosis not present

## 2016-11-11 DIAGNOSIS — S0083XA Contusion of other part of head, initial encounter: Secondary | ICD-10-CM | POA: Diagnosis not present

## 2016-11-11 DIAGNOSIS — Y929 Unspecified place or not applicable: Secondary | ICD-10-CM | POA: Diagnosis not present

## 2016-11-11 DIAGNOSIS — S0093XA Contusion of unspecified part of head, initial encounter: Secondary | ICD-10-CM

## 2016-11-11 DIAGNOSIS — S0993XA Unspecified injury of face, initial encounter: Secondary | ICD-10-CM | POA: Diagnosis not present

## 2016-11-11 DIAGNOSIS — S0990XA Unspecified injury of head, initial encounter: Secondary | ICD-10-CM | POA: Diagnosis present

## 2016-11-11 DIAGNOSIS — Y939 Activity, unspecified: Secondary | ICD-10-CM | POA: Diagnosis not present

## 2016-11-11 LAB — I-STAT BETA HCG BLOOD, ED (MC, WL, AP ONLY): I-stat hCG, quantitative: <5 m[IU]/mL (ref <5)

## 2016-11-11 MED ORDER — NAPROXEN 375 MG PO TABS: 375.0000 mg | ORAL_TABLET | Freq: Two times a day (BID) | ORAL | 0 refills | Status: AC | PRN

## 2016-11-11 MED ORDER — HYDROCODONE-ACETAMINOPHEN 5-325 MG PO TABS
2.0000 | ORAL_TABLET | Freq: Once | ORAL | Status: AC
  Administered 2016-11-11: 2 via ORAL
  Filled 2016-11-11: qty 2

## 2016-11-11 MED ORDER — HYDROCODONE-ACETAMINOPHEN 5-325 MG PO TABS: 1.0000 | ORAL_TABLET | ORAL | 0 refills | Status: DC | PRN
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
## 2016-11-16 ENCOUNTER — Encounter: Payer: Self-pay | Admitting: Internal Medicine

## 2016-11-16 ENCOUNTER — Other Ambulatory Visit (HOSPITAL_COMMUNITY)
Admission: RE | Admit: 2016-11-16 | Discharge: 2016-11-16 | Disposition: A | Discharge status: Home / Self Care | Payer: 59 | Source: Ambulatory Visit | Attending: Family Medicine | Admitting: Family Medicine

## 2016-11-16 ENCOUNTER — Ambulatory Visit (INDEPENDENT_AMBULATORY_CARE_PROVIDER_SITE_OTHER): Payer: 59 | Admitting: Internal Medicine

## 2016-11-16 VITALS — BP 110/78 | HR 78 | Temp 98.5°F | Ht 66.0 in | Wt 261.0 lb

## 2016-11-16 DIAGNOSIS — F39 Unspecified mood [affective] disorder: Secondary | ICD-10-CM | POA: Diagnosis not present

## 2016-11-16 DIAGNOSIS — N898 Other specified noninflammatory disorders of vagina: Secondary | ICD-10-CM | POA: Diagnosis not present

## 2016-11-16 DIAGNOSIS — Z30011 Encounter for initial prescription of contraceptive pills: Secondary | ICD-10-CM

## 2016-11-16 DIAGNOSIS — Z113 Encounter for screening for infections with a predominantly sexual mode of transmission: Secondary | ICD-10-CM | POA: Insufficient documentation

## 2016-11-16 DIAGNOSIS — F411 Generalized anxiety disorder: Secondary | ICD-10-CM | POA: Diagnosis not present

## 2016-11-16 LAB — POCT WET PREP (WET MOUNT)
Clue Cells Wet Prep Whiff POC: NEGATIVE
Trichomonas Wet Prep HPF POC: ABSENT

## 2016-11-16 MED ORDER — DESOGESTREL-ETHINYL ESTRADIOL 0.15-30 MG-MCG PO TABS: 1.0000 | ORAL_TABLET | Freq: Every day | ORAL | 11 refills | Status: DC

## 2016-11-16 MED ORDER — CITALOPRAM HYDROBROMIDE 20 MG PO TABS: 20.0000 mg | ORAL_TABLET | Freq: Every day | ORAL | 1 refills | Status: DC

## 2016-11-16 MED ORDER — LORAZEPAM 0.5 MG PO TABS: 0.5000 mg | ORAL_TABLET | Freq: Every evening | ORAL | 0 refills | Status: DC | PRN

## 2016-11-16 NOTE — Patient Instructions (Signed)
[name],  I recommend starting celexa 20 mg daily. You may need an increase in dose. Please follow-up in about 1 month to see how things are going. I have also prescribed ativan to take as needed as night while your body gets used to celexa. If there are nights you don't think you need it, don't take it.  I will call you with the results of your other lab tests.  Best, Dr. Sampson GoonFitzgerald
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
## 2016-11-17 ENCOUNTER — Telehealth: Payer: Self-pay | Admitting: Psychology

## 2016-11-17 LAB — GC/CHLAMYDIA PROBE AMP (~~LOC~~) NOT AT ARMC
Chlamydia: NEGATIVE
Neisseria Gonorrhea: NEGATIVE

## 2016-11-17 NOTE — Telephone Encounter (Signed)
Patient called back.  Gave her options for more traditional therapy (referral out) or in house with [facility].  She didn't know.  I suggested she meet with us first and we can help her determine next step.  I was concerned she would be less likely to follow-up with an outside referral.  I scheduled her for Friday at 1:30 with Vaibhav.  I asked if she was okay with meeting with a female counselor and she said she thought so.  I asked her to call me back if she changed her mind.
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
## 2016-11-19 DIAGNOSIS — F39 Unspecified mood [affective] disorder: Secondary | ICD-10-CM | POA: Insufficient documentation

## 2016-11-19 DIAGNOSIS — N898 Other specified noninflammatory disorders of vagina: Secondary | ICD-10-CM | POA: Insufficient documentation
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
## 2016-11-19 NOTE — Assessment & Plan Note (Signed)
-   Chronic, monthly. Suspect may be 2/2 normal hormonal fluctuations during menstrual cycle.  - Obtained wet prep, gc/chlaymdia - Patient declined RPR and HIV testing  - Refilled OCPs, as patient would like to restart

## 2016-11-24 ENCOUNTER — Ambulatory Visit (INDEPENDENT_AMBULATORY_CARE_PROVIDER_SITE_OTHER): Payer: 59 | Admitting: Psychology

## 2016-11-24 DIAGNOSIS — F39 Unspecified mood [affective] disorder: Secondary | ICD-10-CM
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
## 2016-12-04 ENCOUNTER — Telehealth: Payer: Self-pay | Admitting: Psychology

## 2016-12-11 NOTE — Telephone Encounter (Signed)
Erroneous encounter

## 2016-12-15 ENCOUNTER — Ambulatory Visit: Payer: 59 | Admitting: Psychology

## 2016-12-15 DIAGNOSIS — F39 Unspecified mood [affective] disorder: Secondary | ICD-10-CM
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
## 2017-03-12 ENCOUNTER — Other Ambulatory Visit (HOSPITAL_COMMUNITY): Payer: Self-pay | Admitting: Surgery

## 2017-03-27 ENCOUNTER — Encounter: Payer: 59 | Attending: Surgery | Admitting: Registered"

## 2017-03-27 ENCOUNTER — Encounter: Payer: Self-pay | Admitting: Registered"

## 2017-03-27 DIAGNOSIS — Z713 Dietary counseling and surveillance: Secondary | ICD-10-CM | POA: Insufficient documentation

## 2017-03-27 DIAGNOSIS — E669 Obesity, unspecified: Secondary | ICD-10-CM

## 2017-03-27 NOTE — Progress Notes (Signed)
Pre-Op Assessment Visit:  Pre-Operative Sleeve gastrectomy Surgery  Medical Nutrition Therapy:  Appt start time: 9:20  End time:  10:08  Patient was seen on 03/27/2017 for Pre-Operative Nutrition Assessment. Assessment and [facility] coordinator on 03/27/2017.   Pt expectation of surgery: "to feel confident to lose weight, to have a better eating schedule, maintain weight loss"  Pt expectation of Dietitian: "help me to know what to eat and how much protein, fat, and carbs to eat "  Start weight at [facility]: 266.9 BMI: 42.43   Per insurance, pt needs 6 SWL visits prior to surgery. Pt states she has done 3 SWL visits with Bariatric Clinic and will verify with [facility] if that will count towards required 6 visits.     24 hr Dietary Recall: First Meal: skips sometimes; Fast food-english muffin Snack: chips, trail mix, candy Second Meal: spaghetti or hamburger helper or hot dog or pizza Snack: chips, trail mix, candy Third Meal: spaghetti or hamburger helper or pizza with  Macaroni and cheese or broccoli or green beans or rice Snack: none Beverages: juice, soda (sometimes)  Encouraged to engage in 150 minutes of moderate physical activity including cardiovascular and weight baring weekly  Handouts given during visit include:  . Pre-Op Goals . Bariatric Surgery Protein Shakes . Vitamin and Mineral Recommendations  During the appointment today the following Pre-Op Goals were reviewed with the patient: . Maintain or lose weight as instructed by your surgeon . Make healthy food choices . Begin to limit portion sizes . Limited concentrated sugars and fried foods . Keep fat/sugar in the single digits per serving on          food labels . Practice CHEWING your food  (aim for 30 chews per bite or until applesauce consistency) . Practice not drinking 15 minutes before, during, and 30 minutes after each meal/snack . Avoid all  carbonated beverages  . Avoid/limit caffeinated beverages  . Avoid all sugar-sweetened beverages . Consume 3 meals per day; eat every 3-5 hours . Make a list of non-food related activities . Aim for 64-100 ounces of FLUID daily  . Aim for at least 60-80 grams of PROTEIN daily . Look for a liquid protein source that contain ?15 g protein and ?5 g carbohydrate  (ex: shakes, drinks, shots) . Physical activity is an important part of a healthy lifestyle so keep it moving!  Follow diet recommendations listed below Energy and Macronutrient Recommendations: Calories: 1800 Carbohydrate: 200 Protein: 135 Fat: 50  Demonstrated degree of understanding via:  Teach Back   Teaching Method Utilized:  Visual Auditory Hands on  Barriers to learning/adherence to lifestyle change: none  Patient to call the [facility] to enroll in Pre-Op and Post-Op Nutrition Education when surgery date is scheduled.

## 2017-04-03 ENCOUNTER — Encounter: Payer: Self-pay | Admitting: Internal Medicine

## 2017-04-03 ENCOUNTER — Ambulatory Visit (INDEPENDENT_AMBULATORY_CARE_PROVIDER_SITE_OTHER): Payer: 59 | Admitting: Internal Medicine

## 2017-04-03 VITALS — BP 108/70 | HR 67 | Temp 98.8°F | Wt 267.0 lb

## 2017-04-03 DIAGNOSIS — L509 Urticaria, unspecified: Secondary | ICD-10-CM | POA: Diagnosis not present

## 2017-04-03 MED ORDER — CETIRIZINE HCL 10 MG PO TABS: 10.0000 mg | ORAL_TABLET | Freq: Every day | ORAL | 4 refills | Status: DC

## 2017-04-03 NOTE — Patient Instructions (Signed)
[name],  For your hives, I recommend trying zyrtec 10 mg daily for the next couple of weeks. Then you may be able to take this just as needed. Sometimes people need to stay on this long term to prevent recurrent itchy welts.   If you don't have improvement, let me know, and I can place a referral for allergy testing.  Best, Dr. Sampson GoonFitzgerald   Hives Hives (urticaria) are itchy, red, swollen areas on your skin. Hives can show up on any part of your body, and they can vary in size. They can be as small as the tip of a pen or much larger. Hives often fade within 24 hours (acute hives). In other cases, new hives show up after old ones fade. This can continue for many days or weeks (chronic hives). Hives are caused by your body's reaction to an irritant or to something that you are allergic to (trigger). You can get hives right after being around a trigger or hours later. Hives do not spread from person to person (are not contagious). Hives may get worse if you scratch them, if you exercise, or if you have worries (emotional stress). Follow these instructions at home: Medicines  Take or apply over-the-counter and prescription medicines only as told by your doctor.  If you were prescribed an antibiotic medicine, use it as told by your doctor. Do not stop taking the antibiotic even if you start to feel better. Skin Care  Apply cool, wet cloths (cool compresses) to the itchy, red, swollen areas.  Do not scratch your skin. Do not rub your skin. General instructions  Do not take hot showers or baths. This can make itching worse.  Do not wear tight clothes.  Use sunscreen and wear clothing that covers your skin when you are outside.  Avoid any triggers that cause your hives. Keep a journal to help you keep track of what causes your hives. Write down: ? What medicines you take. ? What you eat and drink. ? What products you use on your skin.  Keep all follow-up visits as told by your doctor.  This is important. Contact a doctor if:  Your symptoms are not better with medicine.  Your joints are painful or swollen. Get help right away if:  You have a fever.  You have belly pain.  Your tongue or lips are swollen.  Your eyelids are swollen.  Your chest or throat feels tight.  You have trouble breathing or swallowing. These symptoms may be an emergency. Do not wait to see if the symptoms will go away. Get medical help right away. Call your local emergency services (911 in the [place] Do not drive yourself to the hospital. This information is not intended to replace advice given to you by your health care provider. Make sure you discuss any questions you have with your health care provider. Document Released: 06/06/2008 Document Revised: 02/03/2016 Document Reviewed: 06/16/2015 Elsevier Interactive Patient Education  2018 [employer]

## 2017-04-03 NOTE — Progress Notes (Signed)
[facility] Progress Note  Subjective:  Crystal [name] is a 31 y.o. female with history of allergic rhinitis, iron deficiency, and obesity who presents for urticaria.  #Hives: - Keeps getting itchy hives over forearms and thighs - Began earlier this year but is occurring more frequently this month -- a few times a week - Has not tried anything for this - Does not seem correlated with being inside or outside. Occurs randomly. Has been paying attention to what she's been eating but has not noticed any patterns. - No pets. No recent travel. No changes to soaps or detergents. - Cannot scratch and bring out hives. Begins itching after hive appears.  - Showed photo with welts across R wrist.  ROS: No shortness of breath or swelling of lips or tongue  No Known Allergies  Objective: Blood pressure 108/70, pulse 67, temperature 98.8 F (37.1 C), temperature source Oral, weight 267 lb (121.1 kg), last menstrual period 03/11/2017, SpO2 99 %, unknown if currently breastfeeding. Body mass index is 42.45 kg/m. Constitutional: Obese female, in NAD HENT: MMM. Normal posterior oropharynx. Cardiovascular: RRR, S1, S2, no m/r/g.  Pulmonary/Chest: Effort normal and breath sounds normal. No respiratory distress.  Musculoskeletal: No LE edema. Skin: No current hives. Has a few varicose veins of upper thighs.  Psychiatric: Normal mood and affect.  Vitals reviewed  Assessment/Plan: Urticaria - Unknown trigger. - Recommended taking zyrtec daily for the next few weeks and as needed thereafter. - If not improving, asked patient to call back. Could increase zyrtec to 20 mg daily and would consider allergy referral.   Follow-up prn.  Dani GobbleHillary Charle Clear, MD [facility], PGY-3

## 2017-04-04 DIAGNOSIS — L509 Urticaria, unspecified: Secondary | ICD-10-CM | POA: Insufficient documentation

## 2017-04-04 NOTE — Assessment & Plan Note (Signed)
-   Unknown trigger. - Recommended taking zyrtec daily for the next few weeks and as needed thereafter. - If not improving, asked patient to call back. Could increase zyrtec to 20 mg daily and would consider allergy referral.

## 2017-04-13 ENCOUNTER — Ambulatory Visit (HOSPITAL_COMMUNITY)
Admission: RE | Admit: 2017-04-13 | Discharge: 2017-04-13 | Disposition: A | Discharge status: Home / Self Care | Payer: 59 | Source: Ambulatory Visit | Attending: Surgery | Admitting: Surgery

## 2017-04-13 ENCOUNTER — Other Ambulatory Visit: Payer: Self-pay

## 2017-04-13 DIAGNOSIS — Z01818 Encounter for other preprocedural examination: Secondary | ICD-10-CM | POA: Diagnosis not present

## 2017-04-13 DIAGNOSIS — Z6841 Body Mass Index (BMI) 40.0 and over, adult: Secondary | ICD-10-CM | POA: Insufficient documentation

## 2017-04-18 ENCOUNTER — Encounter: Payer: 59 | Attending: Surgery | Admitting: Skilled Nursing Facility1

## 2017-04-18 ENCOUNTER — Encounter: Payer: Self-pay | Admitting: Skilled Nursing Facility1

## 2017-04-18 DIAGNOSIS — E669 Obesity, unspecified: Secondary | ICD-10-CM

## 2017-04-18 DIAGNOSIS — Z713 Dietary counseling and surveillance: Secondary | ICD-10-CM | POA: Insufficient documentation

## 2017-04-18 NOTE — Patient Instructions (Signed)
-  Keep up chewing  -Keep up getting in the water  -Find a protein shake you like  -Keep up having something for breakfast   -Add one extra day of cardio for 30 minutes: walking in the neighborhood or a hip hop video

## 2017-04-18 NOTE — Progress Notes (Signed)
Sleeve Gastrectomy  Assessment:   1st SWL Appointment.  Per insurance, pt needs 6 SWL visits prior to surgery. Pt states she has done 3 SWL visits with Bariatric Clinic and will verify with [facility] if that will count towards required 6 visits.   Pt states she is positive for H.Pulori so she is on antibiotics. Needs the surgery before october Pt states Chewing well has been gross and a struggle and increasing fluid specifically drinking more water trying the flavored packets and also started protein shakes for breakfast liking muscle milk which is too sweet. Pt states she slips on the weekends with food choices. Pt states she has cut out soda and juice. Pt states she usually gets 4-6 hours a night.   Start Wt at [facility]: 266.9 Wt: 267.3 BMI: 42.48  MEDICATIONS: See List   DIETARY INTAKE:  24-hr recall:  B ( AM): protein shake----skipped Snk ( AM):   L ( PM): skipped Snk ( PM):  D ( PM): salad: cheese, eggs, carrots, tomatoes, ham Snk ( PM):  Beverages: water with flavored packets (maybe 30 ounces), protein shake  Usual physical activity: walking at work for 30 minutes a day 4 days a week  Diet to Follow: 1600 calories 180 g carbohydrates 120 g protein 44 g fat   Nutritional Diagnosis:  [place]-3.3 Overweight/obesity related to past poor dietary habits and physical inactivity as evidenced by patient w/ planned sleeve gastrectomy surgery following dietary guidelines for continued weight loss.    Intervention:  Nutrition counseling for upcoming Bariatric Surgery. Goals: -Encouraged to engage in 150 minutes of moderate physical activity including cardiovascular and weight baring weekly -Keep up chewing -Keep up getting in the water -Find a protein shake you like -Keep up having something for breakfast  -Add one extra day of cardio for 30 minutes: walking in the neighborhood or a hip hop video  Teaching Method Utilized:  Visual Auditory Hands on  Barriers to learning/adherence to  lifestyle change: none identified  Demonstrated degree of understanding via:  Teach Back   Monitoring/Evaluation:  Dietary intake, exercise,  and body weight prn.

## 2017-05-17 ENCOUNTER — Encounter: Payer: Self-pay | Admitting: Registered"

## 2017-05-17 ENCOUNTER — Encounter: Payer: 59 | Attending: Surgery | Admitting: Registered"

## 2017-05-17 DIAGNOSIS — Z713 Dietary counseling and surveillance: Secondary | ICD-10-CM | POA: Diagnosis present

## 2017-05-17 DIAGNOSIS — E669 Obesity, unspecified: Secondary | ICD-10-CM

## 2017-05-17 NOTE — Progress Notes (Signed)
Sleeve Gastrectomy  Assessment:  2nd SWL Appointment.   Start time: 10:05           End time: 10:25  Pt arrives having gained 5.8 lbs from previous visit. Pt states she is going to call [facility] after today's SWL appt to see if insurance will approve everything, including visits from previous visits Bariatric Clinic. Pt states if approved tentative surgery date is 10/9. Pt states she has slipped a lot lately. Pt states recently her 30 year old niece has moved in with her family and they are still adjusting. Pt states she is working on chewing. Pt states she has tried a variety of protein shakes. Pt states she is still walking as physical activity and also doing more with being active outside with children. Pt states she has started drinking coffee recently.  Per insurance, pt needs 6 SWL visits prior to surgery. Pt states she has done 3 SWL visits with Bariatric Clinic and will verify with [facility] if that will count towards required 6 visits.   Pt states she is positive for H.Pylori so she is on antibiotics. Needs the surgery before october Pt states Chewing well has been gross and a struggle and increasing fluid specifically drinking more water trying the flavored packets and also started protein shakes for breakfast liking muscle milk which is too sweet. Pt states she slips on the weekends with food choices. Pt states she has cut out soda and juice. Pt states she usually gets 4-6 hours a night.    Start Wt at [facility]: 266.9 Wt: 273.1 BMI: 43.42  MEDICATIONS: See List   DIETARY INTAKE:  24-hr recall:  B ( AM): protein shake----skipped Snk ( AM):   L ( PM): skipped Snk ( PM):  D ( PM): salad: cheese, eggs, carrots, tomatoes, ham Snk ( PM):  Beverages: water with flavored packets (maybe 30 ounces), protein shake, coffee  Usual physical activity: walking at work for 30 minutes a day 4 days a week  Diet to Follow: 1600 calories 180 g carbohydrates 120 g protein 44 g fat   Nutritional  Diagnosis:  [place]-3.3 Overweight/obesity related to past poor dietary habits and physical inactivity as evidenced by patient w/ planned sleeve gastrectomy surgery following dietary guidelines for continued weight loss.    Intervention:  Nutrition counseling for upcoming Bariatric Surgery. Goals: - Aim to for eating 3 meals/day and having snacks between meals.  - Aim to keep water bottle on you to help increase fluid intake to at least 64 oz a day. - Try a variety of premier protein flavors. Find a couple that you enjoy.  - Try premier protein clear drink ([employer] or [employer] on [address] - Reduce caffeine intake.  Teaching Method Utilized:  Visual Auditory Hands on  Barriers to learning/adherence to lifestyle change: none identified  Demonstrated degree of understanding via:  Teach Back   Monitoring/Evaluation:  Dietary intake, exercise,  and body weight prn.

## 2017-05-17 NOTE — Patient Instructions (Addendum)
-   Aim to for eating 3 meals/day and having snacks between meals.   - Aim to keep water bottle on you to help increase fluid intake to at least 64 oz a day.  - Try a variety of premier protein flavors. Find a couple that you enjoy.   - Try premier protein clear drink ([employer] or [employer] on [address]).  - Reduce caffeine intake.

## 2017-05-23 ENCOUNTER — Telehealth: Payer: Self-pay | Admitting: Internal Medicine

## 2017-05-23 NOTE — Telephone Encounter (Signed)
Pt is scheduled to gastric sleeve surgery 06-19-17.  She is asking for a printout of her weight for past year.  Please let her know when this is ready for pickup.

## 2017-05-28 ENCOUNTER — Encounter: Payer: 59 | Admitting: Registered"

## 2017-05-28 DIAGNOSIS — E669 Obesity, unspecified: Secondary | ICD-10-CM

## 2017-05-28 DIAGNOSIS — Z713 Dietary counseling and surveillance: Secondary | ICD-10-CM | POA: Diagnosis not present

## 2017-05-28 NOTE — Telephone Encounter (Signed)
Patient is aware that flowsheet is ready at the front desk. Woodard Perrell,CMA

## 2017-05-28 NOTE — Progress Notes (Signed)
  Pre-Operative Nutrition Class:  Appt start time: 8:15   End time:  9:15  Patient was seen on 05/28/2017 for Pre-Operative Bariatric Surgery Education at [facility].   Surgery date: 06/19/2017 Surgery type: Sleeve gastrectomy Start weight at [facility]: 266.9 Weight today: 273.5   Samples given per MNT protocol. Patient educated on appropriate usage: Bariatric Advantage Multivitamin Lot # [id number] Exp: 02/2018  Bariatric Advantage Calcium Citrate Lot # [id number] Exp: 08/29/2017  Premier Protein Clear Drink Lot # [id number] Exp: 08/07/2017   The following the learning objectives were met by the patient during this course:  Identify Pre-Op Dietary Goals and will begin 2 weeks pre-operatively  Identify appropriate sources of fluids and proteins   State protein recommendations and appropriate sources pre and post-operatively  Identify Post-Operative Dietary Goals and will follow for 2 weeks post-operatively  Identify appropriate multivitamin and calcium sources  Describe the need for physical activity post-operatively and will follow MD recommendations  State when to call healthcare provider regarding medication questions or post-operative complications  Handouts given during class include:  Pre-Op Bariatric Surgery Diet Handout  Protein Shake Handout  Post-Op Bariatric Surgery Nutrition Handout  BELT Program Information Flyer  Support Group Information Flyer  [facility] Bariatric Supplements Price List  Follow-Up Plan: Patient will follow-up at [facility] 2 weeks post operatively for diet advancement per MD.

## 2017-06-13 ENCOUNTER — Ambulatory Visit: Payer: Self-pay | Admitting: Surgery

## 2017-06-13 NOTE — H&P (Signed)
Crystal [name] 06/13/2017 4:38 PM Location: [facility] Patient #: [id number] DOB: [birth year] Single / Language: [name] / Race: Black or African American Female  History of Present Illness ([name] A. Fredricka Bonine MD; 06/13/2017 4:49 PM) Patient words: She is here today for her preoperative appointment. She has completed the workup. No barriers were identified; she has been cleared by the nutritionist as well as a psychologist. Upper GI and chest x-ray were normal. Labs were normal with the exception of positive H. pylori which has been treated. 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 She has tried multiple methods of weight loss without success. She has several friends and acquaintances who have had weight loss surgery with good results and has been considering surgery for quite some time. She has been overweight since she was child and reports that most of her family has problems with obesity. She is interested in sleeve gastrectomy.  The patient is a 31 year old female who presents for a bariatric surgery evaluation. Initial onset of obesity was in childhood. Current diet includes well balanced meals. 1-2 times per week. Pertinent family history includes obesity and diabetes. The patient is currently able to do activities of daily living without limitations, able to work without limitations, able to do housework without limitations and able to participate in sports without limitations. Past treatment has included low calorie diet, exercise regimen, appetite suppressants and weight loss group. Surgical history: C-section - elective. Psychiatric History: The patient denies depression, bipolar disorder, anxiety or psychiatric hospitalizations. Cardiac history: The patient denies history of angina, history of heart attack, history of stroke, pulmonary embolus, sleep apnea, anti-platelets or  smoking. Gastrointestinal History: Patient denies heartburn, dysphagia or irritable bowel disease.  The patient is a 31 year old female.   Allergies [name], [place]; 06/13/2017 4:40 PM) No Known Allergies 03/09/2017 Allergies Reconciled  Medication History [name], [place]; 06/13/2017 4:40 PM) Medications Reconciled     Review of Systems (Adolph Clutter A. Fredricka Bonine MD; 06/13/2017 4:49 PM) All other systems negative  Vitals [name] CMA; 06/13/2017 4:40 PM) 06/13/2017 4:39 PM Weight: 273.6 lb Height: 67in Body Surface Area: 2.31 m Body Mass Index: 42.85 kg/m  Temp.: 98.77F  Pulse: 83 (Regular)  BP: 120/82 (Sitting, Left Arm, Standard)      Physical Exam (Kiyo Heal A. Duanne Duchesne MD; 06/13/2017 4:50 PM)  The physical exam findings are as follows: Note:She is alert and well-appearing Unlabored respirations Regular rate and rhythm Abdomen soft and nontender    Assessment & Plan (Giavonni Fonder A. Kiron Osmun MD; 06/13/2017 4:51 PM)  MORBID OBESITY (E66.01) Story: She has been deemed a good candidate by all consultants and I agree that she is an excellent candidate for sleeve gastrectomy. We discussed again the nature of the surgery, the anticipated hospital stay and postoperative recovery, the risks of surgery. All of her questions are answered and she is eager to proceed  From initial visit: This is a lifelong problem for her and she is interested in weight loss surgery. I discussed the sleeve gastrectomy with her and I believe this is a good option for her given her lack of comorbidities or reflux. We discussed the risk of surgery, risks of bleeding, infection, pain, scarring, intra-abdominal injury, staple line leak and sequelae, as well as risks of general anesthesia including blood clots, pneumonia, heart attack stroke and death. We discussed the pathway surgery including nutritional  and psychological evaluations and I emphasized to her the importance of these things in  order to gain knowledge experience which will carry her on the rest of her weight loss journey once the tool of surgery has stopped serving her. She expressed understanding and her many insightful questions were answered. We will get her started on the pathway for sleeve.

## 2017-06-14 ENCOUNTER — Ambulatory Visit: Payer: 59 | Admitting: Registered"

## 2017-06-14 NOTE — Progress Notes (Signed)
04/13/2017- noted in EPIC- EKG and CXR

## 2017-06-14 NOTE — Patient Instructions (Addendum)
DETRIA CUMMINGS  06/14/2017   Your procedure is scheduled on: Tuesday 06/19/2017  Report to [facility] Main  Entrance Take [place]  elevators to 3rd floor to  Short Stay Center at   0530  AM.    Call this number if you have problems the morning of surgery [phone]    Remember: ONLY 1 PERSON MAY GO WITH YOU TO SHORT STAY TO GET  READY MORNING OF YOUR SURGERY.   Do not eat food or drink liquids :After Midnight.     Take these medicines the morning of surgery with A SIP OF WATER: none                                You may not have any metal on your body including hair pins and              piercings  Do not wear jewelry, make-up, lotions, powders or perfumes, deodorant             Do not wear nail polish.  Do not shave  48 hours prior to surgery.              Men may shave face and neck.   Do not bring valuables to the hospital. [facility] IS NOT             RESPONSIBLE   FOR VALUABLES.  Contacts, dentures or bridgework may not be worn into surgery.  Leave suitcase in the car. After surgery it may be brought to your room.                  Please read over the following fact sheets you were given: _____________________________________________________________________             [facility] - Preparing for Surgery Before surgery, you can play an important role.  Because skin is not sterile, your skin needs to be as free of germs as possible.  You can reduce the number of germs on your skin by washing with CHG (chlorahexidine gluconate) soap before surgery.  CHG is an antiseptic cleaner which kills germs and bonds with the skin to continue killing germs even after washing. Please DO NOT use if you have an allergy to CHG or antibacterial soaps.  If your skin becomes reddened/irritated stop using the CHG and inform your nurse when you arrive at Short Stay. Do not shave (including legs and underarms) for at least 48 hours prior to the first CHG shower.  You  may shave your face/neck. Please follow these instructions carefully:  1.  Shower with CHG Soap the night before surgery and the  morning of Surgery.  2.  If you choose to wash your hair, wash your hair first as usual with your  normal  shampoo.  3.  After you shampoo, rinse your hair and body thoroughly to remove the  shampoo.                           4.  Use CHG as you would any other liquid soap.  You can apply chg directly  to the skin and wash                       Gently with a scrungie or clean  washcloth.  5.  Apply the CHG Soap to your body ONLY FROM THE NECK DOWN.   Do not use on face/ open                           Wound or open sores. Avoid contact with eyes, ears mouth and genitals (private parts).                       Wash face,  Genitals (private parts) with your normal soap.             6.  Wash thoroughly, paying special attention to the area where your surgery  will be performed.  7.  Thoroughly rinse your body with warm water from the neck down.  8.  DO NOT shower/wash with your normal soap after using and rinsing off  the CHG Soap.                9.  Pat yourself dry with a clean towel.            10.  Wear clean pajamas.            11.  Place clean sheets on your bed the night of your first shower and do not  sleep with pets. Day of Surgery : Do not apply any lotions/deodorants the morning of surgery.  Please wear clean clothes to the hospital/surgery center.  FAILURE TO FOLLOW THESE INSTRUCTIONS MAY RESULT IN THE CANCELLATION OF YOUR SURGERY PATIENT SIGNATURE_________________________________  NURSE SIGNATURE__________________________________  ________________________________________________________________________

## 2017-06-15 ENCOUNTER — Encounter (HOSPITAL_COMMUNITY)
Admission: RE | Admit: 2017-06-15 | Discharge: 2017-06-15 | Disposition: A | Discharge status: Home / Self Care | Payer: 59 | Source: Ambulatory Visit | Attending: Surgery | Admitting: Surgery

## 2017-06-15 ENCOUNTER — Encounter (HOSPITAL_COMMUNITY): Payer: Self-pay

## 2017-06-15 DIAGNOSIS — Z01818 Encounter for other preprocedural examination: Secondary | ICD-10-CM | POA: Diagnosis not present

## 2017-06-15 HISTORY — DX: Anxiety disorder, unspecified: F41.9

## 2017-06-15 HISTORY — DX: Major depressive disorder, single episode, unspecified: F32.9

## 2017-06-15 HISTORY — DX: Depression, unspecified: F32.A

## 2017-06-15 LAB — COMPREHENSIVE METABOLIC PANEL
ALT: 20 U/L (ref 14–54)
AST: 22 U/L (ref 15–41)
Albumin: 3.8 g/dL (ref 3.5–5.0)
Alkaline Phosphatase: 50 U/L (ref 38–126)
Anion gap: 5 (ref 5–15)
BUN: 10 mg/dL (ref 6–20)
CO2: 27 mmol/L (ref 22–32)
Calcium: 9.2 mg/dL (ref 8.9–10.3)
Chloride: 107 mmol/L (ref 101–111)
Creatinine, Ser: 0.87 mg/dL (ref 0.44–1.00)
GFR calc Af Amer: >60 mL/min (ref >60)
GFR calc non Af Amer: >60 mL/min (ref >60)
Glucose, Bld: 90 mg/dL (ref 65–99)
Potassium: 4 mmol/L (ref 3.5–5.1)
Sodium: 139 mmol/L (ref 135–145)
Total Bilirubin: 0.5 mg/dL (ref 0.3–1.2)
Total Protein: 7.5 g/dL (ref 6.5–8.1)

## 2017-06-15 LAB — CBC WITH DIFFERENTIAL/PLATELET
Basophils Absolute: 0 10*3/uL (ref 0.0–0.1)
Basophils Relative: 0 %
Eosinophils Absolute: 0.1 10*3/uL (ref 0.0–0.7)
Eosinophils Relative: 1 %
HCT: 37.5 % (ref 36.0–46.0)
Hemoglobin: 11.7 g/dL — ABNORMAL LOW (ref 12.0–15.0)
Lymphocytes Relative: 33 %
Lymphs Abs: 2.6 10*3/uL (ref 0.7–4.0)
MCH: 25.3 pg — ABNORMAL LOW (ref 26.0–34.0)
MCHC: 31.2 g/dL (ref 30.0–36.0)
MCV: 81.2 fL (ref 78.0–100.0)
Monocytes Absolute: 0.3 10*3/uL (ref 0.1–1.0)
Monocytes Relative: 4 %
Neutro Abs: 5 10*3/uL (ref 1.7–7.7)
Neutrophils Relative %: 62 %
Platelets: 325 10*3/uL (ref 150–400)
RBC: 4.62 MIL/uL (ref 3.87–5.11)
RDW: 13.6 % (ref 11.5–15.5)
WBC: 8 10*3/uL (ref 4.0–10.5)

## 2017-07-03 ENCOUNTER — Ambulatory Visit: Payer: 59

## 2017-07-03 ENCOUNTER — Encounter (HOSPITAL_COMMUNITY): Payer: Self-pay | Admitting: *Deleted

## 2017-07-10 ENCOUNTER — Inpatient Hospital Stay (HOSPITAL_COMMUNITY): Payer: 59 | Admitting: Registered Nurse

## 2017-07-10 ENCOUNTER — Encounter (HOSPITAL_COMMUNITY)
Admission: RE | Disposition: A | Discharge status: Home / Self Care | Payer: Self-pay | Source: Ambulatory Visit | Attending: Surgery

## 2017-07-10 ENCOUNTER — Inpatient Hospital Stay (HOSPITAL_COMMUNITY)
Admission: RE | Admit: 2017-07-10 | Discharge: 2017-07-11 | DRG: 621 | Disposition: A | Discharge status: Home / Self Care | Payer: 59 | Source: Ambulatory Visit | Attending: Surgery | Admitting: Surgery

## 2017-07-10 ENCOUNTER — Encounter (HOSPITAL_COMMUNITY): Payer: Self-pay | Admitting: *Deleted

## 2017-07-10 DIAGNOSIS — Z6841 Body Mass Index (BMI) 40.0 and over, adult: Secondary | ICD-10-CM | POA: Diagnosis not present

## 2017-07-10 DIAGNOSIS — Z87891 Personal history of nicotine dependence: Secondary | ICD-10-CM

## 2017-07-10 DIAGNOSIS — B9681 Helicobacter pylori [H. pylori] as the cause of diseases classified elsewhere: Secondary | ICD-10-CM | POA: Diagnosis present

## 2017-07-10 DIAGNOSIS — I10 Essential (primary) hypertension: Secondary | ICD-10-CM | POA: Diagnosis present

## 2017-07-10 DIAGNOSIS — Z9884 Bariatric surgery status: Secondary | ICD-10-CM

## 2017-07-10 DIAGNOSIS — R030 Elevated blood-pressure reading, without diagnosis of hypertension: Secondary | ICD-10-CM | POA: Diagnosis not present

## 2017-07-10 DIAGNOSIS — K2950 Unspecified chronic gastritis without bleeding: Secondary | ICD-10-CM | POA: Diagnosis not present

## 2017-07-10 DIAGNOSIS — D509 Iron deficiency anemia, unspecified: Secondary | ICD-10-CM | POA: Diagnosis not present

## 2017-07-10 HISTORY — PX: LAPAROSCOPIC GASTRIC SLEEVE RESECTION: SHX5895

## 2017-07-10 HISTORY — DX: Bariatric surgery status: Z98.84

## 2017-07-10 LAB — PREGNANCY, URINE: Preg Test, Ur: NEGATIVE

## 2017-07-10 SURGERY — GASTRECTOMY, SLEEVE, LAPAROSCOPIC
Anesthesia: General

## 2017-07-10 MED ORDER — CEFOTETAN DISODIUM-DEXTROSE 2-2.08 GM-%(50ML) IV SOLR
2.0000 g | INTRAVENOUS | Status: AC
  Administered 2017-07-10: 2 g via INTRAVENOUS
  Filled 2017-07-10: qty 50

## 2017-07-10 MED ORDER — OXYCODONE HCL 5 MG/5ML PO SOLN
5.0000 mg | ORAL | Status: DC | PRN
  Administered 2017-07-10 – 2017-07-11 (×4): 5 mg via ORAL
  Filled 2017-07-10 (×3): qty 5

## 2017-07-10 MED ORDER — HYDROMORPHONE HCL 1 MG/ML IJ SOLN
0.2500 mg | INTRAMUSCULAR | Status: DC | PRN
  Administered 2017-07-10 (×2): 0.5 mg via INTRAVENOUS

## 2017-07-10 MED ORDER — DEXAMETHASONE SODIUM PHOSPHATE 4 MG/ML IJ SOLN: 4.0000 mg | INTRAMUSCULAR | Status: DC

## 2017-07-10 MED ORDER — PROPOFOL 10 MG/ML IV BOLUS
INTRAVENOUS | Status: AC
  Filled 2017-07-10: qty 40

## 2017-07-10 MED ORDER — HYDROMORPHONE HCL 1 MG/ML IJ SOLN
0.5000 mg | INTRAMUSCULAR | Status: DC | PRN
  Administered 2017-07-10 – 2017-07-11 (×3): 0.5 mg via INTRAVENOUS
  Filled 2017-07-10 (×3): qty 0.5

## 2017-07-10 MED ORDER — FENTANYL CITRATE (PF) 100 MCG/2ML IJ SOLN
INTRAMUSCULAR | Status: DC | PRN
  Administered 2017-07-10 (×2): 50 ug via INTRAVENOUS

## 2017-07-10 MED ORDER — LIDOCAINE 2% (20 MG/ML) 5 ML SYRINGE
INTRAMUSCULAR | Status: DC | PRN
  Administered 2017-07-10: 80 mg via INTRAVENOUS

## 2017-07-10 MED ORDER — SCOPOLAMINE 1 MG/3DAYS TD PT72
1.0000 | MEDICATED_PATCH | TRANSDERMAL | Status: DC
  Administered 2017-07-10: 1.5 mg via TRANSDERMAL
  Filled 2017-07-10: qty 1

## 2017-07-10 MED ORDER — HYDROMORPHONE HCL 1 MG/ML IJ SOLN
INTRAMUSCULAR | Status: AC
  Filled 2017-07-10: qty 2

## 2017-07-10 MED ORDER — DEXAMETHASONE SODIUM PHOSPHATE 10 MG/ML IJ SOLN
INTRAMUSCULAR | Status: DC | PRN
  Administered 2017-07-10: 10 mg via INTRAVENOUS

## 2017-07-10 MED ORDER — ONDANSETRON HCL 4 MG/2ML IJ SOLN
INTRAMUSCULAR | Status: AC
  Filled 2017-07-10: qty 2

## 2017-07-10 MED ORDER — LACTATED RINGERS IR SOLN
Status: DC | PRN
  Administered 2017-07-10: 1000 mL

## 2017-07-10 MED ORDER — MIDAZOLAM HCL 5 MG/5ML IJ SOLN
INTRAMUSCULAR | Status: DC | PRN
  Administered 2017-07-10: 2 mg via INTRAVENOUS

## 2017-07-10 MED ORDER — GABAPENTIN 300 MG PO CAPS
300.0000 mg | ORAL_CAPSULE | ORAL | Status: AC
  Administered 2017-07-10: 300 mg via ORAL
  Filled 2017-07-10: qty 1

## 2017-07-10 MED ORDER — CELECOXIB 200 MG PO CAPS
400.0000 mg | ORAL_CAPSULE | ORAL | Status: AC
  Administered 2017-07-10: 400 mg via ORAL
  Filled 2017-07-10: qty 2

## 2017-07-10 MED ORDER — ONDANSETRON HCL 4 MG/2ML IJ SOLN: 4.0000 mg | INTRAMUSCULAR | Status: DC | PRN

## 2017-07-10 MED ORDER — ENOXAPARIN SODIUM 40 MG/0.4ML ~~LOC~~ SOLN
40.0000 mg | SUBCUTANEOUS | Status: AC
  Administered 2017-07-10: 40 mg via SUBCUTANEOUS
  Filled 2017-07-10: qty 0.4

## 2017-07-10 MED ORDER — ROCURONIUM BROMIDE 50 MG/5ML IV SOSY
PREFILLED_SYRINGE | INTRAVENOUS | Status: AC
  Filled 2017-07-10: qty 5

## 2017-07-10 MED ORDER — GABAPENTIN 250 MG/5ML PO SOLN
200.0000 mg | Freq: Two times a day (BID) | ORAL | Status: DC
  Administered 2017-07-10: 200 mg via ORAL
  Filled 2017-07-10 (×2): qty 4

## 2017-07-10 MED ORDER — SUGAMMADEX SODIUM 500 MG/5ML IV SOLN
INTRAVENOUS | Status: DC | PRN
  Administered 2017-07-10: 250 mg via INTRAVENOUS

## 2017-07-10 MED ORDER — HEMOSTATIC AGENTS (NO CHARGE) OPTIME
TOPICAL | Status: DC | PRN
  Administered 2017-07-10: 1

## 2017-07-10 MED ORDER — CHLORHEXIDINE GLUCONATE 0.12 % MT SOLN
15.0000 mL | Freq: Two times a day (BID) | OROMUCOSAL | Status: DC
  Administered 2017-07-10: 15 mL via OROMUCOSAL
  Filled 2017-07-10: qty 15

## 2017-07-10 MED ORDER — ONDANSETRON HCL 4 MG/2ML IJ SOLN
INTRAMUSCULAR | Status: DC | PRN
  Administered 2017-07-10: 4 mg via INTRAVENOUS

## 2017-07-10 MED ORDER — ACETAMINOPHEN 160 MG/5ML PO SOLN: 650.0000 mg | ORAL | Status: DC | PRN

## 2017-07-10 MED ORDER — LIDOCAINE 2% (20 MG/ML) 5 ML SYRINGE
INTRAMUSCULAR | Status: DC | PRN
  Administered 2017-07-10: 1 mg/kg/h via INTRAVENOUS

## 2017-07-10 MED ORDER — HYDRALAZINE HCL 20 MG/ML IJ SOLN: 10.0000 mg | INTRAMUSCULAR | Status: DC | PRN

## 2017-07-10 MED ORDER — DEXAMETHASONE SODIUM PHOSPHATE 10 MG/ML IJ SOLN
INTRAMUSCULAR | Status: AC
  Filled 2017-07-10: qty 1

## 2017-07-10 MED ORDER — MIDAZOLAM HCL 2 MG/2ML IJ SOLN
INTRAMUSCULAR | Status: AC
  Filled 2017-07-10: qty 2

## 2017-07-10 MED ORDER — PREMIER PROTEIN SHAKE
2.0000 [oz_av] | ORAL | Status: DC
  Administered 2017-07-11 (×2): 2 [oz_av] via ORAL

## 2017-07-10 MED ORDER — EPHEDRINE 5 MG/ML INJ
INTRAVENOUS | Status: AC
  Filled 2017-07-10: qty 10

## 2017-07-10 MED ORDER — ORAL CARE MOUTH RINSE
15.0000 mL | Freq: Two times a day (BID) | OROMUCOSAL | Status: DC
  Administered 2017-07-10: 15 mL via OROMUCOSAL

## 2017-07-10 MED ORDER — METOPROLOL TARTRATE 5 MG/5ML IV SOLN
5.0000 mg | Freq: Four times a day (QID) | INTRAVENOUS | Status: DC | PRN
  Administered 2017-07-11: 5 mg via INTRAVENOUS
  Filled 2017-07-10: qty 5

## 2017-07-10 MED ORDER — PANTOPRAZOLE SODIUM 40 MG IV SOLR
40.0000 mg | Freq: Every day | INTRAVENOUS | Status: DC
  Administered 2017-07-10: 40 mg via INTRAVENOUS
  Filled 2017-07-10: qty 40

## 2017-07-10 MED ORDER — ENOXAPARIN SODIUM 30 MG/0.3ML ~~LOC~~ SOLN
30.0000 mg | Freq: Two times a day (BID) | SUBCUTANEOUS | Status: DC
  Administered 2017-07-11: 30 mg via SUBCUTANEOUS
  Filled 2017-07-10: qty 0.3

## 2017-07-10 MED ORDER — SODIUM CHLORIDE 0.9 % IV SOLN
INTRAVENOUS | Status: DC
  Administered 2017-07-10: 19:00:00 via INTRAVENOUS
  Administered 2017-07-11: 1000 mL via INTRAVENOUS

## 2017-07-10 MED ORDER — BUPIVACAINE-EPINEPHRINE (PF) 0.25% -1:200000 IJ SOLN
INTRAMUSCULAR | Status: AC
  Filled 2017-07-10: qty 30

## 2017-07-10 MED ORDER — LIDOCAINE 2% (20 MG/ML) 5 ML SYRINGE
INTRAMUSCULAR | Status: AC
  Filled 2017-07-10: qty 5

## 2017-07-10 MED ORDER — CHLORHEXIDINE GLUCONATE 4 % EX LIQD: 60.0000 mL | Freq: Once | CUTANEOUS | Status: DC

## 2017-07-10 MED ORDER — ACETAMINOPHEN 500 MG PO TABS
1000.0000 mg | ORAL_TABLET | ORAL | Status: AC
  Administered 2017-07-10: 1000 mg via ORAL
  Filled 2017-07-10: qty 2

## 2017-07-10 MED ORDER — FENTANYL CITRATE (PF) 100 MCG/2ML IJ SOLN
INTRAMUSCULAR | Status: AC
  Filled 2017-07-10: qty 2

## 2017-07-10 MED ORDER — BUPIVACAINE-EPINEPHRINE 0.25% -1:200000 IJ SOLN
INTRAMUSCULAR | Status: DC | PRN
  Administered 2017-07-10: 30 mL

## 2017-07-10 MED ORDER — APREPITANT 40 MG PO CAPS
40.0000 mg | ORAL_CAPSULE | ORAL | Status: AC
  Administered 2017-07-10: 40 mg via ORAL
  Filled 2017-07-10: qty 1

## 2017-07-10 MED ORDER — ROCURONIUM BROMIDE 10 MG/ML (PF) SYRINGE
PREFILLED_SYRINGE | INTRAVENOUS | Status: DC | PRN
  Administered 2017-07-10: 50 mg via INTRAVENOUS
  Administered 2017-07-10: 30 mg via INTRAVENOUS

## 2017-07-10 MED ORDER — LIDOCAINE 2% (20 MG/ML) 5 ML SYRINGE
INTRAMUSCULAR | Status: AC
Start: 2017-07-10 — End: 2017-07-10
  Filled 2017-07-10: qty 10

## 2017-07-10 MED ORDER — EPHEDRINE SULFATE-NACL 50-0.9 MG/10ML-% IV SOSY
PREFILLED_SYRINGE | INTRAVENOUS | Status: DC | PRN
  Administered 2017-07-10: 5 mg via INTRAVENOUS

## 2017-07-10 MED ORDER — SUGAMMADEX SODIUM 500 MG/5ML IV SOLN
INTRAVENOUS | Status: AC
  Filled 2017-07-10: qty 5

## 2017-07-10 MED ORDER — PROPOFOL 10 MG/ML IV BOLUS
INTRAVENOUS | Status: DC | PRN
Start: 2017-07-10 — End: 2017-07-10
  Administered 2017-07-10: 200 mg via INTRAVENOUS

## 2017-07-10 MED ORDER — BUPIVACAINE LIPOSOME 1.3 % IJ SUSP
20.0000 mL | Freq: Once | INTRAMUSCULAR | Status: AC
  Administered 2017-07-10: 20 mL
  Filled 2017-07-10: qty 20

## 2017-07-10 MED ORDER — LACTATED RINGERS IV SOLN
INTRAVENOUS | Status: DC
Start: 2017-07-10 — End: 2017-07-10
  Administered 2017-07-10 (×2): via INTRAVENOUS

## 2017-07-10 MED ORDER — PROMETHAZINE HCL 25 MG/ML IJ SOLN: 6.2500 mg | INTRAMUSCULAR | Status: DC | PRN

## 2017-07-10 MED ORDER — SIMETHICONE 80 MG PO CHEW: 80.0000 mg | CHEWABLE_TABLET | Freq: Four times a day (QID) | ORAL | Status: DC | PRN

## 2017-07-10 MED ORDER — LIDOCAINE HCL 2 % IJ SOLN
INTRAMUSCULAR | Status: AC
  Filled 2017-07-10: qty 20

## 2017-07-10 SURGICAL SUPPLY — 70 items
APL SKNCLS STERI-STRIP NONHPOA (GAUZE/BANDAGES/DRESSINGS) ×1
APPLICATOR COTTON TIP 6IN STRL (MISCELLANEOUS) IMPLANT
APPLIER CLIP ROT 10 11.4 M/L (STAPLE) ×2
APPLIER CLIP ROT 13.4 12 LRG (CLIP)
APR CLP LRG 13.4X12 ROT 20 MLT (CLIP)
APR CLP MED LRG 11.4X10 (STAPLE) ×1
BAG LAPAROSCOPIC 12 15 PORT 16 (BASKET) IMPLANT
BAG RETRIEVAL 12/15 (BASKET) ×2
BANDAGE ADH SHEER 1  50/CT (GAUZE/BANDAGES/DRESSINGS) ×12 IMPLANT
BENZOIN TINCTURE PRP APPL 2/3 (GAUZE/BANDAGES/DRESSINGS) ×2 IMPLANT
BLADE SURG SZ11 CARB STEEL (BLADE) ×2 IMPLANT
CABLE HIGH FREQUENCY MONO STRZ (ELECTRODE) ×2 IMPLANT
CHLORAPREP W/TINT 26ML (MISCELLANEOUS) ×3 IMPLANT
CLIP APPLIE ROT 10 11.4 M/L (STAPLE) IMPLANT
CLIP APPLIE ROT 13.4 12 LRG (CLIP) IMPLANT
COVER SURGICAL LIGHT HANDLE (MISCELLANEOUS) ×2 IMPLANT
DECANTER SPIKE VIAL GLASS SM (MISCELLANEOUS) ×2 IMPLANT
DEVICE SUT QUICK LOAD TK 5 (STAPLE) IMPLANT
DEVICE SUT TI-KNOT TK 5X26 (MISCELLANEOUS) IMPLANT
DRAPE UTILITY XL STRL (DRAPES) ×3 IMPLANT
ELECT REM PT RETURN 15FT ADLT (MISCELLANEOUS) ×2 IMPLANT
GAUZE SPONGE 4X4 12PLY STRL (GAUZE/BANDAGES/DRESSINGS) IMPLANT
GLOVE BIO SURGEON STRL SZ 6 (GLOVE) ×2 IMPLANT
GLOVE INDICATOR 6.5 STRL GRN (GLOVE) ×2 IMPLANT
GOWN STRL REUS W/TWL LRG LVL3 (GOWN DISPOSABLE) ×4 IMPLANT
GOWN STRL REUS W/TWL XL LVL3 (GOWN DISPOSABLE) ×3 IMPLANT
GRASPER SUT TROCAR 14GX15 (MISCELLANEOUS) ×2 IMPLANT
HEMOSTAT SURGICEL 4X8 (HEMOSTASIS) ×1 IMPLANT
HOVERMATT SINGLE USE (MISCELLANEOUS) ×2 IMPLANT
KIT BASIN OR (CUSTOM PROCEDURE TRAY) ×2 IMPLANT
MARKER SKIN DUAL TIP RULER LAB (MISCELLANEOUS) ×2 IMPLANT
NDL SPNL 22GX3.5 QUINCKE BK (NEEDLE) ×1 IMPLANT
NEEDLE SPNL 22GX3.5 QUINCKE BK (NEEDLE) ×2 IMPLANT
NS IRRIG 1000ML POUR BTL (IV SOLUTION) ×1 IMPLANT
PACK UNIVERSAL I (CUSTOM PROCEDURE TRAY) ×2 IMPLANT
RELOAD ENDO STITCH (ENDOMECHANICALS) IMPLANT
RELOAD STAPLE 60 3.6 BLU REG (STAPLE) ×1 IMPLANT
RELOAD STAPLE 60 3.8 GOLD REG (STAPLE) ×1 IMPLANT
RELOAD STAPLE 60 4.1 GRN THCK (STAPLE) IMPLANT
RELOAD STAPLER BLUE 60MM (STAPLE) ×2 IMPLANT
RELOAD STAPLER GOLD 60MM (STAPLE) ×2 IMPLANT
RELOAD STAPLER GREEN 60MM (STAPLE) IMPLANT
RELOAD SUT TRIPLE-STITCH 2-0 (ENDOMECHANICALS) IMPLANT
SCISSORS LAP 5X45 EPIX DISP (ENDOMECHANICALS) ×2 IMPLANT
SET IRRIG TUBING LAPAROSCOPIC (IRRIGATION / IRRIGATOR) ×2 IMPLANT
SHEARS HARMONIC ACE PLUS 45CM (MISCELLANEOUS) ×2 IMPLANT
SLEEVE ADV FIXATION 5X100MM (TROCAR) ×4 IMPLANT
SLEEVE GASTRECTOMY 40FR VISIGI (MISCELLANEOUS) ×2 IMPLANT
SOLUTION ANTI FOG 6CC (MISCELLANEOUS) ×2 IMPLANT
SPONGE LAP 18X18 X RAY DECT (DISPOSABLE) ×2 IMPLANT
STAPLER ECHELON BIOABSB 60 FLE (MISCELLANEOUS) ×9 IMPLANT
STAPLER ECHELON LONG 60 440 (INSTRUMENTS) ×2 IMPLANT
STAPLER RELOAD BLUE 60MM (STAPLE) ×4
STAPLER RELOAD GOLD 60MM (STAPLE) ×4
STAPLER RELOAD GREEN 60MM (STAPLE)
STRIP CLOSURE SKIN 1/2X4 (GAUZE/BANDAGES/DRESSINGS) ×2 IMPLANT
SUT MNCRL AB 4-0 PS2 18 (SUTURE) ×2 IMPLANT
SUT SURGIDAC NAB ES-9 0 48 120 (SUTURE) IMPLANT
SUT VICRYL 0 TIES 12 18 (SUTURE) ×2 IMPLANT
SYR 10ML ECCENTRIC (SYRINGE) ×2 IMPLANT
SYR 20CC LL (SYRINGE) ×2 IMPLANT
SYR 50ML LL SCALE MARK (SYRINGE) ×2 IMPLANT
TOWEL OR 17X26 10 PK STRL BLUE (TOWEL DISPOSABLE) ×2 IMPLANT
TOWEL OR NON WOVEN STRL DISP B (DISPOSABLE) ×2 IMPLANT
TROCAR ADV FIXATION 5X100MM (TROCAR) ×2 IMPLANT
TROCAR BLADELESS 15MM (ENDOMECHANICALS) ×2 IMPLANT
TROCAR BLADELESS OPT 5 100 (ENDOMECHANICALS) ×2 IMPLANT
TUBING CONNECTING 10 (TUBING) ×3 IMPLANT
TUBING ENDO SMARTCAP (MISCELLANEOUS) ×2 IMPLANT
TUBING INSUF HEATED (TUBING) ×2 IMPLANT

## 2017-07-10 NOTE — Discharge Instructions (Signed)

## 2017-07-10 NOTE — Progress Notes (Signed)
Discussed post op day goals with patient including ambulation, IS, diet progression, pain, and nausea control.  Questions answered. 

## 2017-07-10 NOTE — H&P (View-Only) (Signed)
Crystal [name] 06/13/2017 4:38 PM Location: [facility] Patient #: [id number] DOB: Jan 17, 1986 Single / Language: [place] / Race: Black or African American Female  History of Present Illness ([name] A. [name] MD; 06/13/2017 4:49 PM) Patient words: She is here today for her preoperative appointment. She has completed the workup. No barriers were identified; she has been cleared by the nutritionist as well as a psychologist. Upper GI and chest x-ray were normal. Labs were normal with the exception of positive H. pylori which has been treated. 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 She has tried multiple methods of weight loss without success. She has several friends and acquaintances who have had weight loss surgery with good results and has been considering surgery for quite some time. She has been overweight since she was child and reports that most of her family has problems with obesity. She is interested in sleeve gastrectomy.  The patient is a 31 year old female who presents for a bariatric surgery evaluation. Initial onset of obesity was in childhood. Current diet includes well balanced meals. 1-2 times per week. Pertinent family history includes obesity and diabetes. The patient is currently able to do activities of daily living without limitations, able to work without limitations, able to do housework without limitations and able to participate in sports without limitations. Past treatment has included low calorie diet, exercise regimen, appetite suppressants and weight loss group. Surgical history: C-section - elective. Psychiatric History: The patient denies depression, bipolar disorder, anxiety or psychiatric hospitalizations. Cardiac history: The patient denies history of angina, history of heart attack, history of stroke, pulmonary embolus, sleep apnea, anti-platelets or  smoking. Gastrointestinal History: Patient denies heartburn, dysphagia or irritable bowel disease.  The patient is a 31 year old female.   Allergies [name]([name], [place]; 06/13/2017 4:40 PM) No Known Allergies 03/09/2017 Allergies Reconciled  Medication History [name]([name], [place]; 06/13/2017 4:40 PM) Medications Reconciled     Review of Systems ([name] A. [name] MD; 06/13/2017 4:49 PM) All other systems negative  Vitals [name]([name] CMA; 06/13/2017 4:40 PM) 06/13/2017 4:39 PM Weight: 273.6 lb Height: 67in Body Surface Area: 2.31 m Body Mass Index: 42.85 kg/m  Temp.: 98.11F  Pulse: 83 (Regular)  BP: 120/82 (Sitting, Left Arm, Standard)      Physical Exam ([name] A. [name] MD; 06/13/2017 4:50 PM)  The physical exam findings are as follows: Note:She is alert and well-appearing Unlabored respirations Regular rate and rhythm Abdomen soft and nontender    Assessment & Plan ([name] A. [name] MD; 06/13/2017 4:51 PM)  MORBID OBESITY (E66.01) Story: She has been deemed a good candidate by all consultants and I agree that she is an excellent candidate for sleeve gastrectomy. We discussed again the nature of the surgery, the anticipated hospital stay and postoperative recovery, the risks of surgery. All of her questions are answered and she is eager to proceed  From initial visit: This is a lifelong problem for her and she is interested in weight loss surgery. I discussed the sleeve gastrectomy with her and I believe this is a good option for her given her lack of comorbidities or reflux. We discussed the risk of surgery, risks of bleeding, infection, pain, scarring, intra-abdominal injury, staple line leak and sequelae, as well as risks of general anesthesia including blood clots, pneumonia, heart attack stroke and death. We discussed the pathway surgery including nutritional  and psychological evaluations and I emphasized to her the importance of these things in  order to gain knowledge experience which will carry her on the rest of her weight loss journey once the tool of surgery has stopped serving her. She expressed understanding and her many insightful questions were answered. We will get her started on the pathway for sleeve. 

## 2017-07-10 NOTE — Anesthesia Procedure Notes (Signed)
Procedure Name: Intubation Date/Time: 07/10/2017 9:55 AM Performed by: Carleene Cooper A Pre-anesthesia Checklist: Patient identified, Emergency Drugs available, Suction available, Patient being monitored and Timeout performed Patient Re-evaluated:Patient Re-evaluated prior to induction Oxygen Delivery Method: Circle system utilized Preoxygenation: Pre-oxygenation with 100% oxygen Induction Type: IV induction Ventilation: Mask ventilation without difficulty Laryngoscope Size: Mac and 4 Grade View: Grade I Tube type: Oral Number of attempts: 1 Airway Equipment and Method: Stylet Placement Confirmation: ETT inserted through vocal cords under direct vision,  positive ETCO2 and breath sounds checked- equal and bilateral Secured at: 21 cm Tube secured with: Tape Dental Injury: Teeth and Oropharynx as per pre-operative assessment

## 2017-07-10 NOTE — Anesthesia Preprocedure Evaluation (Signed)
Anesthesia Evaluation  Patient identified by MRN, date of birth, ID band Patient awake    Reviewed: Allergy & Precautions, NPO status , Patient's Chart, lab work & pertinent test results  Airway Mallampati: II  TM Distance: >3 FB Neck ROM: Full    Dental no notable dental hx.    Pulmonary neg pulmonary ROS, former smoker,    Pulmonary exam normal breath sounds clear to auscultation       Cardiovascular hypertension, Normal cardiovascular exam Rhythm:Regular Rate:Normal     Neuro/Psych negative neurological ROS  negative psych ROS   GI/Hepatic negative GI ROS, Neg liver ROS,   Endo/Other  negative endocrine ROS  Renal/GU negative Renal ROS  negative genitourinary   Musculoskeletal negative musculoskeletal ROS (+)   Abdominal   Peds negative pediatric ROS (+)  Hematology negative hematology ROS (+)   Anesthesia Other Findings   Reproductive/Obstetrics negative OB ROS                             Anesthesia Physical Anesthesia Plan  ASA: II  Anesthesia Plan: General   Post-op Pain Management:    Induction: Intravenous  PONV Risk Score and Plan: 4 or greater and Ondansetron, Dexamethasone, Midazolam, Scopolamine patch - Pre-op and Treatment may vary due to age or medical condition  Airway Management Planned: Oral ETT  Additional Equipment:   Intra-op Plan:   Post-operative Plan: Extubation in OR  Informed Consent: I have reviewed the patients History and Physical, chart, labs and discussed the procedure including the risks, benefits and alternatives for the proposed anesthesia with the patient or authorized representative who has indicated his/her understanding and acceptance.   Dental advisory given  Plan Discussed with: CRNA and Surgeon  Anesthesia Plan Comments:         Anesthesia Quick Evaluation

## 2017-07-10 NOTE — Op Note (Signed)
Operative Note  CHRISTAIN MCRANEY  [id number]  [id number]  07/10/2017   Surgeon: Lady Deutscher ConnorMD  Assistant: Feliciana Rossetti MD  Procedure performed: laparoscopic sleeve gastrectomy, upper endoscopy  Preop diagnosis: Morbid obesity Body mass index is 44.16 kg/m. Post-op diagnosis/intraop findings: same  Specimens: fundus Retained items: none EBL: 30cc Complications: none  Description of procedure: After obtaining informed consent and administration of prophylactic lovenox in holding, the patient was taken to the operating room and placed supine on operating room table wheregeneral endotracheal anesthesia was initiated, preoperative antibiotics were administered, SCDs applied, and a formal timeout was performed. The abdomen was prepped and draped in usual sterile fashion. Peritoneal access was gained using a Visiport technique in the left upper quadrant and insufflation to 15 mmHg ensued without issue. Air was noted in the mesentery of the proximal small bowel from initial trocar placement. The transverse colon and proximal small bowel were closely inspected and found to be free of injury. The trocar entry point in the small bowel mesentery was inspected and was only involving one side of the mesentery. No bleeding was present. This was all reinspected at the end of the case and no injury confirmed. Under direct visualization three more 5 mm trochars were placed in the right and left hemiabdomen and the 15mm trocar in the right paramedian upper abdomen. Bilateral laparoscopic assisted TAPS blocks were performed with Exparel diluted with 0.25 percent Marcaine with epinephrine. The patient was placed in steep Trendelenburg and the liver retractor was introduced through an incision in the upper midline and secured to the post externally to maintain the left lobe retracted anteriorly. Using the Harmonic scalpel, the greater curvature of the stomach was dissected away from the greater omentum  and short gastric vessels were divided. This began 6 cm from the pylorus, and dissection proceeded until the left crus was clearly exposed. The fundus was very adherent to the spleen and a small amount of bleeding on the spleen was encountered which was addressed with harmonic, direct pressure and surgicel. This was reinspected at the end of the case and confirmed hemostatic. There were some filmy adhesions of the posterior stomach to the pancreas which were taken down the Harmonic. Esophageal fat pad was mobilized off the anterior stomach slightly. The 36 [place] VisiGi was then introduced and directed down towards the pylorus. This was placed to suction against the lesser curve. Serial fires of the linear cutting stapler with Peri-Strips were then employed to create our sleeve. The first fire used a green load and ensured adequate room at the angularis incisura. Two gold loads and then 2 blue loads were then employed to create a narrow tubular stomach all up to the angle of His. The excised stomach was then removed through our 15 mm trocar site within an Endo Catch bag. The visigi was taken off of suction and a few puffs of air were introduced, inflating the sleeve. No bubbles were observed and the irrigation fluid around the stomach and the shape was noted to be a nice smooth tube without any narrowing at the angularis. The visigi was then removed. Upper endoscopy was performed by the assistant surgeon and the sleeve was noted to be airtight, the staple line was hemostatic. Please see his separate note. The endoscope was removed. The 15 mm trocar site fascia in the right upper abdomen was closed with 2 interrupted sutures of 0 Vicryl using the laparoscopic suture passer under direct visualization. The liver retractor was removed under direct visualization. The  abdomen was then desufflated and all remaining trochars removed. The skin incisions were closed with running subcuticular Monocryl; benzoin, Steri-Strips  and Band-Aids were applied The patient was then awakened, extubated and taken to PACU in stable condition.    All counts were correct at the completion of the case.

## 2017-07-10 NOTE — Interval H&P Note (Signed)
History and Physical Interval Note:  07/10/2017 8:53 AM  Crystal FoersterFigen T Glomb  has presented today for surgery, with the diagnosis of MORBID OBESITY  The various methods of treatment have been discussed with the patient and family. After consideration of risks, benefits and other options for treatment, the patient has consented to  Procedure(s): LAPAROSCOPIC GASTRIC SLEEVE RESECTION WITH UPPER ENDO (N/A) as a surgical intervention .  The patient's history has been reviewed, patient examined, no change in status, stable for surgery.  I have reviewed the patient's chart and labs.  Questions were answered to the patient's satisfaction.     Cambry Spampinato Lollie SailsA Apurva Reily

## 2017-07-10 NOTE — Op Note (Signed)
Preoperative diagnosis: laparoscopic sleeve gastrectomy  Postoperative diagnosis: Same   Procedure: Upper endoscopy   Surgeon: [name], M.D.  Anesthesia: Gen.   Indications for procedure: This patient was undergoing a laparoscopic sleeve gastrectomy.   Description of procedure: The endoscopy was placed in the mouth and into the oropharynx and under endoscopic vision it was advanced to the esophagogastric junction. The pouch was insufflated and no bleeding or bubbles were seen. The GEJ was identified at 40cm from the teeth.  No bleeding or leaks were detected. The scope was withdrawn without difficulty.   Crystal [name], M.D. General, Bariatric, & Minimally Invasive Surgery [facility]    

## 2017-07-10 NOTE — Transfer of Care (Signed)
Immediate Anesthesia Transfer of Care Note  Patient: Crystal [name]  Procedure(s) Performed: LAPAROSCOPIC GASTRIC SLEEVE RESECTION WITH UPPER ENDO (N/A )  Patient Location: PACU  Anesthesia Type:General  Level of Consciousness: awake, alert , oriented and patient cooperative  Airway & Oxygen Therapy: Patient Spontanous Breathing and Patient connected to face mask oxygen  Post-op Assessment: Report given to RN, Post -op Vital signs reviewed and stable and Patient moving all extremities  Post vital signs: Reviewed and stable  Last Vitals:  Vitals:   07/10/17 0755  BP: 140/81  Pulse: 81  Resp: 16  Temp: 36.9 C  SpO2: 100%    Last Pain:  Vitals:   07/10/17 0755  TempSrc: Oral      Patients Stated Pain Goal: 3 (07/10/17 0803)  Complications: No apparent anesthesia complications

## 2017-07-11 LAB — CBC WITH DIFFERENTIAL/PLATELET
Basophils Absolute: 0 10*3/uL (ref 0.0–0.1)
Basophils Relative: 0 %
Eosinophils Absolute: 0 10*3/uL (ref 0.0–0.7)
Eosinophils Relative: 0 %
HCT: 37.5 % (ref 36.0–46.0)
Hemoglobin: 11.7 g/dL — ABNORMAL LOW (ref 12.0–15.0)
Lymphocytes Relative: 12 %
Lymphs Abs: 1.4 10*3/uL (ref 0.7–4.0)
MCH: 25.2 pg — ABNORMAL LOW (ref 26.0–34.0)
MCHC: 31.2 g/dL (ref 30.0–36.0)
MCV: 80.8 fL (ref 78.0–100.0)
Monocytes Absolute: 0.6 10*3/uL (ref 0.1–1.0)
Monocytes Relative: 5 %
Neutro Abs: 10 10*3/uL — ABNORMAL HIGH (ref 1.7–7.7)
Neutrophils Relative %: 83 %
Platelets: 323 10*3/uL (ref 150–400)
RBC: 4.64 MIL/uL (ref 3.87–5.11)
RDW: 13.7 % (ref 11.5–15.5)
WBC: 12 10*3/uL — ABNORMAL HIGH (ref 4.0–10.5)

## 2017-07-11 LAB — COMPREHENSIVE METABOLIC PANEL
ALT: 26 U/L (ref 14–54)
AST: 27 U/L (ref 15–41)
Albumin: 3.9 g/dL (ref 3.5–5.0)
Alkaline Phosphatase: 44 U/L (ref 38–126)
Anion gap: 9 (ref 5–15)
BUN: 9 mg/dL (ref 6–20)
CO2: 22 mmol/L (ref 22–32)
Calcium: 8.8 mg/dL — ABNORMAL LOW (ref 8.9–10.3)
Chloride: 105 mmol/L (ref 101–111)
Creatinine, Ser: 0.8 mg/dL (ref 0.44–1.00)
GFR calc Af Amer: >60 mL/min (ref >60)
GFR calc non Af Amer: >60 mL/min (ref >60)
Glucose, Bld: 91 mg/dL (ref 65–99)
Potassium: 3.9 mmol/L (ref 3.5–5.1)
Sodium: 136 mmol/L (ref 135–145)
Total Bilirubin: 0.8 mg/dL (ref 0.3–1.2)
Total Protein: 7.8 g/dL (ref 6.5–8.1)

## 2017-07-11 MED ORDER — ONDANSETRON 4 MG PO TBDP: 4.0000 mg | ORAL_TABLET | Freq: Three times a day (TID) | ORAL | 0 refills | Status: DC | PRN

## 2017-07-11 MED ORDER — ESOMEPRAZOLE MAGNESIUM 40 MG PO CPDR: 40.0000 mg | DELAYED_RELEASE_CAPSULE | Freq: Every day | ORAL | 1 refills | Status: DC

## 2017-07-11 MED ORDER — ACETAMINOPHEN 160 MG/5ML PO SOLN: 650.0000 mg | ORAL | 0 refills | Status: DC | PRN

## 2017-07-11 MED ORDER — OXYCODONE HCL 5 MG/5ML PO SOLN: 5.0000 mg | ORAL | 0 refills | Status: DC | PRN

## 2017-07-11 NOTE — Progress Notes (Signed)
S: Slept OK. Pain well controlled. No nausea or dysphagia, some cramping with PO intake. Walking halls.   Vitals, labs, intake/output, and orders reviewed at this time. Tmax 98.8, HR 64, normotensive (hypertensive yesterday). Sat 100% on room air. CMP normal. WBC 12 (8 preop), Hgb 11.7 (stable). PO intake 420 recorded; UOP 2200  Gen: A&Ox3, no distress  H&N: EOMI, atraumatic, neck supple Chest: unlabored respirations, RRR Abd: soft, appropriately minimally tender at incisions, nondistended, incisions c/d/i with steris/bandaids Ext: warm, no edema Neuro: grossly normal  Lines/tubes/drains: PIV  A/P:  POD 1 s/p sleeve gastrectomy, doing well -Bariatric clears and protein shakes as tolerated -Ambulate halld -Pulmonary toilet  Discharge later today.    Phylliss Blakeshelsea Karlos Scadden, MD [facility] [place] Pager [phone]

## 2017-07-11 NOTE — Progress Notes (Signed)
Patient alert and oriented, pain is controlled. Patient is tolerating fluids, advanced to protein shake today, patient is tolerating well.  Reviewed Gastric sleeve discharge instructions with patient and patient is able to articulate understanding.  Provided information on BELT program, Support Group and [facility] outpatient pharmacy. All questions answered, will continue to monitor.  

## 2017-07-11 NOTE — Progress Notes (Signed)
Patient alert and oriented, Post op day 1.  Provided support and encouragement.  Encouraged pulmonary toilet, ambulation and small sips of liquids.  All questions answered.  Will continue to monitor. 

## 2017-07-11 NOTE — Plan of Care (Signed)
Problem: Food- and Nutrition-Related Knowledge Deficit (NB-1.1) Goal: Nutrition education Formal process to instruct or train a patient/client in a skill or to impart knowledge to help patients/clients voluntarily manage or modify food choices and eating behavior to maintain or improve health. Outcome: Completed/Met Date Met: 07/11/17 Nutrition Education Note  Received consult for diet education per DROP protocol.   Discussed 2 week post op diet with pt. Emphasized that liquids must be non carbonated, non caffeinated, and sugar free. Fluid goals discussed. Pt to follow up with outpatient bariatric RD for further diet progression after 2 weeks. Multivitamins and minerals also reviewed. Teach back method used, pt expressed understanding, expect good compliance.   Diet: First 2 Weeks  You will see the nutritionist about two (2) weeks after your surgery. The nutritionist will increase the types of foods you can eat if you are handling liquids well:  If you have severe vomiting or nausea and cannot handle clear liquids lasting longer than 1 day, call your surgeon  Protein Shake  Drink at least 2 ounces of shake 5-6 times per day  Each serving of protein shakes (usually 8 - 12 ounces) should have a minimum of:  15 grams of protein  And no more than 5 grams of carbohydrate  Goal for protein each day:  Men = 80 grams per day  Women = 60 grams per day  Protein powder may be added to fluids such as non-fat milk or Lactaid milk or Soy milk (limit to 35 grams added protein powder per serving)   Hydration  Slowly increase the amount of water and other clear liquids as tolerated (See Acceptable Fluids)  Slowly increase the amount of protein shake as tolerated  Sip fluids slowly and throughout the day  May use sugar substitutes in small amounts (no more than 6 - 8 packets per day; i.e. Splenda)   Fluid Goal  The first goal is to drink at least 8 ounces of protein shake/drink per day (or as directed  by the nutritionist); some examples of protein shakes are Premier Protein, [employer], [employer], EAS Edge HP, and Unjury. See handout from pre-op Bariatric Education Class:  Slowly increase the amount of protein shake you drink as tolerated  You may find it easier to slowly sip shakes throughout the day  It is important to get your proteins in first  Your fluid goal is to drink 64 - 100 ounces of fluid daily  It may take a few weeks to build up to this  32 oz (or more) should be clear liquids  And  32 oz (or more) should be full liquids (see below for examples)  Liquids should not contain sugar, caffeine, or carbonation   Clear Liquids:  Water or Sugar-free flavored water (i.e. Fruit H2O, Propel)  Decaffeinated coffee or tea (sugar-free)  Crystal Lite, Wyler's Lite, Minute Maid Lite  Sugar-free Jell-O  Bouillon or broth  Sugar-free Popsicle: *Less than 20 calories each; Limit 1 per day   Full Liquids:  Protein Shakes/Drinks + 2 choices per day of other full liquids  Full liquids must be:  No More Than 12 grams of Carbs per serving  No More Than 3 grams of Fat per serving  Strained low-fat cream soup  Non-Fat milk  Fat-free Lactaid Milk  Sugar-free yogurt (Dannon Lite & Fit, [place] yogurt, Oikos Zero)   Crystal [name] RD, LDN Clinical Nutrition Pager # - [phone]

## 2017-07-11 NOTE — Discharge Summary (Signed)
Physician Discharge Summary  Crystal [name]:[id number] DOB: 15-May-1986 DOA: 07/10/2017  PCP: Rogue Bussing, MD  Admit date: 07/10/2017 Discharge date: 07/11/2017  Recommendations for Outpatient Follow-up:  1. F/u with PCP within 1-2 weeks   Follow-up Information    [name], MD. Go on 07/25/2017.   Specialty:  General Surgery Why:  at 330pm Contact information: [phone]        [name], MD Follow up.   Specialty:  General Surgery Contact information: [phone]          Discharge Diagnoses:  Active Problems:   Morbid obesity ([place])   Surgical Procedure: Laparoscopic Sleeve Gastrectomy, upper endoscopy  Discharge Condition: Good Disposition: Home  Diet recommendation: Postoperative sleeve gastrectomy diet (liquids only)  Filed Weights   07/10/17 0803  Weight: 124.1 kg (273 lb 9.6 oz)     Hospital Course:  The patient was admitted for a planned laparoscopic sleeve gastrectomy. Please see operative note. Preoperatively the patient was given lovenox for DVT prophylaxis. Postoperative prophylactic Lovenox dosing was started on the morning of post op day 1. ERAS protocol was used. On the evening of postoperative day 0, the patient was started on water and ice chips. On postoperative day 1 the patient had no fever or tachycardia and was tolerating water in their diet was gradually advanced throughout the day. The patient was ambulating without difficulty. Their vital signs are stable without fever or tachycardia. Their hemoglobin had remained stable. The patient had received discharge instructions and counseling. They were deemed stable for discharge and had met discharge criteria   Discharge Instructions  Discharge Instructions    Ambulate hourly while awake    Complete by:  As directed    Call MD for:  difficulty breathing, headache or visual disturbances    Complete by:  As directed    Call MD for:  persistant dizziness or  light-headedness    Complete by:  As directed    Call MD for:  persistant nausea and vomiting    Complete by:  As directed    Call MD for:  redness, tenderness, or signs of infection (pain, swelling, redness, odor or green/yellow discharge around incision site)    Complete by:  As directed    Call MD for:  severe uncontrolled pain    Complete by:  As directed    Call MD for:  temperature >101 F    Complete by:  As directed    Discharge wound care:    Complete by:  As directed    Remove bandaids on 11/1. Steri strips will peel off after about 1 week. Shower over steri strips and pat dry.   Incentive spirometry    Complete by:  As directed    Perform hourly while awake     Allergies as of 07/11/2017   No Known Allergies     Medication List    TAKE these medications   acetaminophen 160 MG/5ML solution Commonly known as:  TYLENOL Take 20.3 mLs (650 mg total) by mouth every 4 (four) hours as needed for mild pain or headache (please give tylenol before offering narcotics).   desogestrel-ethinyl estradiol 0.15-30 MG-MCG tablet Commonly known as:  APRI,EMOQUETTE,SOLIA Take 1 tablet by mouth daily.   esomeprazole 40 MG capsule Commonly known as:  NEXIUM Take 1 capsule (40 mg total) by mouth daily.   ondansetron 4 MG disintegrating tablet Commonly known as:  ZOFRAN ODT Take 1 tablet (4 mg total) by mouth every 8 (eight)  hours as needed for nausea or vomiting.   oxyCODONE 5 MG/5ML solution Commonly known as:  ROXICODONE Take 5-10 mLs (5-10 mg total) by mouth every 4 (four) hours as needed for moderate pain or severe pain (Please offer tylenol first).            Discharge Care Instructions        Start     Ordered   07/11/17 0000  Discharge wound care:    Comments:  Remove bandaids on 11/1. Steri strips will peel off after about 1 week. Shower over steri strips and pat dry.   07/11/17 0840     Follow-up Information    [name], MD. Go on 07/25/2017.    Specialty:  General Surgery Why:  at 330pm Contact information: [phone]        [name], MD Follow up.   Specialty:  General Surgery Contact information: [phone]            The results of significant diagnostics from this hospitalization (including imaging, microbiology, ancillary and laboratory) are listed below for reference.    Significant Diagnostic Studies: No results found.  Labs: Basic Metabolic Panel:  Recent Labs Lab 07/11/17 0545  NA 136  K 3.9  CL 105  CO2 22  GLUCOSE 91  BUN 9  CREATININE 0.80  CALCIUM 8.8*   Liver Function Tests:  Recent Labs Lab 07/11/17 0545  AST 27  ALT 26  ALKPHOS 44  BILITOT 0.8  PROT 7.8  ALBUMIN 3.9    CBC:  Recent Labs Lab 07/11/17 0545  WBC 12.0*  NEUTROABS 10.0*  HGB 11.7*  HCT 37.5  MCV 80.8  PLT 323    CBG: No results for input(s): GLUCAP in the last 168 hours.  Active Problems:   Morbid obesity [facility])    Signed:  [facility], [place] [phone] 07/11/2017, 8:41 AM

## 2017-07-11 NOTE — Anesthesia Postprocedure Evaluation (Signed)
Anesthesia Post Note  Patient: Crystal [name]  Procedure(s) Performed: LAPAROSCOPIC GASTRIC SLEEVE RESECTION WITH UPPER ENDO (N/A )     Patient location during evaluation: PACU Anesthesia Type: General Level of consciousness: awake and alert Pain management: pain level controlled Vital Signs Assessment: post-procedure vital signs reviewed and stable Respiratory status: spontaneous breathing, nonlabored ventilation, respiratory function stable and patient connected to nasal cannula oxygen Cardiovascular status: blood pressure returned to baseline and stable Postop Assessment: no apparent nausea or vomiting Anesthetic complications: no    Last Vitals:  Vitals:   07/11/17 0202 07/11/17 0523  BP: (!) 152/87 137/74  Pulse: 68 64  Resp: 17 19  Temp: 36.7 C 36.8 C  SpO2: 100% 100%    Last Pain:  Vitals:   07/11/17 0542  TempSrc:   PainSc: 8                  Myron Lona S

## 2017-07-11 NOTE — Progress Notes (Signed)
Patient seen by bariatric coordinator and dietitian.  Patient given d/c instructions and expresses understanding.

## 2017-07-16 ENCOUNTER — Telehealth (HOSPITAL_COMMUNITY): Payer: Self-pay

## 2017-07-16 NOTE — Telephone Encounter (Signed)
Follow up with bariatric surgical patient to discuss post discharge questions.  No answer at this time voicemail left for patient along with contact information to discuss the following questions.  1.  Are you having any pain not relieved by pain medication?I was out of medicine talked to office and told me I could take Tylenol as well.  Pain is well controlled at this time  2.  How much fluid total fluid intake have you had in the last 24/48 hours?  Patient has been getting in 39-49 ounces of fluid daily between water and protein shakes  3.  How much protein intake have you had in the last 24/48 hours?45 grams or 1.5 shakes  4.  Have you had any trouble making urine?no problems making urine  5.  Have you had nausea that has not been relieved by nausea medication?yes, she was not taking protonix and had complaints of reflux, we discussed that she is to take the protonix every day until medication is complete or until the physician thinks it is appropriate to discontinue  6.  Are you ambulating every hour?yes  7.  Are you passing gas or had a BM?yes on 11-2  8.  Do you know how to contact [facility]? [facility]? [facility]?yes  9.  Are you taking your vitamins and calcium without difficulty?patient had taken samples given by [facility], she had purchased  Vitamin patches for after sugery.  Discussed with patient that RD's of this program do not recommend patches.  Patient ordered Bariatric chewable multivitamins await arrival, but using patches until multi arrives.  10. Tell me how your incision looks?  Any redness, open incision, or drainage? Incisions look good

## 2017-07-24 ENCOUNTER — Ambulatory Visit: Payer: 59 | Admitting: Registered"

## 2017-10-28 DIAGNOSIS — R1084 Generalized abdominal pain: Secondary | ICD-10-CM | POA: Diagnosis not present

## 2017-10-28 DIAGNOSIS — N926 Irregular menstruation, unspecified: Secondary | ICD-10-CM | POA: Diagnosis not present

## 2017-11-21 ENCOUNTER — Other Ambulatory Visit (HOSPITAL_COMMUNITY)
Admission: RE | Admit: 2017-11-21 | Discharge: 2017-11-21 | Disposition: A | Discharge status: Home / Self Care | Payer: 59 | Source: Ambulatory Visit | Attending: Family Medicine | Admitting: Family Medicine

## 2017-11-21 ENCOUNTER — Ambulatory Visit (INDEPENDENT_AMBULATORY_CARE_PROVIDER_SITE_OTHER): Payer: 59 | Admitting: Internal Medicine

## 2017-11-21 ENCOUNTER — Encounter: Payer: Self-pay | Admitting: Internal Medicine

## 2017-11-21 VITALS — BP 102/70 | HR 72 | Temp 97.9°F | Ht 67.0 in | Wt 228.6 lb

## 2017-11-21 DIAGNOSIS — Z01419 Encounter for gynecological examination (general) (routine) without abnormal findings: Secondary | ICD-10-CM | POA: Insufficient documentation

## 2017-11-21 DIAGNOSIS — E669 Obesity, unspecified: Secondary | ICD-10-CM | POA: Insufficient documentation

## 2017-11-21 DIAGNOSIS — Z6835 Body mass index (BMI) 35.0-35.9, adult: Secondary | ICD-10-CM | POA: Insufficient documentation

## 2017-11-21 DIAGNOSIS — D649 Anemia, unspecified: Secondary | ICD-10-CM

## 2017-11-21 DIAGNOSIS — Z30011 Encounter for initial prescription of contraceptive pills: Secondary | ICD-10-CM

## 2017-11-21 DIAGNOSIS — Z9884 Bariatric surgery status: Secondary | ICD-10-CM | POA: Diagnosis not present

## 2017-11-21 DIAGNOSIS — Z01411 Encounter for gynecological examination (general) (routine) with abnormal findings: Secondary | ICD-10-CM

## 2017-11-21 LAB — POCT WET PREP (WET MOUNT)
Clue Cells Wet Prep Whiff POC: NEGATIVE
Trichomonas Wet Prep HPF POC: ABSENT

## 2017-11-21 NOTE — Patient Instructions (Signed)
[name],  Thank you for coming in today.  I will call with lab results.   Please ask your surgeon about vitamin supplementation and medication for reflux, as they likely would like you to continue this.  Best, Dr. Sampson GoonFitzgerald

## 2017-11-21 NOTE — Progress Notes (Signed)
PAP

## 2017-11-22 LAB — CBC
Hematocrit: 36.5 % (ref 34.0–46.6)
Hemoglobin: 11.5 g/dL (ref 11.1–15.9)
MCH: 25.2 pg — ABNORMAL LOW (ref 26.6–33.0)
MCHC: 31.5 g/dL (ref 31.5–35.7)
MCV: 80 fL (ref 79–97)
Platelets: 340 10*3/uL (ref 150–379)
RBC: 4.57 x10E6/uL (ref 3.77–5.28)
RDW: 14.5 % (ref 12.3–15.4)
WBC: 7.3 10*3/uL (ref 3.4–10.8)

## 2017-11-22 LAB — COMPREHENSIVE METABOLIC PANEL
ALT: 12 IU/L (ref 0–32)
AST: 14 IU/L (ref 0–40)
Albumin/Globulin Ratio: 1.7 (ref 1.2–2.2)
Albumin: 4.3 g/dL (ref 3.5–5.5)
Alkaline Phosphatase: 56 IU/L (ref 39–117)
BUN/Creatinine Ratio: 9 (ref 9–23)
BUN: 7 mg/dL (ref 6–20)
Bilirubin Total: 0.3 mg/dL (ref 0.0–1.2)
CO2: 24 mmol/L (ref 20–29)
Calcium: 9.4 mg/dL (ref 8.7–10.2)
Chloride: 105 mmol/L (ref 96–106)
Creatinine, Ser: 0.75 mg/dL (ref 0.57–1.00)
GFR calc Af Amer: 124 mL/min/{1.73_m2} (ref >59)
GFR calc non Af Amer: 107 mL/min/{1.73_m2} (ref >59)
Globulin, Total: 2.6 g/dL (ref 1.5–4.5)
Glucose: 78 mg/dL (ref 65–99)
Potassium: 3.8 mmol/L (ref 3.5–5.2)
Sodium: 141 mmol/L (ref 134–144)
Total Protein: 6.9 g/dL (ref 6.0–8.5)

## 2017-11-22 LAB — LIPID PANEL
Chol/HDL Ratio: 2.7 ratio (ref 0.0–4.4)
Cholesterol, Total: 167 mg/dL (ref 100–199)
HDL: 63 mg/dL (ref >39)
LDL Calculated: 93 mg/dL (ref 0–99)
Triglycerides: 53 mg/dL (ref 0–149)
VLDL Cholesterol Cal: 11 mg/dL (ref 5–40)

## 2017-11-22 MED ORDER — DESOGESTREL-ETHINYL ESTRADIOL 0.15-30 MG-MCG PO TABS: 1.0000 | ORAL_TABLET | Freq: Every day | ORAL | 11 refills | Status: DC

## 2017-11-23 ENCOUNTER — Encounter: Payer: Self-pay | Admitting: Internal Medicine

## 2017-11-23 DIAGNOSIS — Z01419 Encounter for gynecological examination (general) (routine) without abnormal findings: Secondary | ICD-10-CM | POA: Insufficient documentation

## 2017-11-23 DIAGNOSIS — Z9884 Bariatric surgery status: Secondary | ICD-10-CM | POA: Insufficient documentation

## 2017-11-23 LAB — CERVICOVAGINAL ANCILLARY ONLY
Chlamydia: NEGATIVE
Neisseria Gonorrhea: NEGATIVE

## 2017-11-23 NOTE — Assessment & Plan Note (Signed)
-   Ordered CBC, CMP, and lipid panel.  - Counseled patient to review what supplements her surgeon would like her to be taking and inquire about PPI, as suspect she should still be taking this.

## 2017-11-23 NOTE — Progress Notes (Signed)
[facility] Progress Note  Subjective:  Crystal [name] is a 32 y.o. female with history of obesity s/p gastric sleeve surgery last October, PCOS, and iron deficiency anemia who presents for check-up and pap smear.   Concerns: Would like STD testing. She has had same female partner for years and no changes in vaginal discharge but would like to be sure. Taking OCP for birth control and having regular 7-day periods.   HM: Due for pap smear 12/2017. Has not had recent lipid panel testing or CBC since surgery.   S/p gastric sleeve surgery: - Has not followed up with her surgeon yet but has appointment at the end of this month. Is taking an OTC vitamin but no PPI. Denies hair loss, skin changes. Feels well and is eating solids now. Has some trouble swallowing larger pills, however. Misses being able to eat larger meals but thinks she is coping pretty well. 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   ROS: No chest pain, no abdominal pain, no dark stools, no dysuria  No Known Allergies  Social History   Tobacco Use  . Smoking status: Former Smoker    Types: Cigarettes  . Smokeless tobacco: Never Used  . Tobacco comment: occ smoker not regular quit 2017  Substance Use Topics  . Alcohol use: No    Objective: Blood pressure 102/70, pulse 72, temperature 97.9 F (36.6 C), temperature source Oral, height 5\' 7"  (1.702 m), weight 228 lb 9.6 oz (103.7 kg), SpO2 100 %, unknown if currently breastfeeding. Body mass index is 35.8 kg/m. Constitutional: Pleasant, well-appearing female in NAD HENT: MMM, normal posterior oropharynx  Cardiovascular: RRR, S1, S2, no m/r/g.  Pulmonary/Chest: Effort normal and breath sounds normal.  Abdominal: Soft. +BS, NT Musculoskeletal: No LE edema Neurological: AOx3, no focal deficits. GU: Chaperone present. No lesions or discharge noted on speculum exam. No  cervical or adnexal tenderness with bimanual exam.  Skin: Skin is warm and dry. No rash noted. Raised nevus of left buttock (patient says this is a birth mark) Psychiatric: Normal mood and affect.  Vitals reviewed  Assessment/Plan: Women's annual routine gynecological examination - Obtained pap smear, gc/chlaymdia testing, wet prep. Patient declines HIV, RPR.  - Refilled OCPs. Patient had gastric sleeve not bypass, so absorption in intestinal tract should not be affected.   History of bariatric surgery - Ordered CBC, CMP, and lipid panel.  - Counseled patient to review what supplements her surgeon would like her to be taking and inquire about PPI, as suspect she should still be taking this.   Follow-up prn.  Dani GobbleHillary Malekai Markwood, MD [facility], PGY-3

## 2017-11-23 NOTE — Assessment & Plan Note (Addendum)
-   Obtained pap smear, gc/chlaymdia testing, wet prep. Patient declines HIV, RPR.  - Refilled OCPs. Patient had gastric sleeve not bypass, so absorption in intestinal tract should not be affected.

## 2017-11-26 LAB — CYTOLOGY - PAP
Adequacy: ABSENT
Diagnosis: NEGATIVE
HPV: NOT DETECTED

## 2018-03-12 IMAGING — RF DG UGI W/ KUB
8 of 9 series · 12 of 19 positions shown · non-contrast
Comparison: None.

CLINICAL DATA: Preoperative exam for gastric sleeve surgery.

EXAM:
UPPER GI SERIES WITH KUB
TECHNIQUE: After obtaining a scout radiograph a routine upper GI series was
performed using thin and high density barium.
FLUOROSCOPY TIME:  Fluoroscopy Time:  0.7 minutes
Radiation Exposure Index (if provided by the fluoroscopic device):
22 mGy
Number of Acquired Spot Images: 2

[Series 1: t abdomen supine · 0.15mm/px · 1 of 1 slices shown]
[im 1/1]
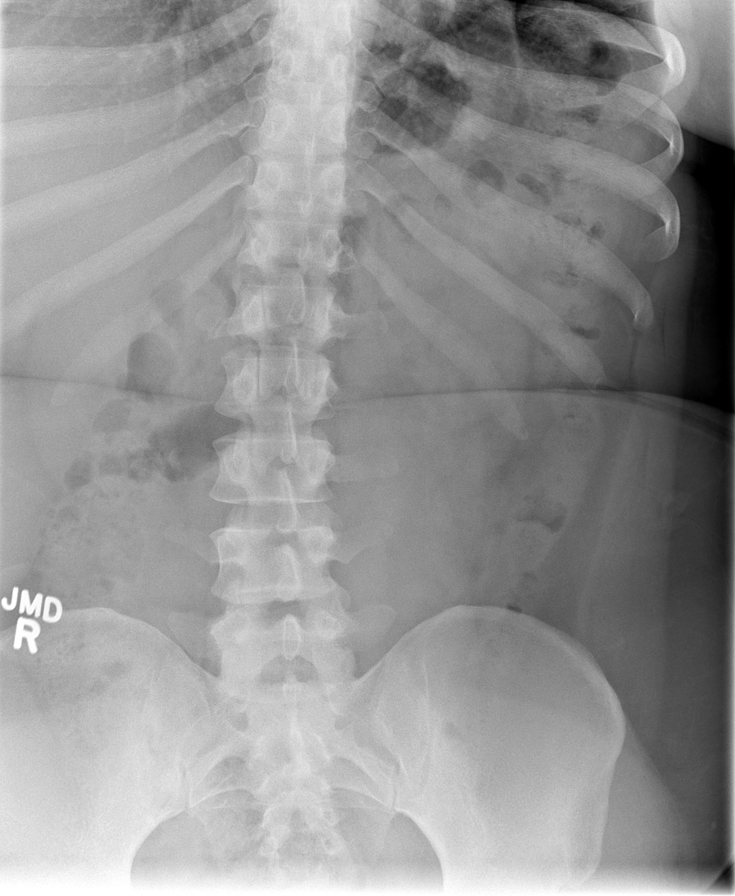

[Series 2: cp_standard · 0.52mm/px · 2 of 55 frames shown (1 of 5)]
[frame 9/55]
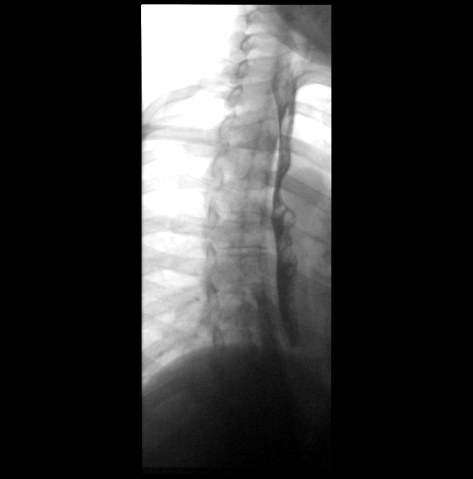
[frame 28/55]
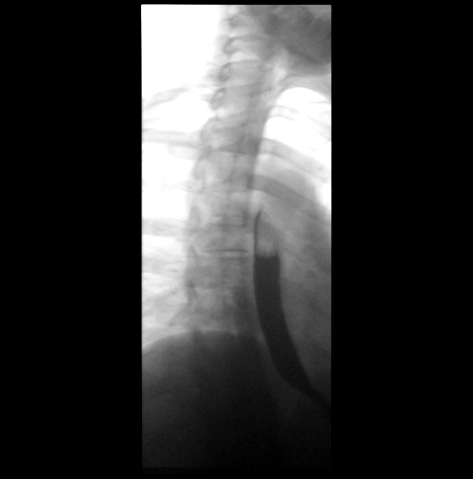

[Series 3: cp_standard · 0.52mm/px · 3 of 50 frames shown (2 of 5)]
[frame 8/50]
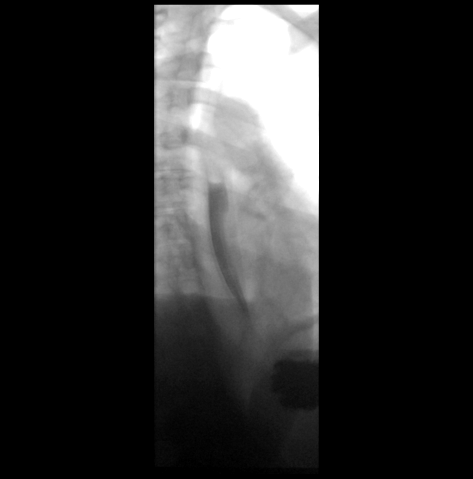
[frame 26/50]
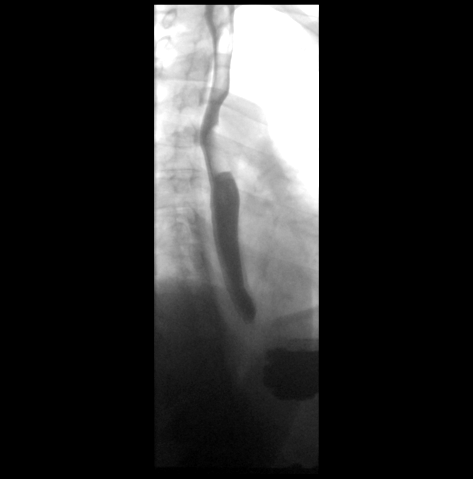
[frame 43/50]
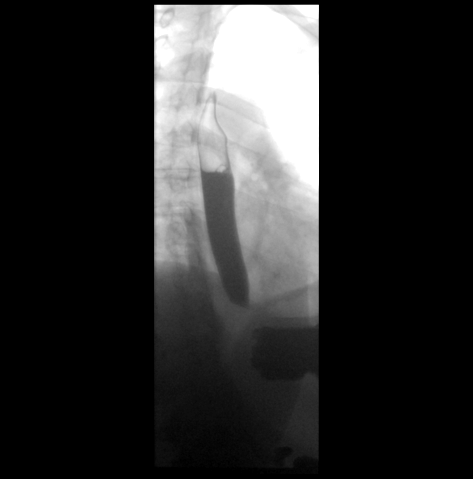

[Series 4: cp_standard · 0.52mm/px · 2 of 30 frames shown (3 of 5)]
[frame 14/30]
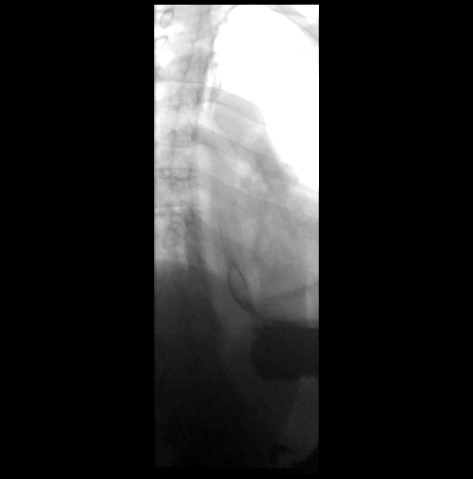
[frame 16/30]
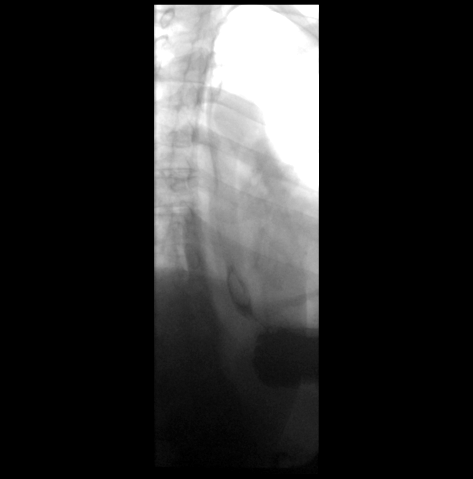

[Series 5: cp_standard · 0.26mm/px · 1 of 1 slices shown (4 of 5)]
[im 1/1]
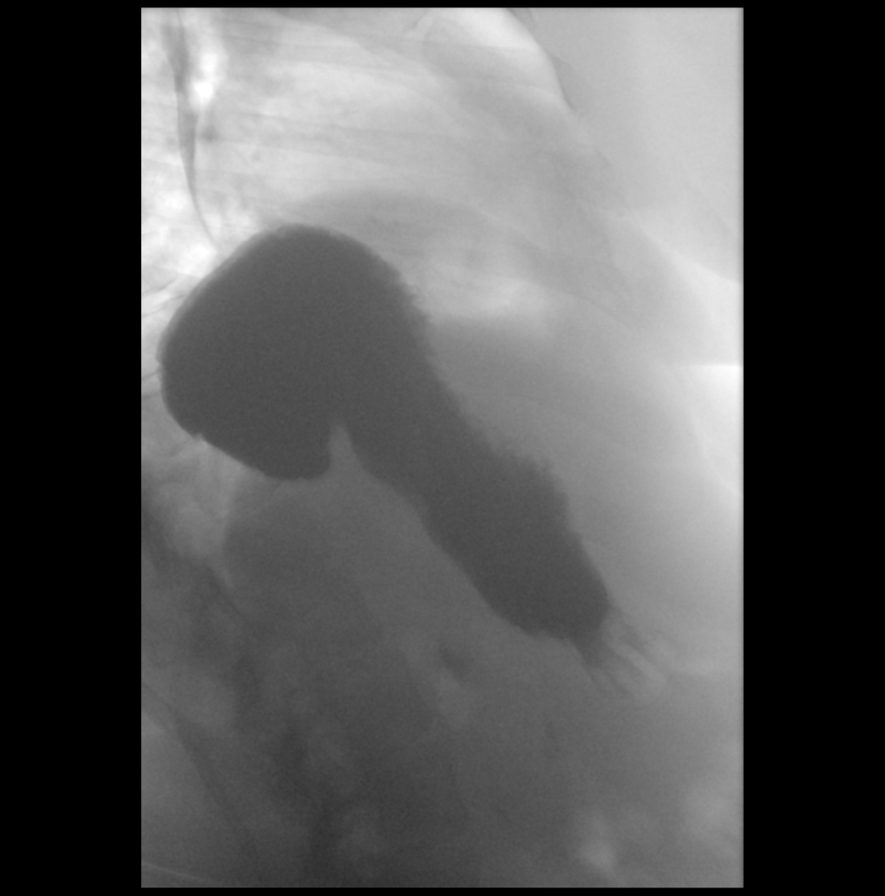

[Series 7: fluoro_barium 2fps_bw · 0.18mm/px · 1 of 1 slices shown (1 of 2)]
[im 1/1]
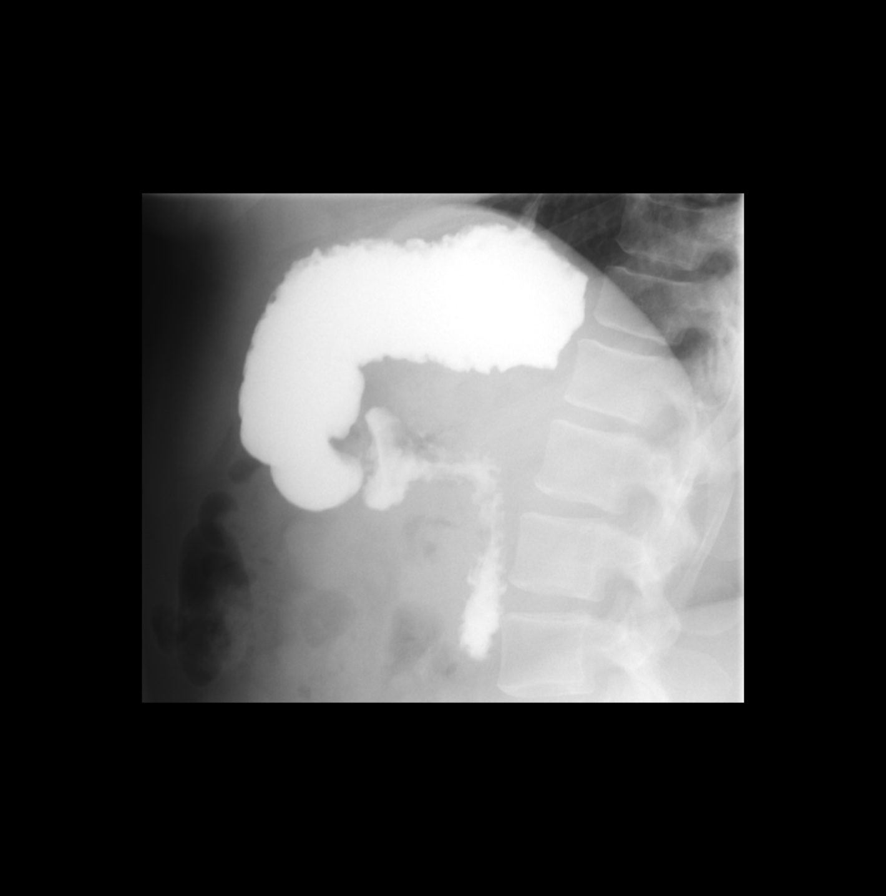

[Series 8: cp_standard · 0.27mm/px · 1 of 1 slices shown (5 of 5)]
[im 1/1]
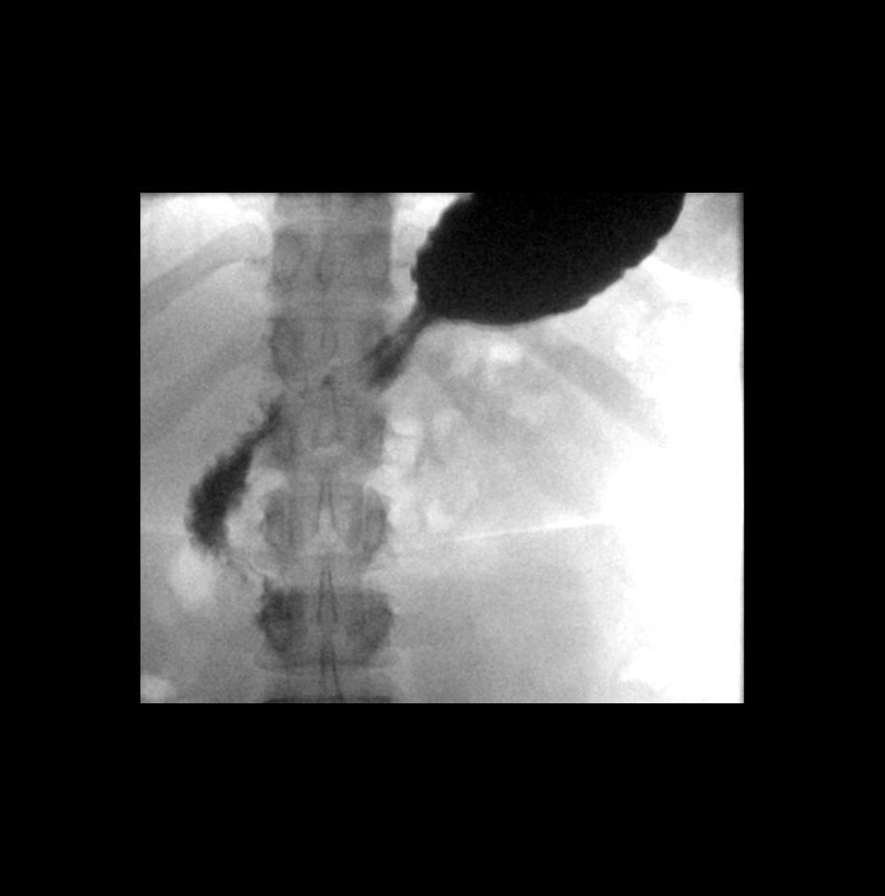

[Series 9: fluoro_barium 2fps_bw · 0.17mm/px · 1 of 2 frames shown (2 of 2)]
[frame 2/2]
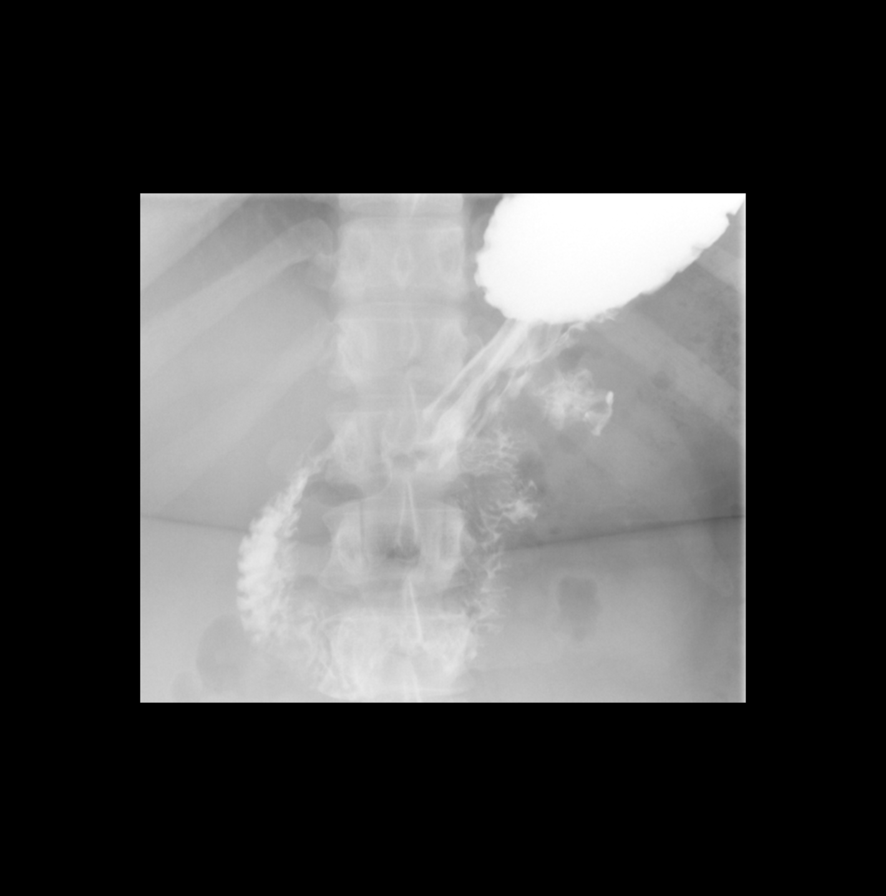

[12 of 19 positions shown; findings below may reference images not displayed]

FINDINGS: Preliminary abdominal x-ray demonstrates nonobstructive bowel gas
pattern.

Primary peristaltic waves in the esophagus were normal. No
esophageal stricture, ulceration, or other significant abnormality.
No gastroesophageal reflux was noted during the course of the
examination.

No hiatal hernia. Gastric morphology and appearance appear normal.
Duodenum bulb and C-loop are within normal limits. Duodenum mucosal
pattern within normal limits.
IMPRESSION: Normal single-contrast upper GI.

## 2018-06-14 ENCOUNTER — Encounter (HOSPITAL_COMMUNITY): Payer: Self-pay

## 2018-06-14 ENCOUNTER — Ambulatory Visit (HOSPITAL_COMMUNITY)
Admission: EM | Admit: 2018-06-14 | Discharge: 2018-06-14 | Disposition: A | Discharge status: Home / Self Care | Payer: 59 | Attending: Family Medicine | Admitting: Family Medicine

## 2018-06-14 DIAGNOSIS — E282 Polycystic ovarian syndrome: Secondary | ICD-10-CM | POA: Diagnosis not present

## 2018-06-14 DIAGNOSIS — Z833 Family history of diabetes mellitus: Secondary | ICD-10-CM | POA: Insufficient documentation

## 2018-06-14 DIAGNOSIS — Z8249 Family history of ischemic heart disease and other diseases of the circulatory system: Secondary | ICD-10-CM | POA: Diagnosis not present

## 2018-06-14 DIAGNOSIS — N898 Other specified noninflammatory disorders of vagina: Secondary | ICD-10-CM | POA: Diagnosis present

## 2018-06-14 DIAGNOSIS — N760 Acute vaginitis: Secondary | ICD-10-CM | POA: Insufficient documentation

## 2018-06-14 DIAGNOSIS — Z825 Family history of asthma and other chronic lower respiratory diseases: Secondary | ICD-10-CM | POA: Diagnosis not present

## 2018-06-14 DIAGNOSIS — Z87891 Personal history of nicotine dependence: Secondary | ICD-10-CM | POA: Diagnosis not present

## 2018-06-14 DIAGNOSIS — Z9884 Bariatric surgery status: Secondary | ICD-10-CM | POA: Diagnosis not present

## 2018-06-14 LAB — POCT URINALYSIS DIP (DEVICE)
Bilirubin Urine: NEGATIVE
Glucose, UA: NEGATIVE mg/dL
Hgb urine dipstick: NEGATIVE
Ketones, ur: NEGATIVE mg/dL
Leukocytes, UA: NEGATIVE
Nitrite: NEGATIVE
Protein, ur: NEGATIVE mg/dL
Specific Gravity, Urine: 1.02 (ref 1.005–1.030)
Urobilinogen, UA: 1 mg/dL (ref 0.0–1.0)
pH: 7.5 (ref 5.0–8.0)

## 2018-06-14 LAB — POCT PREGNANCY, URINE: Preg Test, Ur: NEGATIVE

## 2018-06-14 MED ORDER — METRONIDAZOLE 0.75 % VA GEL: 1.0000 | Freq: Two times a day (BID) | VAGINAL | 0 refills | Status: DC

## 2018-06-14 MED ORDER — FLUCONAZOLE 150 MG PO TABS: 150.0000 mg | ORAL_TABLET | Freq: Every day | ORAL | 0 refills | Status: DC

## 2018-06-14 NOTE — ED Provider Notes (Signed)
[facility]    CSN: [id number] Arrival date & time: 06/14/18  6962     History   Chief Complaint Chief Complaint  Patient presents with  . Vaginal Discharge    HPI Crystal [name] is a 32 y.o. female.   She is a 32 year old female presents with thick white milky vaginal discharge, itching, irritation x1 week.  Symptoms have been constant and worsening.  She denies any vaginal bleeding.  Her last menstrual  was 05/17/2018.  She denies any associated abdominal pain, pelvic pain, back pain, dysuria, hematuria, urinary frequency.  She is currently sexually active with one partner unprotected.  He does not use any birth control.  She does have a regular OB/GYN that she sees for annual checkups.      Past Medical History:  Diagnosis Date  . Allergy   . Anemia   . Anxiety   . Depression   . H/O varicella   . History of PID    pt denies  . Hx of chlamydia infection   . Hypertension    PIH 2013 elevayed bp march 2018 no bp given due to stress  . Irregular menses   . Obese   . PCOS (polycystic ovarian syndrome)    No radiological confirmation  . Postpartum care following cesarean delivery 08/19/2012  . Status post primary low transverse cesarean section - 12/7 08/17/2012   Presumed macrosomia - FT progress with NRFT     Patient Active Problem List   Diagnosis Date Noted  . Women's annual routine gynecological examination 11/23/2017  . History of bariatric surgery 11/23/2017  . Morbid obesity ([facility]) 07/10/2017  . Urticaria 04/04/2017  . Vaginal discharge 11/19/2016  . Mood disorder ([facility]) 11/19/2016  . Weight loss counseling, encounter for 05/03/2016  . Obesity 07/02/2015  . Anemia, iron deficiency 05/06/2015  . Lumbar back pain 09/16/2014  . ALLERGIC RHINITIS 09/30/2010  . POLYCYSTIC OVARIAN DISEASE 09/24/2009    Past Surgical History:  Procedure Laterality Date  . CESAREAN SECTION  08/18/2012   Procedure: CESAREAN SECTION;x 1  Surgeon: Tresa Endo A. Ernestina Penna, MD;   Location: [facility];  Service: Obstetrics;  Laterality: N/A;  Primary Cesarean Section   . CESAREAN SECTION N/A 05/21/2015   Procedure: CESAREAN SECTION;  Surgeon: Tereso Newcomer, MD;  Location: [facility];  Service: Obstetrics;  Laterality: N/A;  Requested 05-21-15 @ 1:15p  . LAPAROSCOPIC GASTRIC SLEEVE RESECTION N/A 07/10/2017   Procedure: LAPAROSCOPIC GASTRIC SLEEVE RESECTION WITH UPPER ENDO;  Surgeon: Berna Bue, MD;  Location: [facility];  Service: General;  Laterality: N/A;  . ROOT CANAL      OB History    Gravida  3   Para  2   Term  2   Preterm  0   AB  1   Living  2     SAB  1   TAB      Ectopic      Multiple  0   Live Births  2            Home Medications    Prior to Admission medications   Medication Sig Start Date End Date Taking? Authorizing Provider  acetaminophen (TYLENOL) 160 MG/5ML solution Take 20.3 mLs (650 mg total) by mouth every 4 (four) hours as needed for mild pain or headache (please give tylenol before offering narcotics). 07/11/17   Berna Bue, MD  desogestrel-ethinyl estradiol (APRI,EMOQUETTE,SOLIA) 0.15-30 MG-MCG tablet Take 1 tablet by mouth daily. 11/22/17   Dani Gobble  Moen, MD  esomeprazole (NEXIUM) 40 MG capsule Take 1 capsule (40 mg total) by mouth daily. 07/11/17 07/11/18  Berna Bue, MD  fluconazole (DIFLUCAN) 150 MG tablet Take 1 tablet (150 mg total) by mouth daily. 06/14/18   Dyneisha Murchison, Gloris Manchester A, NP  metroNIDAZOLE (METROGEL VAGINAL) 0.75 % vaginal gel Place 1 Applicatorful vaginally 2 (two) times daily. 06/14/18   Hedy Garro, Gloris Manchester A, NP  ondansetron (ZOFRAN ODT) 4 MG disintegrating tablet Take 1 tablet (4 mg total) by mouth every 8 (eight) hours as needed for nausea or vomiting. 07/11/17   Berna Bue, MD  oxyCODONE (ROXICODONE) 5 MG/5ML solution Take 5-10 mLs (5-10 mg total) by mouth every 4 (four) hours as needed for moderate pain or severe pain (Please offer tylenol first). 07/11/17   Berna Bue, MD    Family  History Family History  Problem Relation Age of Onset  . Heart disease Mother   . Hypertension Mother   . Diabetes Mother   . Asthma Mother   . Heart disease Father   . Hypertension Father   . Diabetes Father   . Asthma Father   . Diabetes Maternal Grandmother   . Hypertension Maternal Grandmother   . Cancer Maternal Grandmother        urethral, lung  . Stroke Other     Social History Social History   Tobacco Use  . Smoking status: Former Smoker    Types: Cigarettes  . Smokeless tobacco: Never Used  . Tobacco comment: occ smoker not regular quit 2017  Substance Use Topics  . Alcohol use: No  . Drug use: No     Allergies   Patient has no known allergies.   Review of Systems Review of Systems  Constitutional: Negative for activity change, appetite change, fatigue and fever.  Gastrointestinal: Negative for abdominal pain, constipation, diarrhea, nausea and vomiting.  Genitourinary: Positive for vaginal discharge. Negative for difficulty urinating, dyspareunia, dysuria, flank pain, frequency, genital sores, hematuria, menstrual problem, pelvic pain and urgency.  Musculoskeletal: Negative for myalgias.  Skin: Negative for color change, pallor, rash and wound.     Physical Exam Triage Vital Signs ED Triage Vitals [06/14/18 0834]  Enc Vitals Group     BP 126/81     Pulse Rate 70     Resp 19     Temp 98.3 F (36.8 C)     Temp src      SpO2 100 %     Weight      Height      Head Circumference      Peak Flow      Pain Score 0     Pain Loc      Pain Edu?      Excl. in GC?    No data found.  Updated Vital Signs BP 126/81   Pulse 70   Temp 98.3 F (36.8 C)   Resp 19   LMP 05/17/2018   SpO2 100%   Visual Acuity Right Eye Distance:   Left Eye Distance:   Bilateral Distance:    Right Eye Near:   Left Eye Near:    Bilateral Near:     Physical Exam  Constitutional: She is oriented to person, place, and time. She appears well-developed and  well-nourished.  Very pleasant. Non toxic or ill appearing.   HENT:  Head: Normocephalic and atraumatic.  Eyes: Conjunctivae are normal.  Neck: Normal range of motion.  Pulmonary/Chest: Effort normal.  Abdominal: Soft. Bowel sounds are normal.  Abdomen soft, non tender. No CVA tenderness. No rebound tenderness.  Musculoskeletal: Normal range of motion.  Neurological: She is alert and oriented to person, place, and time.  Skin: Skin is warm and dry.  Psychiatric: She has a normal mood and affect.  Nursing note and vitals reviewed.    UC Treatments / Results  Labs (all labs ordered are listed, but only abnormal results are displayed) Labs Reviewed  POCT URINALYSIS DIP (DEVICE)  POCT PREGNANCY, URINE  CERVICOVAGINAL ANCILLARY ONLY    EKG None  Radiology No results found.  Procedures Procedures (including critical care time)  Medications Ordered in UC Medications - No data to display  Initial Impression / Assessment and Plan / UC Course  I have reviewed the triage vital signs and the nursing notes.  Pertinent labs & imaging results that were available during my care of the patient were reviewed by me and considered in my medical decision making (see chart for details).     We will go ahead and treat for BV and yeast based on history and symptoms. Testing for STDs Lab results pending and will call with any positive results  Final Clinical Impressions(s) / UC Diagnoses   Final diagnoses:  Vaginitis and vulvovaginitis     Discharge Instructions     It was nice meeting you!!  We will go ahead and treat you for BV and yeast Testing for STDs Will call for positive results.     ED Prescriptions    Medication Sig Dispense Auth. Provider   metroNIDAZOLE (METROGEL VAGINAL) 0.75 % vaginal gel Place 1 Applicatorful vaginally 2 (two) times daily. 70 g Jden Want A, NP   fluconazole (DIFLUCAN) 150 MG tablet Take 1 tablet (150 mg total) by mouth daily. 2 tablet Dahlia Byes A, NP     Controlled Substance Prescriptions [place] Controlled Substance Registry consulted? Not Applicable   Janace Aris, NP 06/14/18 [phone]

## 2018-06-14 NOTE — Discharge Instructions (Addendum)
It was nice meeting you!!  We will go ahead and treat you for BV and yeast Testing for STDs Will call for positive results.

## 2018-06-14 NOTE — ED Triage Notes (Signed)
Pt presents with milky vaginal discharge and odor x 1 week. Denies any abdominal pain.

## 2018-06-17 LAB — CERVICOVAGINAL ANCILLARY ONLY
Bacterial vaginitis: POSITIVE — AB
Candida vaginitis: NEGATIVE
Chlamydia: NEGATIVE
Neisseria Gonorrhea: NEGATIVE
Trichomonas: NEGATIVE

## 2018-07-30 ENCOUNTER — Ambulatory Visit: Payer: 59

## 2018-08-02 ENCOUNTER — Ambulatory Visit (INDEPENDENT_AMBULATORY_CARE_PROVIDER_SITE_OTHER): Payer: 59 | Admitting: Family Medicine

## 2018-08-02 DIAGNOSIS — Z3201 Encounter for pregnancy test, result positive: Secondary | ICD-10-CM | POA: Diagnosis not present

## 2018-08-02 DIAGNOSIS — N926 Irregular menstruation, unspecified: Secondary | ICD-10-CM

## 2018-08-02 LAB — POCT URINE PREGNANCY: Preg Test, Ur: POSITIVE — AB

## 2018-08-02 NOTE — Progress Notes (Signed)
    Subjective:    Patient ID: Crystal [name], female    DOB: [birth year], 32 y.o.   MRN: [id number]   CC: positive home pregnancy test  LMP Oct 11-15th  Periods usually regular every 4 weeks, no heavy bleeding when she has periods.  She is late for her period so took a home test which was positive. She states she was on OCPs but missed doses and feels that's how she got pregnant. She feels well overall, just has some breast tenderness.   Has been pregnant twice before, has had 2 prior c-sections. Has a 355 and 31 year old. No gestational hypertension or diabetes or pregnancy complications.  Had a gastric sleeve done last year July 11 2017. She is taking a daily one a day women's chewable vitamin  Smoking status reviewed- non-smoker  Review of Systems- no pelvic pain or vaginal bleeding   Objective:  There were no vitals taken for this visit. Vitals and nursing note reviewed  General: well nourished, in no acute distress HEENT: normocephalic, MMM Cardiac: regular rate Respiratory: no increased work of breathing Neuro: alert and oriented, no focal deficits Psych: mood is happy, affect congruent   Assessment & Plan:    1. Missed menses - POCT urine pregnancy POSITIVE  2. Positive pregnancy test Positive pregnancy test in office, by LMP which is reliable patient is 6629w0d today. Will obtain initial ob labs and urine cx. Discussed case with on-call physician due to her hx of gastric sleeve surgery in October 2018. He recommended 2 chewable prenatal vitamins a day. He stated there may be issues with collecting an accurate glucola test due to her prior sleeve surgery but we will address that when the time comes. She will schedule follow up initial OB appointment in 2-4 weeks depending on her schedule. All questions encouraged and answered. - Obstetric Panel, Including HIV - Culture, OB Urine - HGB FRAC. W/SOLUBILITY    Return in about 4 weeks (around 08/30/2018) for OB  visit.   Dolores PattyAngela Doylene Splinter, DO Family Medicine Resident PGY-3

## 2018-08-02 NOTE — Patient Instructions (Addendum)
Great to see you today! We'll check your ob labs and urine to get started. Please start taking TWO prenatal gummies daily.  If you experience any pelvic pain or vaginal bleeding please proceed to the MAU for evaluation  We'll get you set up for your initial OB visit with [place] in about 4 weeks.  If you have questions or concerns please do not hesitate to call at [phone].  Dolores Patty, DO PGY-3, [facility] 08/02/2018 3:05 PM  First Trimester of Pregnancy The first trimester of pregnancy is from week 1 until the end of week 13 (months 1 through 3). A week after a sperm fertilizes an egg, the egg will implant on the wall of the uterus. This embryo will begin to develop into a baby. Genes from you and your partner will form the baby. The female genes will determine whether the baby will be a boy or a girl. At 6-8 weeks, the eyes and face will be formed, and the heartbeat can be seen on ultrasound. At the end of 12 weeks, all the baby's organs will be formed. Now that you are pregnant, you will want to do everything you can to have a healthy baby. Two of the most important things are to get good prenatal care and to follow your health care provider's instructions. Prenatal care is all the medical care you receive before the baby's birth. This care will help prevent, find, and treat any problems during the pregnancy and childbirth. Body changes during your first trimester Your body goes through many changes during pregnancy. The changes vary from woman to woman.  You may gain or lose a couple of pounds at first.  You may feel sick to your stomach (nauseous) and you may throw up (vomit). If the vomiting is uncontrollable, call your health care provider.  You may tire easily.  You may develop headaches that can be relieved by medicines. All medicines should be approved by your health care provider.  You may urinate more often. Painful urination may mean you have a bladder  infection.  You may develop heartburn as a result of your pregnancy.  You may develop constipation because certain hormones are causing the muscles that push stool through your intestines to slow down.  You may develop hemorrhoids or swollen veins (varicose veins).  Your breasts may begin to grow larger and become tender. Your nipples may stick out more, and the tissue that surrounds them (areola) may become darker.  Your gums may bleed and may be sensitive to brushing and flossing.  Dark spots or blotches (chloasma, mask of pregnancy) may develop on your face. This will likely fade after the baby is born.  Your menstrual periods will stop.  You may have a loss of appetite.  You may develop cravings for certain kinds of food.  You may have changes in your emotions from day to day, such as being excited to be pregnant or being concerned that something may go wrong with the pregnancy and baby.  You may have more vivid and strange dreams.  You may have changes in your hair. These can include thickening of your hair, rapid growth, and changes in texture. Some women also have hair loss during or after pregnancy, or hair that feels dry or thin. Your hair will most likely return to normal after your baby is born.  What to expect at prenatal visits During a routine prenatal visit:  You will be weighed to make sure you and the  baby are growing normally.  Your blood pressure will be taken.  Your abdomen will be measured to track your baby's growth.  The fetal heartbeat will be listened to between weeks 10 and 14 of your pregnancy.  Test results from any previous visits will be discussed.  Your health care provider may ask you:  How you are feeling.  If you are feeling the baby move.  If you have had any abnormal symptoms, such as leaking fluid, bleeding, severe headaches, or abdominal cramping.  If you are using any tobacco products, including cigarettes, chewing tobacco, and  electronic cigarettes.  If you have any questions.  Other tests that may be performed during your first trimester include:  Blood tests to find your blood type and to check for the presence of any previous infections. The tests will also be used to check for low iron levels (anemia) and protein on red blood cells (Rh antibodies). Depending on your risk factors, or if you previously had diabetes during pregnancy, you may have tests to check for high blood sugar that affects pregnant women (gestational diabetes).  Urine tests to check for infections, diabetes, or protein in the urine.  An ultrasound to confirm the proper growth and development of the baby.  Fetal screens for spinal cord problems (spina bifida) and Down syndrome.  HIV (human immunodeficiency virus) testing. Routine prenatal testing includes screening for HIV, unless you choose not to have this test.  You may need other tests to make sure you and the baby are doing well.  Follow these instructions at home: Medicines  Follow your health care provider's instructions regarding medicine use. Specific medicines may be either safe or unsafe to take during pregnancy.  Take a prenatal vitamin that contains at least 600 micrograms (mcg) of folic acid.  If you develop constipation, try taking a stool softener if your health care provider approves. Eating and drinking  Eat a balanced diet that includes fresh fruits and vegetables, whole grains, good sources of protein such as meat, eggs, or tofu, and low-fat dairy. Your health care provider will help you determine the amount of weight gain that is right for you.  Avoid raw meat and uncooked cheese. These carry germs that can cause birth defects in the baby.  Eating four or five small meals rather than three large meals a day may help relieve nausea and vomiting. If you start to feel nauseous, eating a few soda crackers can be helpful. Drinking liquids between meals, instead of  during meals, also seems to help ease nausea and vomiting.  Limit foods that are high in fat and processed sugars, such as fried and sweet foods.  To prevent constipation: ? Eat foods that are high in fiber, such as fresh fruits and vegetables, whole grains, and beans. ? Drink enough fluid to keep your urine clear or pale yellow. Activity  Exercise only as directed by your health care provider. Most women can continue their usual exercise routine during pregnancy. Try to exercise for 30 minutes at least 5 days a week. Exercising will help you: ? Control your weight. ? Stay in shape. ? Be prepared for labor and delivery.  Experiencing pain or cramping in the lower abdomen or lower back is a good sign that you should stop exercising. Check with your health care provider before continuing with normal exercises.  Try to avoid standing for long periods of time. Move your legs often if you must stand in one place for a long time.  Avoid heavy lifting.  Wear low-heeled shoes and practice good posture.  You may continue to have sex unless your health care provider tells you not to. Relieving pain and discomfort  Wear a good support bra to relieve breast tenderness.  Take warm sitz baths to soothe any pain or discomfort caused by hemorrhoids. Use hemorrhoid cream if your health care provider approves.  Rest with your legs elevated if you have leg cramps or low back pain.  If you develop varicose veins in your legs, wear support hose. Elevate your feet for 15 minutes, 3-4 times a day. Limit salt in your diet. Prenatal care  Schedule your prenatal visits by the twelfth week of pregnancy. They are usually scheduled monthly at first, then more often in the last 2 months before delivery.  Write down your questions. Take them to your prenatal visits.  Keep all your prenatal visits as told by your health care provider. This is important. Safety  Wear your seat belt at all times when  driving.  Make a list of emergency phone numbers, including numbers for family, friends, the hospital, and police and fire departments. General instructions  Ask your health care provider for a referral to a local prenatal education class. Begin classes no later than the beginning of month 6 of your pregnancy.  Ask for help if you have counseling or nutritional needs during pregnancy. Your health care provider can offer advice or refer you to specialists for help with various needs.  Do not use hot tubs, steam rooms, or saunas.  Do not douche or use tampons or scented sanitary pads.  Do not cross your legs for long periods of time.  Avoid cat litter boxes and soil used by cats. These carry germs that can cause birth defects in the baby and possibly loss of the fetus by miscarriage or stillbirth.  Avoid all smoking, herbs, alcohol, and medicines not prescribed by your health care provider. Chemicals in these products affect the formation and growth of the baby.  Do not use any products that contain nicotine or tobacco, such as cigarettes and e-cigarettes. If you need help quitting, ask your health care provider. You may receive counseling support and other resources to help you quit.  Schedule a dentist appointment. At home, brush your teeth with a soft toothbrush and be gentle when you floss. Contact a health care provider if:  You have dizziness.  You have mild pelvic cramps, pelvic pressure, or nagging pain in the abdominal area.  You have persistent nausea, vomiting, or diarrhea.  You have a bad smelling vaginal discharge.  You have pain when you urinate.  You notice increased swelling in your face, hands, legs, or ankles.  You are exposed to fifth disease or chickenpox.  You are exposed to [place] measles (rubella) and have never had it. Get help right away if:  You have a fever.  You are leaking fluid from your vagina.  You have spotting or bleeding from your  vagina.  You have severe abdominal cramping or pain.  You have rapid weight gain or loss.  You vomit blood or material that looks like coffee grounds.  You develop a severe headache.  You have shortness of breath.  You have any kind of trauma, such as from a fall or a car accident. Summary  The first trimester of pregnancy is from week 1 until the end of week 13 (months 1 through 3).  Your body goes through many changes during pregnancy. The changes  vary from woman to woman.  You will have routine prenatal visits. During those visits, your health care provider will examine you, discuss any test results you may have, and talk with you about how you are feeling. This information is not intended to replace advice given to you by your health care provider. Make sure you discuss any questions you have with your health care provider. Document Released: 08/22/2001 Document Revised: 08/09/2016 Document Reviewed: 08/09/2016 Elsevier Interactive Patient Education  [employer].

## 2018-08-05 ENCOUNTER — Ambulatory Visit: Payer: 59 | Admitting: Family Medicine

## 2018-08-05 LAB — OBSTETRIC PANEL, INCLUDING HIV
Antibody Screen: NEGATIVE
Basophils Absolute: 0 10*3/uL (ref 0.0–0.2)
Basos: 0 %
EOS (ABSOLUTE): 0.1 10*3/uL (ref 0.0–0.4)
Eos: 2 %
HIV Screen 4th Generation wRfx: NONREACTIVE
Hematocrit: 35.8 % (ref 34.0–46.6)
Hemoglobin: 11.3 g/dL (ref 11.1–15.9)
Hepatitis B Surface Ag: NEGATIVE
Immature Grans (Abs): 0 10*3/uL (ref 0.0–0.1)
Immature Granulocytes: 0 %
Lymphocytes Absolute: 2.1 10*3/uL (ref 0.7–3.1)
Lymphs: 28 %
MCH: 25 pg — ABNORMAL LOW (ref 26.6–33.0)
MCHC: 31.6 g/dL (ref 31.5–35.7)
MCV: 79 fL (ref 79–97)
Monocytes Absolute: 0.4 10*3/uL (ref 0.1–0.9)
Monocytes: 6 %
Neutrophils Absolute: 4.8 10*3/uL (ref 1.4–7.0)
Neutrophils: 64 %
Platelets: 336 10*3/uL (ref 150–450)
RBC: 4.52 x10E6/uL (ref 3.77–5.28)
RDW: 13.5 % (ref 12.3–15.4)
RPR Ser Ql: NONREACTIVE
Rh Factor: POSITIVE
Rubella Antibodies, IGG: 4.3 index (ref >0.99)
WBC: 7.5 10*3/uL (ref 3.4–10.8)

## 2018-08-05 LAB — HGB FRAC. W/SOLUBILITY
Hgb A2 Quant: 2.2 % (ref 1.8–3.2)
Hgb A: 97.8 % (ref 96.4–98.8)
Hgb C: 0 %
Hgb F Quant: 0 % (ref 0.0–2.0)
Hgb S: 0 %
Hgb Solubility: NEGATIVE
Hgb Variant: 0 %

## 2018-08-07 LAB — URINE CULTURE, OB REFLEX

## 2018-08-07 LAB — CULTURE, OB URINE

## 2018-08-28 ENCOUNTER — Encounter: Payer: 59 | Admitting: Family Medicine

## 2018-09-11 DIAGNOSIS — O021 Missed abortion: Secondary | ICD-10-CM

## 2018-09-15 ENCOUNTER — Inpatient Hospital Stay (HOSPITAL_COMMUNITY): Payer: 59

## 2018-09-15 ENCOUNTER — Inpatient Hospital Stay (HOSPITAL_COMMUNITY)
Admission: AD | Admit: 2018-09-15 | Discharge: 2018-09-15 | Disposition: A | Discharge status: Home / Self Care | Payer: 59 | Source: Ambulatory Visit | Attending: Obstetrics and Gynecology | Admitting: Obstetrics and Gynecology

## 2018-09-15 ENCOUNTER — Other Ambulatory Visit: Payer: Self-pay

## 2018-09-15 ENCOUNTER — Encounter (HOSPITAL_COMMUNITY): Payer: Self-pay

## 2018-09-15 DIAGNOSIS — O209 Hemorrhage in early pregnancy, unspecified: Secondary | ICD-10-CM | POA: Diagnosis present

## 2018-09-15 DIAGNOSIS — Z3A12 12 weeks gestation of pregnancy: Secondary | ICD-10-CM | POA: Diagnosis not present

## 2018-09-15 DIAGNOSIS — Z3A08 8 weeks gestation of pregnancy: Secondary | ICD-10-CM | POA: Diagnosis not present

## 2018-09-15 DIAGNOSIS — O021 Missed abortion: Secondary | ICD-10-CM | POA: Diagnosis not present

## 2018-09-15 LAB — URINALYSIS, ROUTINE W REFLEX MICROSCOPIC
Bacteria, UA: NONE SEEN
Bilirubin Urine: NEGATIVE
Glucose, UA: NEGATIVE mg/dL
Ketones, ur: NEGATIVE mg/dL
Leukocytes, UA: NEGATIVE
Nitrite: NEGATIVE
Protein, ur: NEGATIVE mg/dL
Specific Gravity, Urine: 1.025 (ref 1.005–1.030)
pH: 6 (ref 5.0–8.0)

## 2018-09-15 NOTE — MAU Provider Note (Addendum)
Chief Complaint: Vaginal Bleeding   First Provider Initiated Contact with Patient 09/15/18 1859     SUBJECTIVE HPI: Timmie FoersterFigen T Fitzmaurice is a 33 y.o. [id number] at 313w2d by LMP who presents to [facility] reporting vaginal bleeding. Goes to [facility]. Has not had an ultrasound this pregnancy but did have her OB labs drawn last month.  Bleeding started this afternoon. Initially was just on toilet paper but since then she has had to wear a pad. Has passed a few small clots. Not saturating pads.  Last intercourse was 2 days ago. Denies abdominal pain.    Past Medical History:  Diagnosis Date  . Allergy   . Anemia   . Anxiety   . Depression   . H/O varicella   . History of PID    pt denies  . Hx of chlamydia infection   . Hypertension    PIH 2013 elevayed bp march 2018 no bp given due to stress  . Irregular menses   . Obese   . PCOS (polycystic ovarian syndrome)    No radiological confirmation  . Postpartum care following cesarean delivery 08/19/2012  . Status post primary low transverse cesarean section - 12/7 08/17/2012   Presumed macrosomia - FT progress with NRFT    OB History  Gravida Para Term Preterm AB Living  4 2 2  0 1 2  SAB TAB Ectopic Multiple Live Births  1     0 2    # Outcome Date GA Lbr Len/2nd Weight Sex Delivery Anes PTL Lv  4 Current           3 Term 05/21/15 3364w0d  3595 g F CS-LTranv Spinal  LIV  2 Term 08/17/12 810w3d  4255 g M CS-LTranv EPI  LIV     Birth Comments: C-section for NRFHT  1 SAB 2008             Birth Comments: No D&C   Past Surgical History:  Procedure Laterality Date  . CESAREAN SECTION  08/18/2012   Procedure: CESAREAN SECTION;x 1  Surgeon: Tresa EndoKelly A. Ernestina PennaFogleman, MD;  Location: [facility];  Service: Obstetrics;  Laterality: N/A;  Primary Cesarean Section   . CESAREAN SECTION N/A 05/21/2015   Procedure: CESAREAN SECTION;  Surgeon: Tereso NewcomerUgonna A Anyanwu, MD;  Location: [facility];  Service: Obstetrics;  Laterality: N/A;  Requested 05-21-15 @ 1:15p  . LAPAROSCOPIC  GASTRIC SLEEVE RESECTION N/A 07/10/2017   Procedure: LAPAROSCOPIC GASTRIC SLEEVE RESECTION WITH UPPER ENDO;  Surgeon: Berna Bueonnor, Chelsea A, MD;  Location: [facility];  Service: General;  Laterality: N/A;  . ROOT CANAL     Social History   Socioeconomic History  . Marital status: Single    Spouse name: Not on file  . Number of children: Not on file  . Years of education: Not on file  . Highest education level: Not on file  Occupational History  . Not on file  Social Needs  . Financial resource strain: Not on file  . Food insecurity:    Worry: Not on file    Inability: Not on file  . Transportation needs:    Medical: Not on file    Non-medical: Not on file  Tobacco Use  . Smoking status: Former Smoker    Types: Cigarettes  . Smokeless tobacco: Never Used  . Tobacco comment: occ smoker not regular quit 2017  Substance and Sexual Activity  . Alcohol use: No  . Drug use: No  . Sexual activity: Yes  Lifestyle  . Physical  activity:    Days per week: Not on file    Minutes per session: Not on file  . Stress: Not on file  Relationships  . Social connections:    Talks on phone: Not on file    Gets together: Not on file    Attends religious service: Not on file    Active member of club or organization: Not on file    Attends meetings of clubs or organizations: Not on file    Relationship status: Not on file  . Intimate partner violence:    Fear of current or ex partner: Not on file    Emotionally abused: Not on file    Physically abused: Not on file    Forced sexual activity: Not on file  Other Topics Concern  . Not on file  Social History Narrative  . Not on file   Family History  Problem Relation Age of Onset  . Heart disease Mother   . Hypertension Mother   . Diabetes Mother   . Asthma Mother   . Heart disease Father   . Hypertension Father   . Diabetes Father   . Asthma Father   . Diabetes Maternal Grandmother   . Hypertension Maternal Grandmother   . Cancer  Maternal Grandmother        urethral, lung  . Stroke Other    No current facility-administered medications on file prior to encounter.    Current Outpatient Medications on File Prior to Encounter  Medication Sig Dispense Refill  . acetaminophen (TYLENOL) 160 MG/5ML solution Take 20.3 mLs (650 mg total) by mouth every 4 (four) hours as needed for mild pain or headache (please give tylenol before offering narcotics). 120 mL 0   No Known Allergies  I have reviewed patient's Past Medical Hx, Surgical Hx, Family Hx, Social Hx, medications and allergies.   Review of Systems  Constitutional: Negative.   Gastrointestinal: Negative.   Genitourinary: Positive for vaginal bleeding.    OBJECTIVE Patient Vitals for the past 24 hrs:  BP Temp Temp src Pulse Resp Height Weight  09/15/18 1804 128/81 98.5 F (36.9 C) Oral 69 16 5\' 6"  (1.676 m) 100.2 kg   Constitutional: Well-developed, well-nourished female in no acute distress.  Cardiovascular: normal rate & rhythm, no murmur Respiratory: normal rate and effort. Lung sounds clear throughout GI: Abd soft, non-tender, Pos BS x 4. No guarding or rebound tenderness MS: Extremities nontender, no edema, normal ROM Neurologic: Alert and oriented x 4.  GU:   Cervix closed/thick. Small amount of pink mucoid blood.    LAB RESULTS Results for orders placed or performed during the hospital encounter of 09/15/18 (from the past 24 hour(s))  Urinalysis, Routine w reflex microscopic     Status: Abnormal   Collection Time: 09/15/18  6:09 PM  Result Value Ref Range   Color, Urine YELLOW YELLOW   APPearance HAZY (A) CLEAR   Specific Gravity, Urine 1.025 1.005 - 1.030   pH 6.0 5.0 - 8.0   Glucose, UA NEGATIVE NEGATIVE mg/dL   Hgb urine dipstick LARGE (A) NEGATIVE   Bilirubin Urine NEGATIVE NEGATIVE   Ketones, ur NEGATIVE NEGATIVE mg/dL   Protein, ur NEGATIVE NEGATIVE mg/dL   Nitrite NEGATIVE NEGATIVE   Leukocytes, UA NEGATIVE NEGATIVE   RBC / HPF 0-5 0  - 5 RBC/hpf   WBC, UA 0-5 0 - 5 WBC/hpf   Bacteria, UA NONE SEEN NONE SEEN   Squamous Epithelial / LPF 0-5 0 - 5   Mucus  PRESENT     IMAGING US Ob Less Than 14 Weeks With Ob Transvaginal  Result Date: 09/15/2018 CLINICAL DATA:  Vaginal bleeding. Pregnant patient. Patient is 12 weeks and 2 days pregnant based on her last menstrual period. EXAM: OBSTETRIC <14 WK [place] AND TRANSVAGINAL OB US TECHNIQUE: Both transabdominal and transvaginal ultrasound examinations were performed for complete evaluation of the gestation as well as the maternal uterus, adnexal regions, and pelvic cul-de-sac. Transvaginal technique was performed to assess early pregnancy. COMPARISON:  None. FINDINGS: Intrauterine gestational sac: Single Yolk sac:  Visualized. Embryo:  Visualized. Cardiac Activity: Not Visualized. CRL:  20.5 mm   8 w   4 d                  [place] EDC: 04/23/2019 Subchorionic hemorrhage:  None visualized. Maternal uterus/adnexae: No uterine masses. Cervix is closed. Ovaries and adnexa are unremarkable. IMPRESSION: 1. Gestational sac, yolk sac and embryo, but no detectable cardiac activity. Findings meet definitive criteria for failed pregnancy. This follows SRU consensus guidelines: Diagnostic Criteria for Nonviable Pregnancy Early in the First Trimester. [facility] [phone]. 2. No other abnormalities. Electronically Signed   By: Amie Portland M.D.   On: 09/15/2018 19:48    MAU COURSE Orders Placed This Encounter  Procedures  . US OB LESS THAN 14 WEEKS WITH OB TRANSVAGINAL  . Urinalysis, Routine w reflex microscopic  . Discharge patient   No orders of the defined types were placed in this encounter.   MDM RH positive Unable to doppler FHTs. Ultrasound ordered for viability.  Ultrasound shows IUP measuring [redacted]w[redacted]d without cardiac activity.   Discussed options for management of incomplete AB including expectant management, Cytotec or D&C. Prefers D&C. Will send msg to scheduler.   ASSESSMENT 1.  Missed abortion   2. Vaginal bleeding in pregnancy, first trimester     PLAN Discharge home in stable condition. Bleeding & infection precautions Msg to [place] to schedule D&C.  Msg to [facility] to cancel new ob appt  Follow-up Information    [facility] Follow up.   Why:  Office will call you regarding follow up appointment. Return to MAU for worsening symptoms.  Contact information: [address] [place] [address] [phone]         Allergies as of 09/15/2018   No Known Allergies     Medication List    TAKE these medications   acetaminophen 160 MG/5ML solution Commonly known as:  TYLENOL Take 20.3 mLs (650 mg total) by mouth every 4 (four) hours as needed for mild pain or headache (please give tylenol before offering narcotics).        Judeth Horn, NP 09/15/2018  8:16 PM

## 2018-09-15 NOTE — MAU Note (Signed)
Started bleeding around 1600, noticed it on the toilet paper when she wipes, reports a few small clots  Was spotting some last week  No pain

## 2018-09-15 NOTE — Discharge Instructions (Signed)
Return to care   If you have heavier bleeding that soaks through more that 2 pads per hour for an hour or more  If you bleed so much that you feel like you might pass out or you do pass out  If you have significant abdominal pain that is not improved with Tylenol   If you develop a fever > 100.5      Coping with Pregnancy Loss Pregnancy loss can happen any time during a pregnancy. Often the cause is not known. It is rarely because of anything you did. Pregnancy loss in early pregnancy (during the first trimester) is called a miscarriage. This type of pregnancy loss is the most common. Pregnancy loss that happens after 20 weeks of pregnancy is called fetal demise if the baby's heart stops beating before birth. Fetal demise is much less common. Some women experience spontaneous labor shortly after fetal demise resulting in a stillborn birth (stillbirth). Any pregnancy loss can be devastating. You will need to recover both physically and emotionally. Most women are able to get pregnant again after a pregnancy loss and deliver a healthy baby. How to manage emotional recovery  Pregnancy loss is very hard emotionally. You may feel many different emotions while you grieve. You may feel sad and angry. You may also feel guilty. It is normal to have periods of crying. Emotional recovery can take longer than physical recovery. It is different for everyone. Taking these steps can help you cope:  Remember that it is unlikely you did anything to cause the pregnancy loss.  Share your thoughts and feelings with friends, family, and your partner. Remember that your partner is also recovering emotionally.  Make sure you have a good support system, and do not spend too much time alone.  Meet with a pregnancy loss counselor or join a pregnancy loss support group.  Get enough sleep and eat a healthy diet. Return to regular exercise when you have recovered physically.  Do not use drugs or alcohol to manage  your emotions.  Consider seeing a mental health professional to help you recover emotionally.  Ask a friend or loved one to help you decide what to do with any clothing and nursery items you received for your baby. In the case of a stillbirth, many women benefit from taking additional steps in the grieving process. You may want to:  Hold your baby after the birth.  Name your baby.  Request a birth certificate.  Create a keepsake such as handprints or footprints.  Dress your baby and have a picture taken.  Make funeral arrangements.  Ask for a baptism or blessing. Hospitals have staff members who can help you with all these arrangements. How to recognize emotional stress It is normal to have emotional stress after a pregnancy loss. But emotional stress that lasts a long time or becomes severe requires treatment. Watch out for these signs of severe emotional stress:  Sadness, anger, or guilt that is not going away and is interfering with your normal activities.  Relationship problems that have occurred or gotten worse since the pregnancy loss.  Signs of depression that last longer than 2 weeks. These may include: ? Sadness. ? Anxiety. ? Hopelessness. ? Loss of interest in activities you enjoy. ? Inability to concentrate. ? Trouble sleeping or sleeping too much. ? Loss of appetite or overeating. ? Thoughts of death or of hurting yourself. Follow these instructions at home: Medicines  Take over-the-counter and prescription medicines only as told by your  health care provider. °Activity °· Rest at home until your energy level returns. Return to your normal activities as told by your health care provider. Ask your health care provider what activities are safe for you. °General instructions °· Keep all follow-up visits as told by your health care provider. This is important. °· It may be helpful to meet with others who have experienced pregnancy loss. Ask your health care provider  about support groups and resources. °· To help you and your partner with the process of grieving, talk with your health care provider or seek counseling. °· When you are ready, meet with your health care provider to discuss steps to take for a future pregnancy. °Where to find more information °· [place] Department of Health and Human Services Office on Women's Health: www.womenshealth.gov °· American Pregnancy Association: www.americanpregnancy.org °Contact a health care provider if: °· You continue to experience grief, sadness, or lack of motivation for everyday activities, and those feelings do not improve over time. °· You are struggling to recover emotionally, especially if you are using alcohol or substances to help. °Get help right away if: °· You have thoughts of hurting yourself or others. °If you ever feel like you may hurt yourself or others, or have thoughts about taking your own life, get help right away. You can go to your nearest emergency department or call: °· Your local emergency services (911 in the [place] °· A suicide crisis helpline, such as the National Suicide Prevention Lifeline at 1-800-273-8255. This is open 24 hours a day. °Summary °· Any pregnancy loss can be difficult physically and emotionally. °· You may experience many different emotions while you grieve. Emotional recovery can last longer than physical recovery. °· It is normal to have emotional stress after a pregnancy loss. But emotional stress that lasts a long time or becomes severe requires treatment. °· See your health care provider if you are struggling emotionally after a pregnancy loss. °This information is not intended to replace advice given to you by your health care provider. Make sure you discuss any questions you have with your health care provider. °Document Released: 11/08/2017 Document Revised: 11/08/2017 Document Reviewed: 11/08/2017 °Elsevier Interactive Patient Education © 2019 [employer] ° °Miscarriage °A  miscarriage is the loss of an unborn baby (fetus) before the 20th week of pregnancy. Most miscarriages happen during the first 3 months of pregnancy. Sometimes, a miscarriage can happen before a woman knows that she is pregnant. °Having a miscarriage can be an emotional experience. If you have had a miscarriage, talk with your health care provider about any questions you may have about miscarrying, the grieving process, and your plans for future pregnancy. °What are the causes? °A miscarriage may be caused by: °· Problems with the genes or chromosomes of the fetus. These problems make it impossible for the baby to develop normally. They are often the result of random errors that occur early in the development of the baby, and are not passed from parent to child (not inherited). °· Infection of the cervix or uterus. °· Conditions that affect hormone balance in the body. °· Problems with the cervix, such as the cervix opening and thinning before pregnancy is at term (cervical insufficiency). °· Problems with the uterus. These may include: °? A uterus with an abnormal shape. °? Fibroids in the uterus. °? Congenital abnormalities. These are problems that were present at birth. °· Certain medical conditions. °· Smoking, drinking alcohol, or using drugs. °· Injury (trauma). °In many cases, the   cause of a miscarriage is not known. °What are the signs or symptoms? °Symptoms of this condition include: °· Vaginal bleeding or spotting, with or without cramps or pain. °· Pain or cramping in the abdomen or lower back. °· Passing fluid, tissue, or blood clots from the vagina. °How is this diagnosed? °This condition may be diagnosed based on: °· A physical exam. °· Ultrasound. °· Blood tests. °· Urine tests. °How is this treated? °Treatment for a miscarriage is sometimes not necessary if you naturally pass all the tissue that was in your uterus. If necessary, this condition may be treated with: °· Dilation and curettage (D&C).  This is a procedure in which the cervix is stretched open and the lining of the uterus (endometrium) is scraped. This is done only if tissue from the fetus or placenta remains in the body (incomplete miscarriage). °· Medicines, such as: °? Antibiotic medicine, to treat infection. °? Medicine to help the body pass any remaining tissue. °? Medicine to reduce (contract) the size of the uterus. These medicines may be given if you have a lot of bleeding. °If you have Rh negative blood and your baby was Rh positive, you will need a shot of a medicine called Rh immunoglobulinto protect your future babies from Rh blood problems. "Rh-negative" and "Rh-positive" refer to whether or not the blood has a specific protein found on the surface of red blood cells (Rh factor). °Follow these instructions at home: °Medicines ° °· Take over-the-counter and prescription medicines only as told by your health care provider. °· If you were prescribed antibiotic medicine, take it as told by your health care provider. Do not stop taking the antibiotic even if you start to feel better. °· Do not take NSAIDs, such as aspirin and ibuprofen, unless they are approved by your health care provider. These medicines can cause bleeding. °Activity °· Rest as directed. Ask your health care provider what activities are safe for you. °· Have someone help with home and family responsibilities during this time. °General instructions °· Keep track of the number of sanitary pads you use each day and how soaked (saturated) they are. Write down this information. °· Monitor the amount of tissue or blood clots that you pass from your vagina. Save any large amounts of tissue for your health care provider to examine. °· Do not use tampons, douche, or have sex until your health care provider approves. °· To help you and your partner with the process of grieving, talk with your health care provider or seek counseling. °· When you are ready, meet with your health care  provider to discuss any important steps you should take for your health. Also, discuss steps you should take to have a healthy pregnancy in the future. °· Keep all follow-up visits as told by your health care provider. This is important. °Where to find more information °· The American Congress of Obstetricians and Gynecologists: www.acog.org °· [place] Department of Health and Human Services Office of Women’s Health: www.womenshealth.gov °Contact a health care provider if: °· You have a fever or chills. °· You have a foul smelling vaginal discharge. °· You have more bleeding instead of less. °Get help right away if: °· You have severe cramps or pain in your back or abdomen. °· You pass blood clots or tissue from your vagina that is walnut-sized or larger. °· You soak more than 1 regular sanitary pad in an hour. °· You become light-headed or weak. °· You pass out. °· You have   feelings of sadness that take over your thoughts, or you have thoughts of hurting yourself. °Summary °· Most miscarriages happen in the first 3 months of pregnancy. Sometimes miscarriage happens before a woman even knows that she is pregnant. °· Follow your health care provider's instruction for home care. Keep all follow-up appointments. °· To help you and your partner with the process of grieving, talk with your health care provider or seek counseling. °This information is not intended to replace advice given to you by your health care provider. Make sure you discuss any questions you have with your health care provider. °Document Released: 02/21/2001 Document Revised: 10/03/2016 Document Reviewed: 10/03/2016 °Elsevier Interactive Patient Education © 2019 Elsevier Inc. ° °

## 2018-09-16 ENCOUNTER — Other Ambulatory Visit: Payer: Self-pay | Admitting: Obstetrics and Gynecology

## 2018-09-16 ENCOUNTER — Telehealth (HOSPITAL_COMMUNITY): Payer: Self-pay

## 2018-09-16 ENCOUNTER — Encounter (HOSPITAL_BASED_OUTPATIENT_CLINIC_OR_DEPARTMENT_OTHER): Payer: Self-pay | Admitting: *Deleted

## 2018-09-16 NOTE — H&P (Signed)
Crystal [name] is an 33 y.o. female [phone] at [redacted]w[redacted]d by LMP who was diagnosed with a missed abortion on 09/15/2017 here for scheduled dilatation and evacuation. Patient presented to ED with vaginal bleeding and pelvic ultrasound demonstrated an 8 week IUP without cardiac activity. Patient reports some persistent vaginal bleeding. She states the bleeding has increased significantly since last night passing large clots. Patient denies chest pain, lightheadedness/dizziness  Pertinent Gynecological History: DES exposure: denies Blood transfusions: none Previous GYN Procedures: none  Last mammogram: none Last pap: normal Date: 11/2017 OB History: G4, P2012   Menstrual History: Patient's last menstrual period was 06/21/2018.    Past Medical History:  Diagnosis Date  . Anemia   . Anxiety   . Depression   . H/O varicella   . History of gestational hypertension    2013--- PIH  . Hx of chlamydia infection   . PCOS (polycystic ovarian syndrome)    No radiological confirmation  . S/P gastric bypass 07/10/2017  . Wears glasses     Past Surgical History:  Procedure Laterality Date  . CESAREAN SECTION  08/18/2012   Procedure: CESAREAN SECTION;x 1  Surgeon: Tresa Endo A. Ernestina Penna, MD;  Location: [facility];  Service: Obstetrics;  Laterality: N/A;  Primary Cesarean Section   . CESAREAN SECTION N/A 05/21/2015   Procedure: CESAREAN SECTION;  Surgeon: Tereso Newcomer, MD;  Location: [facility];  Service: Obstetrics;  Laterality: N/A;  Requested 05-21-15 @ 1:15p  . LAPAROSCOPIC GASTRIC SLEEVE RESECTION N/A 07/10/2017   Procedure: LAPAROSCOPIC GASTRIC SLEEVE RESECTION WITH UPPER ENDO;  Surgeon: Berna Bue, MD;  Location: [facility];  Service: General;  Laterality: N/A;    Family History  Problem Relation Age of Onset  . Heart disease Mother   . Hypertension Mother   . Diabetes Mother   . Asthma Mother   . Heart disease Father   . Hypertension Father   . Diabetes Father   . Asthma Father   . Diabetes  Maternal Grandmother   . Hypertension Maternal Grandmother   . Cancer Maternal Grandmother        urethral, lung  . Stroke Other     Social History:  reports that she quit smoking about 5 years ago. Her smoking use included cigarettes. She has never used smokeless tobacco. She reports previous alcohol use. She reports that she does not use drugs.  Allergies:  Allergies  Allergen Reactions  . Latex Itching    Medications Prior to Admission  Medication Sig Dispense Refill Last Dose  . Prenatal Vit-DSS-Fe Cbn-FA (PRENATAL MULTIVITAMIN-ULTRA PO) Take by mouth daily.       ROS See pertinent in HPI Last menstrual period 06/21/2018, unknown if currently breastfeeding. Physical Exam GENERAL: Well-developed, well-nourished female in no acute distress.  LUNGS: Clear to auscultation bilaterally.  HEART: Regular rate and rhythm. ABDOMEN: Soft, nontender, nondistended. No organomegaly. PELVIC: Deferred to OR EXTREMITIES: No cyanosis, clubbing, or edema, 2+ distal pulses.  No results found for this or any previous visit (from the past 24 hour(s)).  US Ob Less Than 14 Weeks With Ob Transvaginal  Result Date: 09/15/2018 CLINICAL DATA:  Vaginal bleeding. Pregnant patient. Patient is 12 weeks and 2 days pregnant based on her last menstrual period. EXAM: OBSTETRIC <14 WK [place] AND TRANSVAGINAL OB US TECHNIQUE: Both transabdominal and transvaginal ultrasound examinations were performed for complete evaluation of the gestation as well as the maternal uterus, adnexal regions, and pelvic cul-de-sac. Transvaginal technique was performed to assess early pregnancy. COMPARISON:  None.  FINDINGS: Intrauterine gestational sac: Single Yolk sac:  Visualized. Embryo:  Visualized. Cardiac Activity: Not Visualized. CRL:  20.5 mm   8 w   4 d                  US EDC: 04/23/2019 Subchorionic hemorrhage:  None visualized. Maternal uterus/adnexae: No uterine masses. Cervix is closed. Ovaries and adnexa are unremarkable.  IMPRESSION: 1. Gestational sac, yolk sac and embryo, but no detectable cardiac activity. Findings meet definitive criteria for failed pregnancy. This follows SRU consensus guidelines: Diagnostic Criteria for Nonviable Pregnancy Early in the First Trimester. Macy Mis Engl J Med [phone]. 2. No other abnormalities. Electronically Signed   By: Amie Portlandavid  Ormond M.D.   On: 09/15/2018 19:48    Assessment/Plan: 33 yo [id number] with an 611w4d missed abortion here for scheduled dilatation and evacuation - Patient with likely miscarriage in process given increase in vaginal bleeding.  - Patient opted to cancel the procedure.  - Outpatient ultrasound will be scheduled later this week - Patient to follow up at [facility] in 2 weeks  - Pain management with ibuprofen   Khasir Woodrome 09/17/2018, 9:33 AM

## 2018-09-16 NOTE — Telephone Encounter (Signed)
Called, spoke to Ms. Pott, surgery date, time and pre-op instructions given. Patient expressed understanding.

## 2018-09-16 NOTE — Progress Notes (Signed)
Spoke w/ pt via phone for pre-op interview.  Npo after mn.  Arrive at 0900.  Needs cbc.  Pt unable to come and get pre-surgery ensure drink.

## 2018-09-17 ENCOUNTER — Ambulatory Visit (HOSPITAL_BASED_OUTPATIENT_CLINIC_OR_DEPARTMENT_OTHER): Payer: 59 | Admitting: Certified Registered"

## 2018-09-17 ENCOUNTER — Encounter (HOSPITAL_BASED_OUTPATIENT_CLINIC_OR_DEPARTMENT_OTHER): Payer: Self-pay | Admitting: Certified Registered"

## 2018-09-17 ENCOUNTER — Encounter (HOSPITAL_BASED_OUTPATIENT_CLINIC_OR_DEPARTMENT_OTHER)
Admission: RE | Disposition: A | Discharge status: Home / Self Care | Payer: Self-pay | Source: Ambulatory Visit | Attending: Obstetrics and Gynecology

## 2018-09-17 ENCOUNTER — Ambulatory Visit (HOSPITAL_BASED_OUTPATIENT_CLINIC_OR_DEPARTMENT_OTHER)
Admission: RE | Admit: 2018-09-17 | Discharge: 2018-09-17 | Disposition: A | Discharge status: Home / Self Care | Payer: 59 | Source: Ambulatory Visit | Attending: Obstetrics and Gynecology | Admitting: Obstetrics and Gynecology

## 2018-09-17 DIAGNOSIS — O039 Complete or unspecified spontaneous abortion without complication: Secondary | ICD-10-CM

## 2018-09-17 DIAGNOSIS — E282 Polycystic ovarian syndrome: Secondary | ICD-10-CM | POA: Insufficient documentation

## 2018-09-17 DIAGNOSIS — Z538 Procedure and treatment not carried out for other reasons: Secondary | ICD-10-CM | POA: Diagnosis not present

## 2018-09-17 DIAGNOSIS — O021 Missed abortion: Secondary | ICD-10-CM | POA: Diagnosis present

## 2018-09-17 DIAGNOSIS — F329 Major depressive disorder, single episode, unspecified: Secondary | ICD-10-CM | POA: Diagnosis not present

## 2018-09-17 DIAGNOSIS — Z87891 Personal history of nicotine dependence: Secondary | ICD-10-CM | POA: Diagnosis not present

## 2018-09-17 DIAGNOSIS — F419 Anxiety disorder, unspecified: Secondary | ICD-10-CM | POA: Insufficient documentation

## 2018-09-17 HISTORY — DX: Presence of spectacles and contact lenses: Z97.3

## 2018-09-17 HISTORY — DX: Personal history of other complications of pregnancy, childbirth and the puerperium: Z87.59

## 2018-09-17 HISTORY — DX: Bariatric surgery status: Z98.84

## 2018-09-17 SURGERY — DILATION AND EVACUATION, UTERUS
Anesthesia: Choice

## 2018-09-17 MED ORDER — DOXYCYCLINE HYCLATE 100 MG IV SOLR
200.0000 mg | INTRAVENOUS | Status: DC
  Filled 2018-09-17: qty 200

## 2018-09-17 MED ORDER — IBUPROFEN 600 MG PO TABS
600.0000 mg | ORAL_TABLET | Freq: Once | ORAL | Status: AC
  Administered 2018-09-17: 600 mg via ORAL
  Filled 2018-09-17: qty 1

## 2018-09-17 MED ORDER — FENTANYL CITRATE (PF) 100 MCG/2ML IJ SOLN
INTRAMUSCULAR | Status: AC
  Filled 2018-09-17: qty 2

## 2018-09-17 MED ORDER — ONDANSETRON 4 MG PO TBDP
4.0000 mg | ORAL_TABLET | Freq: Once | ORAL | Status: AC
  Administered 2018-09-17: 4 mg via ORAL
  Filled 2018-09-17: qty 1

## 2018-09-17 MED ORDER — ONDANSETRON 4 MG PO TBDP
ORAL_TABLET | ORAL | Status: AC
  Filled 2018-09-17: qty 1

## 2018-09-17 MED ORDER — LIDOCAINE 2% (20 MG/ML) 5 ML SYRINGE
INTRAMUSCULAR | Status: AC
  Filled 2018-09-17: qty 5

## 2018-09-17 MED ORDER — IBUPROFEN 100 MG/5ML PO SUSP: 600.0000 mg | Freq: Four times a day (QID) | ORAL | 3 refills | Status: DC | PRN

## 2018-09-17 MED ORDER — IBUPROFEN 200 MG PO TABS
ORAL_TABLET | ORAL | Status: AC
  Filled 2018-09-17: qty 3

## 2018-09-17 MED ORDER — ONDANSETRON HCL 4 MG PO TABS
4.0000 mg | ORAL_TABLET | Freq: Once | ORAL | Status: DC
  Filled 2018-09-17: qty 1

## 2018-09-17 MED ORDER — MIDAZOLAM HCL 2 MG/2ML IJ SOLN
INTRAMUSCULAR | Status: AC
  Filled 2018-09-17: qty 2

## 2018-09-17 MED ORDER — PROPOFOL 10 MG/ML IV BOLUS
INTRAVENOUS | Status: AC
  Filled 2018-09-17: qty 40

## 2018-09-17 MED ORDER — LACTATED RINGERS IV SOLN
INTRAVENOUS | Status: DC
  Filled 2018-09-17: qty 1000

## 2018-09-17 SURGICAL SUPPLY — 21 items
CATH ROBINSON RED A/P 16FR (CATHETERS) ×1 IMPLANT
COVER WAND RF STERILE (DRAPES) ×1 IMPLANT
DECANTER SPIKE VIAL GLASS SM (MISCELLANEOUS) ×1 IMPLANT
GLOVE BIOGEL PI IND STRL 6.5 (GLOVE) ×1 IMPLANT
GLOVE BIOGEL PI IND STRL 7.0 (GLOVE) ×1 IMPLANT
GLOVE BIOGEL PI INDICATOR 6.5 (GLOVE)
GLOVE BIOGEL PI INDICATOR 7.0 (GLOVE)
GLOVE SURG SS PI 6.0 STRL IVOR (GLOVE) ×1 IMPLANT
GOWN STRL REUS W/TWL LRG LVL3 (GOWN DISPOSABLE) ×2 IMPLANT
KIT BERKELEY 1ST TRIMESTER 3/8 (MISCELLANEOUS) ×1 IMPLANT
NS IRRIG 1000ML POUR BTL (IV SOLUTION) ×1 IMPLANT
PACK VAGINAL MINOR WOMEN LF (CUSTOM PROCEDURE TRAY) ×1 IMPLANT
PAD OB MATERNITY 4.3X12.25 (PERSONAL CARE ITEMS) ×1 IMPLANT
PAD PREP 24X48 CUFFED NSTRL (MISCELLANEOUS) ×1 IMPLANT
SET BERKELEY SUCTION TUBING (SUCTIONS) ×1 IMPLANT
TOWEL OR 17X24 6PK STRL BLUE (TOWEL DISPOSABLE) ×2 IMPLANT
VACURETTE 10 RIGID CVD (CANNULA) IMPLANT
VACURETTE 6 ASPIR F TIP BERK (CANNULA) IMPLANT
VACURETTE 7MM CVD STRL WRAP (CANNULA) IMPLANT
VACURETTE 8 RIGID CVD (CANNULA) IMPLANT
VACURETTE 9 RIGID CVD (CANNULA) IMPLANT

## 2018-09-17 NOTE — Progress Notes (Signed)
Passing large amounts of blood in toilet in lobby bathroom. Brought back to preop continues to pass large amount of blood in toilet. Dr. Jolayne Panther here. Canceling surgery due to passing large amts of blood and tissue

## 2018-09-17 NOTE — Anesthesia Preprocedure Evaluation (Deleted)
Anesthesia Evaluation  Patient identified by MRN, date of birth, ID band Patient awake    Reviewed: Allergy & Precautions, NPO status , Patient's Chart, lab work & pertinent test results  Airway Mallampati: II  TM Distance: >3 FB Neck ROM: Full    Dental no notable dental hx. (+) Dental Advisory Given   Pulmonary former smoker,    Pulmonary exam normal breath sounds clear to auscultation       Cardiovascular hypertension, Normal cardiovascular exam Rhythm:Regular Rate:Normal     Neuro/Psych PSYCHIATRIC DISORDERS Anxiety Depression negative neurological ROS     GI/Hepatic negative GI ROS, Neg liver ROS,   Endo/Other  negative endocrine ROS  Renal/GU negative Renal ROS  negative genitourinary   Musculoskeletal negative musculoskeletal ROS (+)   Abdominal   Peds negative pediatric ROS (+)  Hematology negative hematology ROS (+)   Anesthesia Other Findings   Reproductive/Obstetrics negative OB ROS                            Anesthesia Physical  Anesthesia Plan  ASA: II  Anesthesia Plan: General   Post-op Pain Management:    Induction: Intravenous  PONV Risk Score and Plan: 4 or greater and Ondansetron, Dexamethasone, Scopolamine patch - Pre-op and Treatment may vary due to age or medical condition  Airway Management Planned: LMA  Additional Equipment:   Intra-op Plan:   Post-operative Plan: Extubation in OR  Informed Consent: I have reviewed the patients History and Physical, chart, labs and discussed the procedure including the risks, benefits and alternatives for the proposed anesthesia with the patient or authorized representative who has indicated his/her understanding and acceptance.   Dental advisory given  Plan Discussed with: CRNA and Surgeon  Anesthesia Plan Comments:         Anesthesia Quick Evaluation

## 2018-09-18 ENCOUNTER — Telehealth: Payer: Self-pay

## 2018-09-18 NOTE — Telephone Encounter (Signed)
-----   Message from Catalina Antigua, MD sent at 09/17/2018  9:39 AM EST ----- Please schedule patient for follow-up from spontaneous miscarriage in 2 weeks.  Please ensure that patient has an ultrasound week of 1/13 (order in epic) prior to her appointment.  Thanks  [employer]

## 2018-09-18 NOTE — Telephone Encounter (Signed)
Called pt to advise of US appt on 09/20/18 @ 3pm. Pt verbalized understanding.

## 2018-09-20 ENCOUNTER — Encounter: Payer: 59 | Admitting: Family Medicine

## 2018-09-20 ENCOUNTER — Ambulatory Visit (HOSPITAL_COMMUNITY)
Admission: RE | Admit: 2018-09-20 | Discharge: 2018-09-20 | Disposition: A | Discharge status: Home / Self Care | Payer: 59 | Source: Ambulatory Visit | Attending: Obstetrics and Gynecology | Admitting: Obstetrics and Gynecology

## 2018-09-20 DIAGNOSIS — O039 Complete or unspecified spontaneous abortion without complication: Secondary | ICD-10-CM | POA: Insufficient documentation

## 2018-09-20 DIAGNOSIS — Z8759 Personal history of other complications of pregnancy, childbirth and the puerperium: Secondary | ICD-10-CM | POA: Diagnosis not present

## 2018-09-23 ENCOUNTER — Other Ambulatory Visit: Payer: Self-pay | Admitting: Obstetrics and Gynecology

## 2018-09-23 MED ORDER — IBUPROFEN 100 MG/5ML PO SUSP: 600.0000 mg | Freq: Four times a day (QID) | ORAL | 3 refills | Status: DC | PRN

## 2018-09-23 MED ORDER — MISOPROSTOL 200 MCG PO TABS: ORAL_TABLET | ORAL | 1 refills | Status: DC

## 2018-09-23 MED ORDER — OXYCODONE-ACETAMINOPHEN 5-325 MG PO TABS: 1.0000 | ORAL_TABLET | Freq: Four times a day (QID) | ORAL | 0 refills | Status: DC | PRN

## 2018-09-23 NOTE — Progress Notes (Signed)
Patient contacted to review ultrasound report on 1/10 which demonstrated retained products of conception. Patient reports persistent vaginal bleeding but not as heavy as previously. She has returned to work. Discussed medical management with cytotec which the patient agreed to. Follow up as scheduled on 1/22

## 2018-10-02 ENCOUNTER — Ambulatory Visit (INDEPENDENT_AMBULATORY_CARE_PROVIDER_SITE_OTHER): Payer: 59 | Admitting: Nurse Practitioner

## 2018-10-02 ENCOUNTER — Encounter: Payer: Self-pay | Admitting: Nurse Practitioner

## 2018-10-02 VITALS — BP 126/81 | HR 76 | Ht 66.0 in | Wt 216.3 lb

## 2018-10-02 DIAGNOSIS — Z5189 Encounter for other specified aftercare: Secondary | ICD-10-CM

## 2018-10-02 DIAGNOSIS — R87615 Unsatisfactory cytologic smear of cervix: Secondary | ICD-10-CM | POA: Insufficient documentation

## 2018-10-02 DIAGNOSIS — O039 Complete or unspecified spontaneous abortion without complication: Secondary | ICD-10-CM | POA: Diagnosis not present

## 2018-10-02 LAB — POCT PREGNANCY, URINE: Preg Test, Ur: POSITIVE — AB

## 2018-10-02 NOTE — Progress Notes (Signed)
Pt with elevated GAD-7 scores.  Pt declines to see [facility] stating that the scores are reflective of her normal anxiety.

## 2018-10-02 NOTE — Patient Instructions (Signed)
Continue to use condoms for each intercourse.

## 2018-10-02 NOTE — Progress Notes (Signed)
GYNECOLOGY OFFICE VISIT NOTE   History:  33 y.o. [id number] here today for follow up from a recent miscarriage.  Previous notes reviewed - was scheduled for a D&C but began bleeding before her procedure.  The procedure was cancelled.  Then she had an ultrasound 2 days later and POC still identified in the lower uterine segment.  Cytotec was sent in and she took it on Jan 13.  Had some bleeding but not as severe as the bleeding she was having prior to Jan. 10. Continues to have periodic spotting - starts and stops.  Wants to start contraception as she does not desire any more children at this point.  Past Medical History:  Diagnosis Date  . Anemia   . Anxiety   . Depression   . H/O varicella   . History of gestational hypertension    2013--- PIH  . Hx of chlamydia infection   . PCOS (polycystic ovarian syndrome)    No radiological confirmation  . S/P gastric bypass 07/10/2017  . Wears glasses     Past Surgical History:  Procedure Laterality Date  . CESAREAN SECTION  08/18/2012   Procedure: CESAREAN SECTION;x 1  Surgeon: Tresa Endo A. Ernestina Penna, MD;  Location: [facility];  Service: Obstetrics;  Laterality: N/A;  Primary Cesarean Section   . CESAREAN SECTION N/A 05/21/2015   Procedure: CESAREAN SECTION;  Surgeon: Tereso Newcomer, MD;  Location: [facility];  Service: Obstetrics;  Laterality: N/A;  Requested 05-21-15 @ 1:15p  . LAPAROSCOPIC GASTRIC SLEEVE RESECTION N/A 07/10/2017   Procedure: LAPAROSCOPIC GASTRIC SLEEVE RESECTION WITH UPPER ENDO;  Surgeon: Berna Bue, MD;  Location: [facility];  Service: General;  Laterality: N/A;    The following portions of the patient's history were reviewed and updated as appropriate: allergies, current medications, past family history, past medical history, past social history, past surgical history and problem list.   Health Maintenance:  Last pap on 11-21-17 did not have T zone collected - pap needs to be repeated.  Review of Systems:  Pertinent items noted in  HPI and remainder of comprehensive ROS otherwise negative.  Objective:  Physical Exam BP 126/81   Pulse 76   Ht 5\' 6"  (1.676 m)   Wt 216 lb 4.8 oz (98.1 kg)   LMP 06/21/2018   Breastfeeding Unknown   BMI 34.91 kg/m  CONSTITUTIONAL: Well-developed, well-nourished female in no acute distress.  HENT:  Normocephalic, atraumatic. EYES: Conjunctivae and EOM are normal. Pupils are equal, round.  No scleral icterus.  NECK: Normal range of motion, supple, SKIN: Skin is warm and dry. No rash noted. Not diaphoretic. No erythema. No pallor. NEUROLOGIC: Alert and oriented to person, place, and time. Normal muscle tone coordination. No cranial nerve deficit noted. PSYCHIATRIC: Normal mood and affect. Normal behavior. Normal judgment and thought content. MUSCULOSKELETAL: Normal range of motion. No edema noted.  Labs and Imaging Faintly positive urine pregnancy test in the office today.  Consult with Dr. Macon Large today.  Will get follow up US>  US Pelvis Transvanginal Non-ob (tv Only)  Result Date: 09/20/2018 CLINICAL DATA:  Patient with reported history of spontaneous miscarriage. EXAM: ULTRASOUND PELVIS TRANSVAGINAL TECHNIQUE: Transvaginal ultrasound examination of the pelvis was performed including evaluation of the uterus, ovaries, adnexal regions, and pelvic cul-de-sac. COMPARISON:  Pelvic ultrasound 09/15/2018 FINDINGS: Uterus Measurements: 13.5 x 6.7 x 8.4 cm = volume: 394.5 mL. No fibroids or other mass visualized. Endometrium Gestational sac and fetal pole identified within the lower uterine segment and endocervical canal. No  fetal heartbeat identified. Right ovary Measurements: 3.4 x 2.4 x 2.6 cm = volume: 10.6 mL. Normal appearance/no adnexal mass. Left ovary Not visualized. Other findings:  No abnormal free fluid IMPRESSION: Gestational sac and nonviable fetal pole identified within the lower uterine segment/endocervical canal most compatible with history of impending miscarriage. No fetal  heartbeat identified, compatible with failed pregnancy. Electronically Signed   By: Annia Belt M.D.   On: 09/20/2018 15:57   US Ob Less Than 14 Weeks With Ob Transvaginal  Result Date: 09/15/2018 CLINICAL DATA:  Vaginal bleeding. Pregnant patient. Patient is 12 weeks and 2 days pregnant based on her last menstrual period. EXAM: OBSTETRIC <14 WK [place] AND TRANSVAGINAL OB US TECHNIQUE: Both transabdominal and transvaginal ultrasound examinations were performed for complete evaluation of the gestation as well as the maternal uterus, adnexal regions, and pelvic cul-de-sac. Transvaginal technique was performed to assess early pregnancy. COMPARISON:  None. FINDINGS: Intrauterine gestational sac: Single Yolk sac:  Visualized. Embryo:  Visualized. Cardiac Activity: Not Visualized. CRL:  20.5 mm   8 w   4 d                  [place] EDC: 04/23/2019 Subchorionic hemorrhage:  None visualized. Maternal uterus/adnexae: No uterine masses. Cervix is closed. Ovaries and adnexa are unremarkable. IMPRESSION: 1. Gestational sac, yolk sac and embryo, but no detectable cardiac activity. Findings meet definitive criteria for failed pregnancy. This follows SRU consensus guidelines: Diagnostic Criteria for Nonviable Pregnancy Early in the First Trimester. Macy Mis J Med [phone]. 2. No other abnormalities. Electronically Signed   By: Amie Portland M.D.   On: 09/15/2018 19:48    Assessment & Plan:  1. Follow-up visit after miscarriage Pregnancy test continues to be slightly positive Consult with Dr. Macon Large - will get ultrasound to make sure there are no products of conception. Client wants Depo as soon as ultrasound confirms full miscarriage has occurred.  Note made to be seen in clinic the same morning as the ultrasound so she can get Depo. Has never taken Depo before and if she does well on 1-2 doses of Depo, may want to change to Nexplanon. Client has been having intercourse and states she is using condoms with  intercourse.  Routine preventative health maintenance measures emphasized. Please refer to After Visit Summary for other counseling recommendations.   Will schedule follow up ultrasound and client wants to start Depo if the [place] confirms no further products of conception.    Total face-to-face time with patient: 15 minutes.  Over 50% of encounter was spent on counseling and coordination of care.  Nolene Bernheim, RN, MSN, NP-BC Nurse Practitioner, [facility] for [employer], [facility] 10/02/2018 4:02 PM

## 2018-10-08 ENCOUNTER — Ambulatory Visit (HOSPITAL_COMMUNITY)
Admission: RE | Admit: 2018-10-08 | Discharge: 2018-10-08 | Disposition: A | Discharge status: Home / Self Care | Payer: 59 | Source: Ambulatory Visit | Attending: Nurse Practitioner | Admitting: Nurse Practitioner

## 2018-10-08 ENCOUNTER — Other Ambulatory Visit: Payer: Self-pay | Admitting: Obstetrics and Gynecology

## 2018-10-08 ENCOUNTER — Other Ambulatory Visit: Payer: Self-pay | Admitting: Nurse Practitioner

## 2018-10-08 ENCOUNTER — Ambulatory Visit (INDEPENDENT_AMBULATORY_CARE_PROVIDER_SITE_OTHER): Payer: 59 | Admitting: Student

## 2018-10-08 DIAGNOSIS — O039 Complete or unspecified spontaneous abortion without complication: Secondary | ICD-10-CM | POA: Diagnosis not present

## 2018-10-08 DIAGNOSIS — Z5189 Encounter for other specified aftercare: Secondary | ICD-10-CM | POA: Insufficient documentation

## 2018-10-08 DIAGNOSIS — Z30013 Encounter for initial prescription of injectable contraceptive: Secondary | ICD-10-CM

## 2018-10-08 DIAGNOSIS — R9389 Abnormal findings on diagnostic imaging of other specified body structures: Secondary | ICD-10-CM | POA: Diagnosis not present

## 2018-10-08 DIAGNOSIS — O034 Incomplete spontaneous abortion without complication: Secondary | ICD-10-CM | POA: Diagnosis not present

## 2018-10-08 MED ORDER — MEDROXYPROGESTERONE ACETATE 150 MG/ML IM SUSP
150.0000 mg | Freq: Once | INTRAMUSCULAR | Status: AC
  Administered 2018-10-08: 150 mg via INTRAMUSCULAR

## 2018-10-08 NOTE — Patient Instructions (Signed)
Dilation and Curettage or Vacuum Curettage, Care After  These instructions give you information about caring for yourself after your procedure. Your doctor may also give you more specific instructions. Call your doctor if you have any problems or questions after your procedure.  Follow these instructions at home:  Activity  · Do not drive or use heavy machinery while taking prescription pain medicine.  · For 24 hours after your procedure, avoid driving.  · Take short walks often, followed by rest periods. Ask your doctor what activities are safe for you. After one or two days, you may be able to return to your normal activities.  · Do not lift anything that is heavier than 10 lb (4.5 kg) until your doctor approves.  · For at least 2 weeks, or as long as told by your doctor:  ? Do not douche.  ? Do not use tampons.  ? Do not have sex.  General instructions    · Take over-the-counter and prescription medicines only as told by your doctor. This is very important if you take blood thinning medicine.  · Do not take baths, swim, or use a hot tub until your doctor approves. Take showers instead of baths.  · Wear compression stockings as told by your doctor.  · It is up to you to get the results of your procedure. Ask your doctor when your results will be ready.  · Keep all follow-up visits as told by your doctor. This is important.  Contact a doctor if:  · You have very bad cramps that get worse or do not get better with medicine.  · You have very bad pain in your belly (abdomen).  · You cannot drink fluids without throwing up (vomiting).  · You get pain in a different part of the area between your belly and thighs (pelvis).  · You have bad-smelling discharge from your vagina.  · You have a rash.  Get help right away if:  · You are bleeding a lot from your vagina. A lot of bleeding means soaking more than one sanitary pad in an hour, for 2 hours in a row.  · You have clumps of blood (blood clots) coming from your  vagina.  · You have a fever or chills.  · Your belly feels very tender or hard.  · You have chest pain.  · You have trouble breathing.  · You cough up blood.  · You feel dizzy.  · You feel light-headed.  · You pass out (faint).  · You have pain in your neck or shoulder area.  Summary  · Take short walks often, followed by rest periods. Ask your doctor what activities are safe for you. After one or two days, you may be able to return to your normal activities.  · Do not lift anything that is heavier than 10 lb (4.5 kg) until your doctor approves.  · Do not take baths, swim, or use a hot tub until your doctor approves. Take showers instead of baths.  · Contact your doctor if you have any symptoms of infection, like bad-smelling discharge from your vagina.  This information is not intended to replace advice given to you by your health care provider. Make sure you discuss any questions you have with your health care provider.  Document Released: 06/06/2008 Document Revised: 05/15/2016 Document Reviewed: 05/15/2016  Elsevier Interactive Patient Education © 2019 [employer]

## 2018-10-08 NOTE — Progress Notes (Signed)
Spoke with patient to complete pre op phone call. Pt informed me that she would just like to try the cytotec again and that she can't get the time off from work to come tomorrow. I gave her the number to the Dr Vergie LivingPickens office and asked her to call and speak with them to make sure that they are aware of her situation.

## 2018-10-08 NOTE — Progress Notes (Signed)
History:  [name] is a 33 y.o. [id number] who presents to clinic today for miscarriage follow up. Diagnosed with miscarriage on 1/5. Had cytotec but had ultrasound this morning that shows retained POCs. Was initially prescribed cytotec twice, but states she only took it once. Would like D&C at this point as this has been a long process & she feels like the cytotec will not work.    Patient Active Problem List   Diagnosis Date Noted  . Unsatisfactory cervical Papanicolaou smear 10/02/2018  . History of bariatric surgery 11/23/2017  . Morbid obesity (HCC) 07/10/2017  . Mood disorder (HCC) 11/19/2016  . Weight loss counseling, encounter for 05/03/2016  . Obesity 07/02/2015  . Anemia, iron deficiency 05/06/2015  . Lumbar back pain 09/16/2014  . POLYCYSTIC OVARIAN DISEASE 09/24/2009    Allergies  Allergen Reactions  . Latex Itching    No current outpatient medications on file prior to visit.   No current facility-administered medications on file prior to visit.      The following portions of the patient's history were reviewed and updated as appropriate: allergies, current medications, family history, past medical history, social history, past surgical history and problem list.  Review of Systems:  Other than those mentioned in HPI all ROS negative   Objective:  Physical Exam LMP 06/21/2018  CONSTITUTIONAL: Well-developed, well-nourished female in no acute distress.  HEAD: Normocephalic, atraumatic RESPIRATORY: Effort normal, no problems with respiration noted. PSYCHIATRIC: Normal mood and affect. Normal behavior. Normal judgment and thought content.    Labs and Imaging US Pelvis Transvanginal Non-ob (tv Only)  Result Date: 10/08/2018 CLINICAL DATA:  Evaluate for retained products of conception. Status post miscarriage. EXAM: ULTRASOUND PELVIS TRANSVAGINAL TECHNIQUE: Transvaginal ultrasound examination of the pelvis was performed including evaluation of the uterus,  ovaries, adnexal regions, and pelvic cul-de-sac. COMPARISON:  09/20/2018 FINDINGS: Uterus Measurements: 9.6 x 5.7 x 6.8 cm = volume: 196.32 mL. No fibroids or other mass visualized. Endometrium Thickness: 15 mm. The endometrium appears heterogeneous with areas of increased blood flow noted posteriorly. Right ovary Measurements: 3.8 x 2.5 x 2.3 cm = volume: 11.44 mL. Normal appearance/no adnexal mass. Left ovary Measurements: 2.9 x 2.6 x 3.0 cm = volume: 13.1 mL. Normal appearance/no adnexal mass. Other findings:  No abnormal free fluid IMPRESSION: 1. The endometrium appears thickened and heterogeneous with increased blood flow posteriorly. In the setting of spontaneous abortion imaging findings are compatible with retained products of conception. Electronically Signed   By: Signa Kell M.D.   On: 10/08/2018 09:55     Ultrasound reviewed by Dr. Jolayne Panther. Will offer patient D&C. Patient can get depo provera injection today per her preference.   Assessment & Plan:  1. Retained products of conception after miscarriage -message send to [place] to schedule D&C  2. Encounter for initial prescription of injectable contraceptive -depo injection given today   Judeth Horn, NP 10/08/2018 12:09 PM

## 2018-10-09 ENCOUNTER — Telehealth: Payer: Self-pay

## 2018-10-09 ENCOUNTER — Encounter (HOSPITAL_BASED_OUTPATIENT_CLINIC_OR_DEPARTMENT_OTHER): Admission: RE | Payer: Self-pay | Source: Ambulatory Visit

## 2018-10-09 ENCOUNTER — Ambulatory Visit (HOSPITAL_BASED_OUTPATIENT_CLINIC_OR_DEPARTMENT_OTHER): Admission: RE | Admit: 2018-10-09 | Payer: 59 | Source: Ambulatory Visit | Admitting: Obstetrics and Gynecology

## 2018-10-09 DIAGNOSIS — O034 Incomplete spontaneous abortion without complication: Secondary | ICD-10-CM

## 2018-10-09 SURGERY — DILATION AND EVACUATION, UTERUS
Anesthesia: Choice

## 2018-10-09 NOTE — Telephone Encounter (Addendum)
-----   Message from Deerfield Bing, MD sent at 10/08/2018  3:00 PM EST ----- Regarding: pt cancelled her surgery for tomorrow 1/29 Rhea Bleacher, please cancel her surgery for tomorrow  Clinic, can you please make her a 7-10d transvaginal u/s follow up visit with any MD? thanks  [place] scheduled for February 5th @ 0900.  MD provider visit scheduled for February 6th @ 1035.  Pt notified of appointments.  Pt then asks if she needs to take the Cytotec.  Per Dr. Earlene Plater, pt can take the Cytotec if desires.  I explained to the pt that she can take the Cytotec and she will follow up next week. I asked pt the reason for her canceling her surgery.  Pt informed me that she did not have the time to take off of work so she canceled.  I advised the pt that if she has any concerns to please give the office a call.  Pt stated understanding with no further questions.

## 2018-10-11 ENCOUNTER — Ambulatory Visit (HOSPITAL_COMMUNITY): Payer: 59

## 2018-10-15 ENCOUNTER — Ambulatory Visit: Payer: 59 | Admitting: Family Medicine

## 2018-10-16 ENCOUNTER — Ambulatory Visit (HOSPITAL_COMMUNITY)
Admission: RE | Admit: 2018-10-16 | Discharge: 2018-10-16 | Disposition: A | Discharge status: Home / Self Care | Payer: 59 | Source: Ambulatory Visit | Attending: Obstetrics and Gynecology | Admitting: Obstetrics and Gynecology

## 2018-10-16 DIAGNOSIS — O034 Incomplete spontaneous abortion without complication: Secondary | ICD-10-CM

## 2018-10-17 ENCOUNTER — Ambulatory Visit: Payer: 59 | Admitting: Obstetrics & Gynecology

## 2019-03-24 ENCOUNTER — Encounter (HOSPITAL_COMMUNITY): Payer: Self-pay

## 2019-12-26 ENCOUNTER — Other Ambulatory Visit: Payer: Self-pay

## 2019-12-26 ENCOUNTER — Encounter (HOSPITAL_COMMUNITY): Payer: Self-pay

## 2019-12-26 DIAGNOSIS — O3480 Maternal care for other abnormalities of pelvic organs, unspecified trimester: Secondary | ICD-10-CM | POA: Insufficient documentation

## 2019-12-26 DIAGNOSIS — Z91040 Latex allergy status: Secondary | ICD-10-CM | POA: Insufficient documentation

## 2019-12-26 DIAGNOSIS — Z3A00 Weeks of gestation of pregnancy not specified: Secondary | ICD-10-CM | POA: Insufficient documentation

## 2019-12-26 DIAGNOSIS — O2691 Pregnancy related conditions, unspecified, first trimester: Secondary | ICD-10-CM | POA: Insufficient documentation

## 2019-12-26 DIAGNOSIS — O209 Hemorrhage in early pregnancy, unspecified: Secondary | ICD-10-CM | POA: Insufficient documentation

## 2019-12-26 DIAGNOSIS — R42 Dizziness and giddiness: Secondary | ICD-10-CM | POA: Insufficient documentation

## 2019-12-26 DIAGNOSIS — Z87891 Personal history of nicotine dependence: Secondary | ICD-10-CM | POA: Insufficient documentation

## 2019-12-26 DIAGNOSIS — Z79899 Other long term (current) drug therapy: Secondary | ICD-10-CM | POA: Insufficient documentation

## 2019-12-26 DIAGNOSIS — N83201 Unspecified ovarian cyst, right side: Secondary | ICD-10-CM | POA: Insufficient documentation

## 2019-12-26 NOTE — ED Triage Notes (Signed)
Abortion in march. Took pill. Yesterday started heavy bleeding 3 pads an hour with large clots.

## 2019-12-27 ENCOUNTER — Emergency Department (HOSPITAL_COMMUNITY)
Admission: EM | Admit: 2019-12-27 | Discharge: 2019-12-27 | Disposition: A | Discharge status: Home / Self Care | Payer: Self-pay | Attending: Emergency Medicine | Admitting: Emergency Medicine

## 2019-12-27 ENCOUNTER — Emergency Department (HOSPITAL_COMMUNITY): Payer: Self-pay

## 2019-12-27 DIAGNOSIS — N939 Abnormal uterine and vaginal bleeding, unspecified: Secondary | ICD-10-CM

## 2019-12-27 DIAGNOSIS — N83201 Unspecified ovarian cyst, right side: Secondary | ICD-10-CM

## 2019-12-27 LAB — COMPREHENSIVE METABOLIC PANEL
ALT: 18 U/L (ref 0–44)
AST: 22 U/L (ref 15–41)
Albumin: 3.8 g/dL (ref 3.5–5.0)
Alkaline Phosphatase: 39 U/L (ref 38–126)
Anion gap: 8 (ref 5–15)
BUN: 10 mg/dL (ref 6–20)
CO2: 23 mmol/L (ref 22–32)
Calcium: 8.6 mg/dL — ABNORMAL LOW (ref 8.9–10.3)
Chloride: 107 mmol/L (ref 98–111)
Creatinine, Ser: 0.8 mg/dL (ref 0.44–1.00)
GFR calc Af Amer: >60 mL/min (ref >60)
GFR calc non Af Amer: >60 mL/min (ref >60)
Glucose, Bld: 95 mg/dL (ref 70–99)
Potassium: 3.4 mmol/L — ABNORMAL LOW (ref 3.5–5.1)
Sodium: 138 mmol/L (ref 135–145)
Total Bilirubin: 0.6 mg/dL (ref 0.3–1.2)
Total Protein: 7.2 g/dL (ref 6.5–8.1)

## 2019-12-27 LAB — HCG, QUANTITATIVE, PREGNANCY: hCG, Beta Chain, Quant, S: 4063 m[IU]/mL — ABNORMAL HIGH (ref <5)

## 2019-12-27 LAB — CBC
HCT: 28.3 % — ABNORMAL LOW (ref 36.0–46.0)
Hemoglobin: 7.9 g/dL — ABNORMAL LOW (ref 12.0–15.0)
MCH: 20.7 pg — ABNORMAL LOW (ref 26.0–34.0)
MCHC: 27.9 g/dL — ABNORMAL LOW (ref 30.0–36.0)
MCV: 74.1 fL — ABNORMAL LOW (ref 80.0–100.0)
Platelets: 286 10*3/uL (ref 150–400)
RBC: 3.82 MIL/uL — ABNORMAL LOW (ref 3.87–5.11)
RDW: 18.9 % — ABNORMAL HIGH (ref 11.5–15.5)
WBC: 9 10*3/uL (ref 4.0–10.5)
nRBC: 0 % (ref 0.0–0.2)

## 2019-12-27 LAB — TYPE AND SCREEN
ABO/RH(D): B POS
Antibody Screen: NEGATIVE

## 2019-12-27 LAB — URINALYSIS, ROUTINE W REFLEX MICROSCOPIC
Bacteria, UA: NONE SEEN
Bilirubin Urine: NEGATIVE
Glucose, UA: NEGATIVE mg/dL
Ketones, ur: NEGATIVE mg/dL
Leukocytes,Ua: NEGATIVE
Nitrite: NEGATIVE
Protein, ur: 100 mg/dL — AB
RBC / HPF: >50 RBC/hpf — ABNORMAL HIGH (ref 0–5)
Specific Gravity, Urine: 1.027 (ref 1.005–1.030)
WBC, UA: >50 WBC/hpf — ABNORMAL HIGH (ref 0–5)
pH: 5 (ref 5.0–8.0)

## 2019-12-27 LAB — ABO/RH: ABO/RH(D): B POS

## 2019-12-27 LAB — PREGNANCY, URINE: Preg Test, Ur: POSITIVE — AB

## 2019-12-27 LAB — WET PREP, GENITAL
Clue Cells Wet Prep HPF POC: NONE SEEN
Sperm: NONE SEEN
Trich, Wet Prep: NONE SEEN
WBC, Wet Prep HPF POC: NONE SEEN
Yeast Wet Prep HPF POC: NONE SEEN

## 2019-12-27 MED ORDER — METHYLERGONOVINE MALEATE 0.2 MG/ML IJ SOLN
0.2000 mg | Freq: Once | INTRAMUSCULAR | Status: AC
  Administered 2019-12-27: 0.2 mg via INTRAMUSCULAR
  Filled 2019-12-27: qty 1

## 2019-12-27 NOTE — ED Provider Notes (Signed)
[facility]-EMERGENCY DEPT Provider Note   CSN: [id number] Arrival date & time: 12/26/19  2258     History Chief Complaint  Patient presents with  . Vaginal Bleeding    Crystal [name] is a 34 y.o. female with a history of gastric sleeve, chlamydia, PCOS who presents to the emergency department with a chief complaint of vaginal bleeding.  The patient had a chemical abortion with misoprostol on 3/12.  She was around [redacted] weeks pregnant at this time.  Last menstrual cycle was around Dec. 20, 2020.   She has been having vaginal spotting for the last month, but yesterday she developed heavy vaginal bleeding with large clots.  She reports that the heavy bleeding seemed to resolve by the end of the day and the spotting resumed.  Today, she began having much heavier vaginal bleeding with frequent, large clots that has persisted throughout the day.  She reports that she was wearing a tampon and was having to change her pads 2-3 times an hour.  She reports associated bilateral lower abdominal cramping, left greater than right.  Pain is constant has been worsening since onset.   She has been feeling lightheaded for several weeks, but this has not worsened recently.  No dizziness, shortness of breath, nausea, vomiting, fever, chills, back pain, dysuria, or vaginal discharge.  She is sexually active with 1 female partner.  No new partners.  They do not use protection.  She is not on any form of birth control.  She has not established with an OB/GYN.  The history is provided by the patient. No language interpreter was used.       Past Medical History:  Diagnosis Date  . Anemia   . Anxiety   . Depression   . H/O varicella   . History of gestational hypertension    2013--- PIH  . Hx of chlamydia infection   . PCOS (polycystic ovarian syndrome)    No radiological confirmation  . S/P gastric bypass 07/10/2017  . Wears glasses     Patient Active Problem List   Diagnosis Date  Noted  . Unsatisfactory cervical Papanicolaou smear 10/02/2018  . History of bariatric surgery 11/23/2017  . Morbid obesity (HCC) 07/10/2017  . Mood disorder (HCC) 11/19/2016  . Weight loss counseling, encounter for 05/03/2016  . Obesity 07/02/2015  . Anemia, iron deficiency 05/06/2015  . Lumbar back pain 09/16/2014  . POLYCYSTIC OVARIAN DISEASE 09/24/2009    Past Surgical History:  Procedure Laterality Date  . CESAREAN SECTION  08/18/2012   Procedure: CESAREAN SECTION;x 1  Surgeon: Tresa Endo A. Ernestina Penna, MD;  Location: [facility];  Service: Obstetrics;  Laterality: N/A;  Primary Cesarean Section   . CESAREAN SECTION N/A 05/21/2015   Procedure: CESAREAN SECTION;  Surgeon: Tereso Newcomer, MD;  Location: [facility];  Service: Obstetrics;  Laterality: N/A;  Requested 05-21-15 @ 1:15p  . LAPAROSCOPIC GASTRIC SLEEVE RESECTION N/A 07/10/2017   Procedure: LAPAROSCOPIC GASTRIC SLEEVE RESECTION WITH UPPER ENDO;  Surgeon: Berna Bue, MD;  Location: [facility];  Service: General;  Laterality: N/A;     OB History    Gravida  4   Para  2   Term  2   Preterm  0   AB  2   Living  2     SAB  2   TAB      Ectopic      Multiple  0   Live Births  2  Family History  Problem Relation Age of Onset  . Heart disease Mother   . Hypertension Mother   . Diabetes Mother   . Asthma Mother   . Heart disease Father   . Hypertension Father   . Diabetes Father   . Asthma Father   . Diabetes Maternal Grandmother   . Hypertension Maternal Grandmother   . Cancer Maternal Grandmother        urethral, lung  . Stroke Other     Social History   Tobacco Use  . Smoking status: Former Smoker    Types: Cigarettes    Quit date: 09/16/2013    Years since quitting: 6.2  . Smokeless tobacco: Never Used  . Tobacco comment: occasional/ social smoker  Substance Use Topics  . Alcohol use: Yes    Comment: occasionally  . Drug use: No    Home Medications Prior to Admission medications     Medication Sig Start Date End Date Taking? Authorizing Provider  ibuprofen (ADVIL) 200 MG tablet Take 200 mg by mouth every 6 (six) hours as needed for moderate pain.   Yes [provider]  Multiple Vitamin (MULTIVITAMIN ADULT) TABS Take 1 tablet by mouth daily.   Yes [provider]    Allergies    Latex  Review of Systems   Review of Systems  Constitutional: Negative for activity change, chills and fever.  HENT: Negative for congestion.   Respiratory: Negative for shortness of breath.   Cardiovascular: Negative for chest pain and palpitations.  Gastrointestinal: Positive for abdominal pain. Negative for nausea and vomiting.  Genitourinary: Positive for vaginal bleeding. Negative for dysuria, vaginal discharge and vaginal pain.  Musculoskeletal: Negative for back pain, myalgias, neck pain and neck stiffness.  Skin: Negative for rash.  Allergic/Immunologic: Negative for immunocompromised state.  Neurological: Positive for light-headedness. Negative for dizziness, syncope, weakness and headaches.  Psychiatric/Behavioral: Negative for confusion.    Physical Exam Updated Vital Signs BP 114/65   Pulse 62   Temp 98.7 F (37.1 C) (Oral)   Resp 16   Ht 5\' 6"  (1.676 m)   Wt 90.7 kg   SpO2 100%   BMI 32.28 kg/m   Physical Exam Vitals and nursing note reviewed.  Constitutional:      General: She is not in acute distress.    Appearance: She is not ill-appearing, toxic-appearing or diaphoretic.     Comments: Well-appearing.  No acute distress.  HENT:     Head: Normocephalic.  Eyes:     Conjunctiva/sclera: Conjunctivae normal.  Cardiovascular:     Rate and Rhythm: Normal rate and regular rhythm.     Pulses: Normal pulses.     Heart sounds: Normal heart sounds. No murmur. No friction rub. No gallop.   Pulmonary:     Effort: Pulmonary effort is normal. No respiratory distress.     Breath sounds: No stridor. No wheezing, rhonchi or rales.  Chest:     Chest  wall: No tenderness.  Abdominal:     General: There is no distension.     Palpations: Abdomen is soft. There is no mass.     Tenderness: There is abdominal tenderness. There is no right CVA tenderness, left CVA tenderness, guarding or rebound.     Hernia: No hernia is present.     Comments: Mild tenderness palpation of bilateral lower abdomen, left greater than right.  No rebound or guarding.  Normoactive bowel sounds.  No tenderness over McBurney's point.  No CVA tenderness bilaterally.  Genitourinary:  Comments: Chaperoned exam.  Cervix is open.  There is a considerable amount of blood clots noted in the vaginal vault.  No adnexal tenderness or masses bilaterally.   Musculoskeletal:     Cervical back: Neck supple.     Right lower leg: No edema.     Left lower leg: No edema.  Skin:    General: Skin is warm.     Findings: No rash.  Neurological:     Mental Status: She is alert.  Psychiatric:        Behavior: Behavior normal.     ED Results / Procedures / Treatments   Labs (all labs ordered are listed, but only abnormal results are displayed) Labs Reviewed  URINALYSIS, ROUTINE W REFLEX MICROSCOPIC - Abnormal; Notable for the following components:      Result Value   APPearance CLOUDY (*)    Hgb urine dipstick LARGE (*)    Protein, ur 100 (*)    RBC / HPF >50 (*)    WBC, UA >50 (*)    All other components within normal limits  PREGNANCY, URINE - Abnormal; Notable for the following components:   Preg Test, Ur POSITIVE (*)    All other components within normal limits  CBC - Abnormal; Notable for the following components:   RBC 3.82 (*)    Hemoglobin 7.9 (*)    HCT 28.3 (*)    MCV 74.1 (*)    MCH 20.7 (*)    MCHC 27.9 (*)    RDW 18.9 (*)    All other components within normal limits  HCG, QUANTITATIVE, PREGNANCY - Abnormal; Notable for the following components:   hCG, Beta Chain, Quant, S 4,063 (*)    All other components within normal limits  COMPREHENSIVE METABOLIC  PANEL - Abnormal; Notable for the following components:   Potassium 3.4 (*)    Calcium 8.6 (*)    All other components within normal limits  WET PREP, GENITAL  TYPE AND SCREEN  ABO/RH  GC/CHLAMYDIA PROBE AMP ([place]) NOT AT [facility]    EKG None  Radiology US OB Comp < 14 Wks  Result Date: 12/27/2019 CLINICAL DATA:  Vaginal bleeding. Positive pregnancy test. Elective abortion in March. EXAM: OBSTETRIC <14 WK [place] AND TRANSVAGINAL OB US TECHNIQUE: Both transabdominal and transvaginal ultrasound examinations were performed for complete evaluation of the gestation as well as the maternal uterus, adnexal regions, and pelvic cul-de-sac. Transvaginal technique was performed to assess early pregnancy. COMPARISON:  10/16/2018 FINDINGS: Intrauterine gestational sac: Absent Yolk sac:  Absent Embryo:  Absent Cardiac Activity: Absent Subchorionic hemorrhage:  Absent Maternal uterus/adnexae: Peripherally hypervascular right ovarian cystic lesion of 2.9 cm could represent a corpus luteal cyst. The left ovary is normal.Heterogeneous endometrial thickening at up to 1.5 cm. IMPRESSION: 1. Absent intrauterine gestational sac, yolk sac, or fetal pole. Considerations include missed abortion, occult ectopic pregnancy, or early intrauterine pregnancy. 2. Right ovarian probable corpus luteal cyst. Electronically Signed   By: Jeronimo Greaves M.D.   On: 12/27/2019 06:30   US OB Transvaginal  Result Date: 12/27/2019 CLINICAL DATA:  Vaginal bleeding. Positive pregnancy test. Elective abortion in March. EXAM: OBSTETRIC <14 WK [place] AND TRANSVAGINAL OB US TECHNIQUE: Both transabdominal and transvaginal ultrasound examinations were performed for complete evaluation of the gestation as well as the maternal uterus, adnexal regions, and pelvic cul-de-sac. Transvaginal technique was performed to assess early pregnancy. COMPARISON:  10/16/2018 FINDINGS: Intrauterine gestational sac: Absent Yolk sac:  Absent Embryo:  Absent Cardiac  Activity: Absent  Subchorionic hemorrhage:  Absent Maternal uterus/adnexae: Peripherally hypervascular right ovarian cystic lesion of 2.9 cm could represent a corpus luteal cyst. The left ovary is normal.Heterogeneous endometrial thickening at up to 1.5 cm. IMPRESSION: 1. Absent intrauterine gestational sac, yolk sac, or fetal pole. Considerations include missed abortion, occult ectopic pregnancy, or early intrauterine pregnancy. 2. Right ovarian probable corpus luteal cyst. Electronically Signed   By: Jeronimo GreavesKyle  Talbot M.D.   On: 12/27/2019 06:30    Procedures Procedures (including critical care time)  Medications Ordered in ED Medications  methylergonovine (METHERGINE) injection 0.2 mg (0.2 mg Intramuscular Given 12/27/19 0735)    ED Course  I have reviewed the triage vital signs and the nursing notes.  Pertinent labs & imaging results that were available during my care of the patient were reviewed by me and considered in my medical decision making (see chart for details).    MDM Rules/Calculators/A&P                      34 year old female with a history of gastric sleeve, chlamydia, PCOS who presents to the emergency department with a chief complaint of vaginal bleeding.  Patient took misoprostol on 3/12 for chemical abortion. She was approximately [redacted] weeks pregnant. LMP ~08/31/2019.  She has been persistently spotting since that time until the last 2 days when she developed vaginal bleeding and was passing frequent blood clots and developed abdominal pain and cramping.  She did not have her hCG repeated/trended down after she was given the medication.  Patient is well-appearing and in no acute distress in the ER.  She is hemodynamically stable.  She is normotensive and has no tachycardia.  No peritoneal signs on abdominal exam.  Pelvic exam with open cervix and numerous clots in the vaginal vault.  Urine pregnancy test is positive.  Would have expected hCG to have cleared after more than a month  since taking misoprostol.  Quantitative hCG is 4063.There is concern for retained products of conception from a missed abortion, vaginal bleeding and new early pregnancy, ectopic pregnancy, or new spontaneous abortion.  The patient was discussed with Dr. Bebe ShaggyWickline, attending physician. Patient is unsure if she has had a menstrual cycle over the last 2 months that she has had persistent spotting.  She has had unprotected sexual intercourse over the last month.  She is not on any form of birth control.  She has no leukocytosis and has no constitutional symptoms.  Is also unremarkable.  Doubt septic abortion.  Pelvic ultrasound with heterogeneous endometrial thickening up to 1.5 cm.  There is no intrauterine gestational sac, yolk sac, or fetal pole.  Considerations include missed abortion, occult ectopic pregnancy, early intrauterine pregnancy.  There is also a right ovarian probable corpus luteal cyst.  OB/GYN was consulted and the patient's case was reviewed with Dr. Macon LargeAnyanwu.  Since the patient stable, she recommends a follow-up in the OB/GYN clinic when they reopen in 2 days to have repeat hCG testing.  The patient should receive a call from the clinic.  Given patient's hemoglobin of 7.9 with persistent vaginal bleeding, she recommends a dose of Methergine in the ER, which she has ordered.  This should significantly slow the patient's bleeding over the next few days.  She should also be given strict return precautions to go to the MAU if symptoms worsen.  Plan was discussed with the patient at bedside who is in agreement with the plan.  All questions answered. We will give the patient a work  note to ensure that she can make her appointments in the clinic on Monday for repeat hCG testing.  The patient was strongly advised to go to the MAU if symptoms worsen.  She is hemodynamically stable and in no acute distress.  Safe for discharge home with outpatient follow-up to OB/GYN.   Final Clinical Impression(s) /  ED Diagnoses Final diagnoses:  Vaginal bleeding  Right ovarian cyst    Rx / DC Orders ED Discharge Orders    None       Joanne Gavel, PA-C 12/27/19 2233    Ripley Fraise, MD 12/28/19 0157

## 2019-12-27 NOTE — ED Notes (Signed)
D/C'd to home with written and verbal instructions. Pt voiced understanding and is A/Ox3. Skin w/d/pink. Resp wnl, equal and non-labored. IV d/c'd and intact. Bleeding controlled. VSS. Sophie, PA is aware of pt's HGB and is fine to d/c pt to home at this time. Pt declined w/c. Gait steady. Ambulated out of ED independently and safely with belongings. NAD. No complaints voiced.

## 2019-12-27 NOTE — Discharge Instructions (Addendum)
Thank you for allowing me to care for you today in the Emergency Department.   You need to be seen in the OB/GYN clinic on Monday.  Their information is listed above.  You should receive a call from the office, but if you do not, please call their office first thing Monday morning.  They need to recheck your hCG level.  This needs to be rechecked until the level is 0.  Take 650 mg of Tylenol or 600 mg of ibuprofen with food every 6 hours for pain.  You can alternate between these 2 medications every 3 hours if your pain returns.  For instance, you can take Tylenol at noon, followed by a dose of ibuprofen at 3, followed by second dose of Tylenol and 6.   If you develop significantly worsening symptoms, including uncontrollable vaginal bleeding, if you pass out, severe shortness of breath, if you develop severe abdominal pain and fever, return to the ER.  I would recommend going to [facility] and going to the [facility] ER (Maternal Observation Unit).

## 2019-12-27 NOTE — ED Notes (Signed)
Ultrasound at bedside

## 2019-12-27 NOTE — ED Notes (Signed)
GC Chlamydia/ Wet Prep Collected by Frederik Pear PA-C

## 2019-12-29 ENCOUNTER — Ambulatory Visit (INDEPENDENT_AMBULATORY_CARE_PROVIDER_SITE_OTHER): Payer: Self-pay | Admitting: Clinical

## 2019-12-29 ENCOUNTER — Other Ambulatory Visit: Payer: Self-pay

## 2019-12-29 ENCOUNTER — Other Ambulatory Visit: Payer: Self-pay | Admitting: General Practice

## 2019-12-29 DIAGNOSIS — O0489 (Induced) termination of pregnancy with other complications: Secondary | ICD-10-CM

## 2019-12-29 DIAGNOSIS — F419 Anxiety disorder, unspecified: Secondary | ICD-10-CM

## 2019-12-29 LAB — GC/CHLAMYDIA PROBE AMP (~~LOC~~) NOT AT ARMC
Chlamydia: NEGATIVE
Comment: NEGATIVE
Comment: NORMAL
Neisseria Gonorrhea: NEGATIVE

## 2019-12-29 NOTE — BH Specialist Note (Signed)
Integrated Behavioral Health Initial Visit  MRN: [id number] Name: Crystal [name]  Number of Integrated Behavioral Health Clinician visits:: 1/6 Session Start time: 2:05  Session End time: 2:18 Total time: 13  Type of Service: Integrated Behavioral Health- Individual/Family Interpretor:No. Interpretor Name and Language: n/a   Warm Hand Off Completed.       SUBJECTIVE: Crystal [name] is a 34 y.o. female accompanied by n/a Patient was referred by Scheryl Darter, MD for positive depression screen. Patient reports the following symptoms/concerns: Pt primary symptoms are sleeping/eating issues, anxiety, worry, irritability and dread; requests follow up virtual appointment.  Duration of problem: Undetermined; Severity of problem: moderately severe  OBJECTIVE: Mood: Anxious and Affect: Appropriate Risk of harm to self or others: No plan to harm self or others  LIFE CONTEXT: Family and Social: - School/Work: - Self-Care: - Life Changes: -  GOALS ADDRESSED: Patient will: 1. Reduce symptoms of: anxiety and depression 2. Demonstrate ability to: Increase healthy adjustment to current life circumstances  INTERVENTIONS: Interventions utilized: Psychoeducation and/or Health Education and Link to [employer]  Standardized Assessments completed: GAD-7 and PHQ 9  ASSESSMENT: Patient currently experiencing Anxiety disorder, unspecified   Patient may benefit from psychoeducation and brief therapeutic interventions regarding coping with symptoms of anxiety, depression, and current life stress   PLAN: 1. Follow up with behavioral health clinician on : One week 2. Behavioral recommendations:  -Continue with plan to attend initial couple counseling appointment next week -Consider apps, as discussed, for additional self-care 3. Referral(s): Integrated Art gallery manager (In Clinic) and [employer]:  [facility] resources 4. "From scale of 1-10, how likely are you to follow  plan?": -  Rae Lips, LCSW  Depression screen [facility] 2/9 12/29/2019 10/02/2018 05/17/2017 04/03/2017 03/27/2017  Decreased Interest 2 1 0 0 0  Down, Depressed, Hopeless 2 0 0 0 0  PHQ - 2 Score 4 1 0 0 0  Altered sleeping 3 2 - - -  Tired, decreased energy 2 1 - - -  Change in appetite 3 0 - - -  Feeling bad or failure about yourself  0 0 - - -  Trouble concentrating 2 0 - - -  Moving slowly or fidgety/restless 0 0 - - -  Suicidal thoughts 0 0 - - -  PHQ-9 Score 14 4 - - -  Difficult doing work/chores - - - - -   GAD 7 : Generalized Anxiety Score 12/29/2019 10/02/2018 11/24/2016 11/16/2016  Nervous, Anxious, on Edge 3 2 3 3   Control/stop worrying 3 2 3 3   Worry too much - different things 3 2 3 3   Trouble relaxing 2 2 3 3   Restless 2 0 3 3  Easily annoyed or irritable 3 2 2 3   Afraid - awful might happen 3 2 3 3   Total GAD 7 Score 19 12 20 21   Anxiety Difficulty - - Very difficult Very difficult

## 2019-12-29 NOTE — BH Specialist Note (Signed)
Pt did not arrive to video visit and did not answer the phone ; Left HIPPA-compliant message to call back Asher Muir from [facility] at [phone].  ; left MyChart message for patient.   Integrated Behavioral Health via Telemedicine Video Visit  12/29/2019 KHALA TARTE [phone]   Rae Lips

## 2019-12-29 NOTE — Patient Instructions (Signed)
 /Emotional Wellbeing Apps and Websites Here are a few free apps meant to help you to help yourself.  To find, try searching on the internet to see if the app is offered on Apple/Android devices. If your first choice doesn't come up on your device, the good news is that there are many choices! Play around with different apps to see which ones are helpful to you.    Calm This is an app meant to help increase calm feelings. Includes info, strategies, and tools for tracking your feelings.      Calm Harm  This app is meant to help with self-harm. Provides many 5-minute or 15-min coping strategies for doing instead of hurting yourself.       Healthy Minds Health Minds is a problem-solving tool to help deal with emotions and cope with stress you encounter wherever you are.      MindShift This app can help people cope with anxiety. Rather than trying to avoid anxiety, you can make an important shift and face it.      MY3  MY3 features a support system, safety plan and resources with the goal of offering a tool to use in a time of need.       My Life My Voice  This mood journal offers a simple solution for tracking your thoughts, feelings and moods. Animated emoticons can help identify your mood.       Relax Melodies Designed to help with sleep, on this app you can mix sounds and meditations for relaxation.      Smiling Mind Smiling Mind is meditation made easy: it's a simple tool that helps put a smile on your mind.        Stop, Breathe & Think  A friendly, simple guide for people through meditations for mindfulness and compassion.  Stop, Breathe and Think Kids Enter your current feelings and choose a "mission" to help you cope. Offers videos for certain moods instead of just sound recordings.       Team Orange The goal of this tool is to help teens change how they think, act, and react. This app helps you focus on your own good feelings and experiences.      The Virtual Hope Box The Virtual Hope Box (VHB) contains simple tools to help patients with coping, relaxation, distraction, and positive thinking.    Behavioral Health Resources:   What if I or someone I know is in crisis?  . If you are thinking about harming yourself or having thoughts of suicide, or if you know someone who is, seek help right away.  . Call your doctor or mental health care provider.  . Call 911 or go to a hospital emergency room to get immediate help, or ask a friend or family member to help you do these things.  . Call the [place] National Suicide Prevention Lifeline's toll-free, 24-hour hotline at 1-800-273-TALK (1-800-273-8255) or TTY: 1-800-799-4 TTY (1-800-799-4889) to talk to a trained counselor.  . If you are in crisis, make sure you are not left alone.   . If someone else is in crisis, make sure he or she is not left alone   24 Hour :   [place] National Suicide Hotline: 1-800-273-8255  Therapeutic Alternative Mobile Crisis: 1-877-626-1772   [facility]  [address], [place], [place] [address]  800-711-2635 or 336-832-9700  [facility] Crisis Line (Domestic Violence, Rape & Victim Assistance)  336-273-7273  [facility]  [address] [place], [place]  [address]   1-855-788-8787 or 336-676-6840   [facility] [place] [facility]: 336-899-1505 (8am-4pm) or 1-866- 261-5769 (after hours)           [facility], [address], [place], [place]  1-800-711-2635 Fax: 336-832-9701 [email or web address]  *Interpreters available *Accepts Medicaid, Medicare, uninsured  [facility]   Mon-Fri: 8am-5pm [address], [employer], [place] [phone](phone); 336-272-9885(fax) www.[email or web address]  *Accepts Medicare  [facility] Mon, Tues, Thurs, Fri: 8am-4pm [address], [place], [place]  336-334-5000 (phone); 336-256-0121 (fax) www.[facility].com  *Accepts Medicare  [facility] Mon-Fri: 9am-5pm  [address], [place], [place] 336-540-9400 (phone); 336-540-9454  www.[email or web address]  *Accepts Medicaid  [facility] [address], [place], [place]  336-271-5888  [email or web address]://[email or web address]   [facility] Mon-Fri, 8:30am-12pm/1pm-2:30pm [address], [place], [place] 336-387-6161 (phone); 336-387-9167 (fax) www.fspcares.org  *Accepts Medicaid, sliding-scale*Bilingual services available  [facility] Mon-Fri, 8am-7pm [address], [place], [place]  336-899-8800(phone); 336-899-8811(fax) www.f[facility]olutions.org  *Accepts Medicaid *Bilingual services available  [facility] Mon-Fri: 8am-5pm, Saturday by appointment only [address], [place], [place] [phone] (phone); 336-292-6711 (fax) www.[email or web address]   [facility] [address], [place], [place] [phone] www.[email or web address]  *Free & reduced services for uninsured and underinsured individuals *Bilingual services for Spanish-speaking clients 21 and under  [facility], [address], [place], [place] 336-676-6409(phone); 336-676-6409(fax) www.[facility].org  *Bring your own interpreter at first visit *Accepts Medicare and Medicaid  [facility] Mon-Fri: 9am-5:30pm [address], [place], [place] [phone] (phone), [phone] (fax) After hours crisis line: 336-763-1165 www.[facility].com  *Accepts Medicare and Medicaid  [facility] Mon-Thurs, 8am-6pm [address], [place], [place]  [phone] (phone); 336-288-0738 (fax) http://[facility].org    *Subsidized costs available  [facility] Mon-Fri: 8am-4pm [address], [place], [place] [phone](phone); 336-834-9698(fax) www.[facility]othe[facility]ices.com  *Accepts Medicaid  [facility] Same day access hours: Mon-Fri, 8:30-3pm Crisis hours: Mon-Fri, 8am-5pm [address], [place], [place]  [facility] Same day access hours: Mon-Fri, 8:30-3pm Crisis hours: Mon-Fri, 8am-8pm [address], , [place] 336-899-1505 (phone); 336-899-1513 (fax) www.[facility].org  *Accepts Medicaid and Medicare  [facility] Mon, Wed, Fri: 9am-9pm Tues, Thurs: 9am-6pm [address], [place], [place]  [phone] (phone); [phone] (fax) https://[email or web address]  *(Accepts Medicare and Medicaid; payment plans available)*Bilingual services available  [facility] [address], [place], [place] [phone] (phone); [phone] (fax) www.[facility].com   [facility] [address], [place], [place]  [phone]  www.[email or web address]  *Bilingual services available  [facility] Mon-Thurs: 8am-8pm [address], [place], [place] [phone] (phone); [phone] (fax) [email or web address]://[email or web address]/[email or web address]  *Accepts Medicaid*Bilingual services available  [facility] [address], [place], [place] [phone] (phone) https://[email or web address]  *Accepts Medicaid *Bilingual services available  [facility] Mon-Fri, 9am-4:45pm [address], [place], [place] [phone] (phone); [phone] (fax) http://[email or web address]-[email or web address]  *Accepts Medicare  [facility] Mon-Thurs: 8:30-8pm, Fri: 8:30am-7pm [address], [place], [place] (3rd floor) [phone] (phone); [phone] (fax) http://[email or web address]  *Accepts Medicaid; income-based  reduced rates available  [facility] Mon-Fri: 8am-5pm [address], [place],  [phone] (phone); [phone] (fax) http://www.[email or web address]  *Accepts Medicaid*Bilingual services available  [facility] [address], [place],   [phone] (phone); [phone] (fax) www.[facility].org  *Free emergency housing and clinical services for   youth in crisis  [facility] [place]  [address], [place] 336-373-1402 www.mhag.org  *Provides direct services to individuals in recovery from mental illness, including support groups, recovery skills classes, and one on one peer support  NAMI (National Alliance on Mental Illness) [place] NAMI helpline: 336-370-4264  https://namiguilford.org  *A community hub for information relating to local resources and services for the friends and families of individuals living alongside a mental health condition, as well as the individuals themselves. Classes and support groups also provided     

## 2019-12-30 ENCOUNTER — Telehealth (INDEPENDENT_AMBULATORY_CARE_PROVIDER_SITE_OTHER): Payer: Self-pay

## 2019-12-30 DIAGNOSIS — O0489 (Induced) termination of pregnancy with other complications: Secondary | ICD-10-CM

## 2019-12-30 LAB — BETA HCG QUANT (REF LAB): hCG Quant: 1895 m[IU]/mL

## 2019-12-30 MED ORDER — NAPROXEN 500 MG PO TABS: 500.0000 mg | ORAL_TABLET | Freq: Two times a day (BID) | ORAL | 0 refills | Status: DC

## 2019-12-30 NOTE — Telephone Encounter (Addendum)
-----   Message from Tereso Newcomer, MD sent at 12/30/2019 11:45 AM EDT ----- Falling HCG values consistent with resolving pregnancy after misoprostol termination. Please call to inform patient of results and recommendations. Results were also released to MyChart and patient was given recommendations as indicated.   Per chart review MyChart message from [facility] MD was read by pt. Called pt to follow up. Pt asks when she can expect her vaginal bleeding to stop; explained that her pregnancy continues to resolve as expected but we are unable to know when her bleeding will stop. Pt reports abdominal cramping and requests prescription for pain medicine. Jerilynn Birkenhead, MD gives verbal order for Naproxen 500 mg BID for 5 days. Rx sent to pt's preferred pharmacy. Offered pt a follow-up visit to discuss contraception. Pt reports insurance will be active on 01/03/20. Explained to pt she can call or send a MyChart message for a birth control appt once her insurance is active.

## 2020-01-12 ENCOUNTER — Ambulatory Visit: Payer: Self-pay | Admitting: Clinical

## 2020-01-12 DIAGNOSIS — Z5329 Procedure and treatment not carried out because of patient's decision for other reasons: Secondary | ICD-10-CM

## 2020-01-12 DIAGNOSIS — Z91199 Patient's noncompliance with other medical treatment and regimen due to unspecified reason: Secondary | ICD-10-CM

## 2020-01-28 ENCOUNTER — Encounter (HOSPITAL_COMMUNITY): Payer: Self-pay

## 2020-05-12 ENCOUNTER — Ambulatory Visit (INDEPENDENT_AMBULATORY_CARE_PROVIDER_SITE_OTHER): Payer: 59 | Admitting: Lactation Services

## 2020-05-12 ENCOUNTER — Other Ambulatory Visit: Payer: Self-pay

## 2020-05-12 DIAGNOSIS — Z3201 Encounter for pregnancy test, result positive: Secondary | ICD-10-CM | POA: Diagnosis not present

## 2020-05-12 LAB — POCT PREGNANCY, URINE: Preg Test, Ur: POSITIVE — AB

## 2020-05-12 MED ORDER — PRENATAL PLUS 27-1 MG PO TABS: 1.0000 | ORAL_TABLET | Freq: Every day | ORAL | 11 refills | Status: DC

## 2020-05-12 NOTE — Progress Notes (Signed)
Established patient dropped off urine for Pregnancy test. Patient UPT + LMP 03/30/20 with EDD 01/04/2021 making patient 6 weeks 1 day pregnant.   Patient reports she is feeling well. She is not taking PNV, advised to begin taking and prescription sent to pharmacy.   Patient with no questions or concerns at this time. Message to front office to call and schedule new OB intake and first PNV visit.

## 2020-05-16 ENCOUNTER — Other Ambulatory Visit: Payer: Self-pay

## 2020-05-16 ENCOUNTER — Inpatient Hospital Stay (HOSPITAL_COMMUNITY)
Admission: AD | Admit: 2020-05-16 | Discharge: 2020-05-16 | Disposition: A | Discharge status: Home / Self Care | Payer: 59 | Attending: Obstetrics and Gynecology | Admitting: Obstetrics and Gynecology

## 2020-05-16 ENCOUNTER — Encounter (HOSPITAL_COMMUNITY): Payer: Self-pay

## 2020-05-16 ENCOUNTER — Inpatient Hospital Stay (HOSPITAL_COMMUNITY): Payer: 59

## 2020-05-16 DIAGNOSIS — O23591 Infection of other part of genital tract in pregnancy, first trimester: Secondary | ICD-10-CM | POA: Insufficient documentation

## 2020-05-16 DIAGNOSIS — Z87891 Personal history of nicotine dependence: Secondary | ICD-10-CM | POA: Diagnosis not present

## 2020-05-16 DIAGNOSIS — O209 Hemorrhage in early pregnancy, unspecified: Secondary | ICD-10-CM | POA: Diagnosis present

## 2020-05-16 DIAGNOSIS — B9689 Other specified bacterial agents as the cause of diseases classified elsewhere: Secondary | ICD-10-CM | POA: Insufficient documentation

## 2020-05-16 DIAGNOSIS — O200 Threatened abortion: Secondary | ICD-10-CM | POA: Diagnosis not present

## 2020-05-16 DIAGNOSIS — N760 Acute vaginitis: Secondary | ICD-10-CM | POA: Diagnosis not present

## 2020-05-16 DIAGNOSIS — O99891 Other specified diseases and conditions complicating pregnancy: Secondary | ICD-10-CM

## 2020-05-16 DIAGNOSIS — O99281 Endocrine, nutritional and metabolic diseases complicating pregnancy, first trimester: Secondary | ICD-10-CM | POA: Insufficient documentation

## 2020-05-16 DIAGNOSIS — Z791 Long term (current) use of non-steroidal anti-inflammatories (NSAID): Secondary | ICD-10-CM | POA: Insufficient documentation

## 2020-05-16 DIAGNOSIS — Z3A01 Less than 8 weeks gestation of pregnancy: Secondary | ICD-10-CM | POA: Diagnosis not present

## 2020-05-16 DIAGNOSIS — E282 Polycystic ovarian syndrome: Secondary | ICD-10-CM | POA: Insufficient documentation

## 2020-05-16 LAB — WET PREP, GENITAL
Sperm: NONE SEEN
Trich, Wet Prep: NONE SEEN
Yeast Wet Prep HPF POC: NONE SEEN

## 2020-05-16 LAB — URINALYSIS, ROUTINE W REFLEX MICROSCOPIC
Bacteria, UA: NONE SEEN
Bilirubin Urine: NEGATIVE
Glucose, UA: NEGATIVE mg/dL
Ketones, ur: NEGATIVE mg/dL
Leukocytes,Ua: NEGATIVE
Nitrite: NEGATIVE
Protein, ur: NEGATIVE mg/dL
Specific Gravity, Urine: 1.02 (ref 1.005–1.030)
pH: 7 (ref 5.0–8.0)

## 2020-05-16 LAB — CBC
HCT: 27.6 % — ABNORMAL LOW (ref 36.0–46.0)
Hemoglobin: 7.3 g/dL — ABNORMAL LOW (ref 12.0–15.0)
MCH: 16.9 pg — ABNORMAL LOW (ref 26.0–34.0)
MCHC: 26.4 g/dL — ABNORMAL LOW (ref 30.0–36.0)
MCV: 63.7 fL — ABNORMAL LOW (ref 80.0–100.0)
Platelets: 393 10*3/uL (ref 150–400)
RBC: 4.33 MIL/uL (ref 3.87–5.11)
RDW: 22 % — ABNORMAL HIGH (ref 11.5–15.5)
WBC: 5.1 10*3/uL (ref 4.0–10.5)
nRBC: 0 % (ref 0.0–0.2)

## 2020-05-16 LAB — COMPREHENSIVE METABOLIC PANEL
ALT: 16 U/L (ref 0–44)
AST: 21 U/L (ref 15–41)
Albumin: 3.4 g/dL — ABNORMAL LOW (ref 3.5–5.0)
Alkaline Phosphatase: 35 U/L — ABNORMAL LOW (ref 38–126)
Anion gap: 6 (ref 5–15)
BUN: 8 mg/dL (ref 6–20)
CO2: 24 mmol/L (ref 22–32)
Calcium: 8.9 mg/dL (ref 8.9–10.3)
Chloride: 108 mmol/L (ref 98–111)
Creatinine, Ser: 0.87 mg/dL (ref 0.44–1.00)
GFR calc Af Amer: >60 mL/min (ref >60)
GFR calc non Af Amer: >60 mL/min (ref >60)
Glucose, Bld: 93 mg/dL (ref 70–99)
Potassium: 4 mmol/L (ref 3.5–5.1)
Sodium: 138 mmol/L (ref 135–145)
Total Bilirubin: 0.6 mg/dL (ref 0.3–1.2)
Total Protein: 6.6 g/dL (ref 6.5–8.1)

## 2020-05-16 LAB — HCG, QUANTITATIVE, PREGNANCY: hCG, Beta Chain, Quant, S: 250 m[IU]/mL — ABNORMAL HIGH (ref <5)

## 2020-05-16 MED ORDER — METRONIDAZOLE 500 MG PO TABS: 500.0000 mg | ORAL_TABLET | Freq: Two times a day (BID) | ORAL | 0 refills | Status: DC

## 2020-05-16 NOTE — MAU Note (Signed)
Crystal [name] is a 34 y.o. at [redacted]w[redacted]d here in MAU reporting: started bleeding yesterday after sex. States she thinks she passed some tissue. Bleeding is light today, wearing a pad but hasnt seen any bleeding on there. Having lower back pain.   LMP: 03/30/20  Onset of complaint: yesterday  Pain score: 7/10  Vitals:   05/16/20 0949  BP: (!) 143/68  Pulse: 80  Resp: 16  Temp: 98.7 F (37.1 C)  SpO2: 97%     Lab orders placed from triage: UA

## 2020-05-16 NOTE — MAU Provider Note (Signed)
History     CSN: [id number]  Arrival date and time: 05/16/20 7782   First Provider Initiated Contact with Patient 05/16/20 1110      Chief Complaint  Patient presents with  . Back Pain  . Vaginal Bleeding   Crystal [name] is a 34 y.o. year old [phone] female at [redacted]w[redacted]d weeks gestation who presents to MAU reporting postcoital bleeding last night.  She thinks she passed some tissue last night.  She reports the vaginal bleeding is light today and only wearing a pad but has not seen any blood on it.  She also reports low back pain.  [facility] and her new OB visit on October 7.   OB History    Gravida  5   Para  2   Term  2   Preterm  0   AB  2   Living  2     SAB  2   TAB      Ectopic      Multiple  0   Live Births  2           Past Medical History:  Diagnosis Date  . Anemia   . Anxiety   . Depression   . H/O varicella   . History of gestational hypertension    2013--- PIH  . Hx of chlamydia infection   . PCOS (polycystic ovarian syndrome)    No radiological confirmation  . S/P gastric bypass 07/10/2017  . Wears glasses     Past Surgical History:  Procedure Laterality Date  . CESAREAN SECTION  08/18/2012   Procedure: CESAREAN SECTION;x 1  Surgeon: Tresa Endo A. Ernestina Penna, MD;  Location: [facility];  Service: Obstetrics;  Laterality: N/A;  Primary Cesarean Section   . CESAREAN SECTION N/A 05/21/2015   Procedure: CESAREAN SECTION;  Surgeon: Tereso Newcomer, MD;  Location: [facility];  Service: Obstetrics;  Laterality: N/A;  Requested 05-21-15 @ 1:15p  . LAPAROSCOPIC GASTRIC SLEEVE RESECTION N/A 07/10/2017   Procedure: LAPAROSCOPIC GASTRIC SLEEVE RESECTION WITH UPPER ENDO;  Surgeon: Berna Bue, MD;  Location: [facility];  Service: General;  Laterality: N/A;    Family History  Problem Relation Age of Onset  . Heart disease Mother   . Hypertension Mother   . Diabetes Mother   . Asthma Mother    . Heart disease Father   . Hypertension Father   . Diabetes Father   . Asthma Father   . Diabetes Maternal Grandmother   . Hypertension Maternal Grandmother   . Cancer Maternal Grandmother        urethral, lung  . Stroke Other     Social History   Tobacco Use  . Smoking status: Former Smoker    Types: Cigarettes    Quit date: 09/16/2013    Years since quitting: 6.6  . Smokeless tobacco: Never Used  . Tobacco comment: occasional/ social smoker  Vaping Use  . Vaping Use: Never used  Substance Use Topics  . Alcohol use: Yes    Comment: occasionally  . Drug use: No    Allergies:  Allergies  Allergen Reactions  . Latex Itching    Medications Prior to Admission  Medication Sig Dispense Refill Last Dose  . ibuprofen (ADVIL) 200 MG tablet Take 200 mg by mouth every 6 (six) hours as needed for moderate pain.     . naproxen (NAPROSYN) 500 MG tablet Take  1 tablet (500 mg total) by mouth 2 (two) times daily with a meal. 10 tablet 0   . prenatal vitamin w/FE, FA (PRENATAL 1 + 1) 27-1 MG TABS tablet Take 1 tablet by mouth daily at 12 noon. 30 tablet 11     Review of Systems  Constitutional: Negative.   HENT: Negative.   Eyes: Negative.   Respiratory: Negative.   Cardiovascular: Negative.   Gastrointestinal: Negative.   Endocrine: Negative.   Genitourinary: Positive for pelvic pain and vaginal bleeding ("light VB" only wearing a pad, but not seen any VB today).  Musculoskeletal: Negative.   Skin: Negative.   Allergic/Immunologic: Negative.   Neurological: Negative.   Hematological: Negative.   Psychiatric/Behavioral: Negative.    Physical Exam   Blood pressure (!) 143/68, pulse 80, temperature 98.7 F (37.1 C), temperature source Oral, resp. rate 16, height 5\' 6"  (1.676 m), weight 92.4 kg, last menstrual period 03/30/2020, SpO2 97 %, unknown if currently breastfeeding.  Physical Exam Vitals and nursing note reviewed. Exam conducted with a chaperone present.   Constitutional:      Appearance: Normal appearance. She is obese.  HENT:     Head: Normocephalic and atraumatic.  Cardiovascular:     Rate and Rhythm: Normal rate.     Pulses: Normal pulses.  Pulmonary:     Effort: Pulmonary effort is normal.  Abdominal:     General: Abdomen is flat.  Genitourinary:    Comments: Uterus: non-tender, SE: cervix is smooth, pink, no lesions, large amt of bright, red blood in vaginal vault -- WP, GC/CT done, closed/long/firm, no CMT or friability, no adnexal tenderness  Musculoskeletal:     Cervical back: Normal range of motion.  Neurological:     Mental Status: She is alert and oriented to person, place, and time.  Psychiatric:        Mood and Affect: Mood normal.        Behavior: Behavior normal.        Thought Content: Thought content normal.        Judgment: Judgment normal.     MAU Course  Procedures  MDM [facility] UPT CBC ABO/Rh HCG Wet Prep GC/CT -- pending HIV -- pending OB < 14 wks 04/01/2020 with TV  Results for orders placed or performed during the hospital encounter of 05/16/20 (from the past 24 hour(s))  Urinalysis, Routine w reflex microscopic Urine, Clean Catch     Status: Abnormal   Collection Time: 05/16/20 10:02 AM  Result Value Ref Range   Color, Urine YELLOW YELLOW   APPearance CLEAR CLEAR   Specific Gravity, Urine 1.020 1.005 - 1.030   pH 7.0 5.0 - 8.0   Glucose, UA NEGATIVE NEGATIVE mg/dL   Hgb urine dipstick MODERATE (A) NEGATIVE   Bilirubin Urine NEGATIVE NEGATIVE   Ketones, ur NEGATIVE NEGATIVE mg/dL   Protein, ur NEGATIVE NEGATIVE mg/dL   Nitrite NEGATIVE NEGATIVE   Leukocytes,Ua NEGATIVE NEGATIVE   RBC / HPF 6-10 0 - 5 RBC/hpf   WBC, UA 0-5 0 - 5 WBC/hpf   Bacteria, UA NONE SEEN NONE SEEN   Squamous Epithelial / LPF 0-5 0 - 5   Mucus PRESENT   CBC     Status: Abnormal   Collection Time: 05/16/20 10:08 AM  Result Value Ref Range   WBC 5.1 4.0 - 10.5 K/uL   RBC 4.33 3.87 - 5.11 MIL/uL   Hemoglobin 7.3 (L) 12.0  - 15.0 g/dL   HCT 07/16/20 (L) 36 - 46 %  MCV 63.7 (L) 80.0 - 100.0 fL   MCH 16.9 (L) 26.0 - 34.0 pg   MCHC 26.4 (L) 30.0 - 36.0 g/dL   RDW 92.1 (H) 19.4 - 17.4 %   Platelets 393 150 - 400 K/uL   nRBC 0.0 0.0 - 0.2 %  Comprehensive metabolic panel     Status: Abnormal   Collection Time: 05/16/20 10:08 AM  Result Value Ref Range   Sodium 138 135 - 145 mmol/L   Potassium 4.0 3.5 - 5.1 mmol/L   Chloride 108 98 - 111 mmol/L   CO2 24 22 - 32 mmol/L   Glucose, Bld 93 70 - 99 mg/dL   BUN 8 6 - 20 mg/dL   Creatinine, Ser 0.81 0.44 - 1.00 mg/dL   Calcium 8.9 8.9 - 44.8 mg/dL   Total Protein 6.6 6.5 - 8.1 g/dL   Albumin 3.4 (L) 3.5 - 5.0 g/dL   AST 21 15 - 41 U/L   ALT 16 0 - 44 U/L   Alkaline Phosphatase 35 (L) 38 - 126 U/L   Total Bilirubin 0.6 0.3 - 1.2 mg/dL   GFR calc non Af Amer >60 >60 mL/min   GFR calc Af Amer >60 >60 mL/min   Anion gap 6 5 - 15  hCG, quantitative, pregnancy     Status: Abnormal   Collection Time: 05/16/20 10:08 AM  Result Value Ref Range   hCG, Beta Chain, Quant, S 250 (H) <5 mIU/mL  Wet prep, genital     Status: Abnormal   Collection Time: 05/16/20 11:15 AM   Specimen: Cervix  Result Value Ref Range   Yeast Wet Prep HPF POC NONE SEEN NONE SEEN   Trich, Wet Prep NONE SEEN NONE SEEN   Clue Cells Wet Prep HPF POC PRESENT (A) NONE SEEN   WBC, Wet Prep HPF POC FEW (A) NONE SEEN   Sperm NONE SEEN     US OB LESS THAN 14 WEEKS WITH OB TRANSVAGINAL  Result Date: 05/16/2020 CLINICAL DATA:  Bleeding. Quantitative beta HCG 250. EXAM: OBSTETRIC <14 WK [place] AND TRANSVAGINAL OB US TECHNIQUE: Both transabdominal and transvaginal ultrasound examinations were performed for complete evaluation of the gestation as well as the maternal uterus, adnexal regions, and pelvic cul-de-sac. Transvaginal technique was performed to assess early pregnancy. COMPARISON:  None. FINDINGS: Intrauterine gestational sac: None Yolk sac:  Not Visualized. Embryo:  Not Visualized. Maternal  uterus/adnexae: Right ovary measures 4.0 x 2.2 x 2.3 cm, for a volume of 10.9 mL. Left ovary measures 4.6 x 4.4 x 2.8 cm for volume of 14.7 mL. There is a 3.3 cm probable corpus luteal cyst on the left ovary. The endometrium measures 6.4 mm in thickness. IMPRESSION: No intrauterine gestational sac or embryo visualized. Findings are suspicious but not yet definitive for failed pregnancy. Recommend follow-up US in 10-14 days for definitive diagnosis. This recommendation follows SRU consensus guidelines: Diagnostic Criteria for Nonviable Pregnancy Early in the First Trimester. Malva Limes Med 2013; 185:6314-97. Electronically Signed   By: Ted Mcalpine M.D.   On: 05/16/2020 12:16     Assessment and Plan  1. Vaginal bleeding affecting early pregnancy - Return to MAU:  If you have heavier bleeding that soaks through more that 2 pads per hour for an hour or more  If you bleed so much that you feel like you might pass out or you do pass out  If you have significant abdominal pain that is not improved with Tylenol 1000 mg every 6 hours  as needed for pain  If you develop a fever > 100.5  2. Threatened miscarriage in early pregnancy - Information provided on threatened miscarriage - Discussed with patient that VB was concerning for a miscarriage or an impending miscarriage  - Discussed low HCG level and need to repeat in 48 hours. Explained in a normal pregnancy HCG will double every 48 hrs.  - [facility] at 8:30 AM  3. Bacterial vaginosis - Information provided on BV - Rx for Flagyl 500 mg BIC x 7 days sent to pharmacy on file   - Discharge patient - Keep scheduled appts  - Patient verbalized an understanding of the plan of care and agrees.     Raelyn Moraolitta Sherie Dobrowolski, MSN, CNM 05/16/2020, 11:22 AM

## 2020-05-16 NOTE — Discharge Instructions (Signed)
Return to MAU:  If you have heavier bleeding that soaks through more that 2 pads per hour for an hour or more  If you bleed so much that you feel like you might pass out or you do pass out  If you have significant abdominal pain that is not improved with Tylenol 1000 mg every 6 hours as needed for pain  If you develop a fever > 100.5   

## 2020-05-18 ENCOUNTER — Other Ambulatory Visit: Payer: Self-pay

## 2020-05-18 ENCOUNTER — Ambulatory Visit (INDEPENDENT_AMBULATORY_CARE_PROVIDER_SITE_OTHER): Payer: 59 | Admitting: *Deleted

## 2020-05-18 VITALS — BP 112/67 | HR 67 | Ht 66.0 in | Wt 199.7 lb

## 2020-05-18 DIAGNOSIS — O039 Complete or unspecified spontaneous abortion without complication: Secondary | ICD-10-CM | POA: Diagnosis not present

## 2020-05-18 DIAGNOSIS — O3680X0 Pregnancy with inconclusive fetal viability, not applicable or unspecified: Secondary | ICD-10-CM | POA: Diagnosis not present

## 2020-05-18 LAB — GC/CHLAMYDIA PROBE AMP (~~LOC~~) NOT AT ARMC
Chlamydia: NEGATIVE
Comment: NEGATIVE
Comment: NORMAL
Neisseria Gonorrhea: NEGATIVE

## 2020-05-18 LAB — BETA HCG QUANT (REF LAB): hCG Quant: 87 m[IU]/mL

## 2020-05-18 NOTE — Progress Notes (Signed)
Here for stat bhcg. States having very light bleeding- wearing a panti liner which is not requiring to be changed. States having back pain that " she has always had for years" states is 4.Explained we will draw stat bhcg and have her leave and then when  Results are back in about 2 hours I will discuss with provider and then I will call her . She voices understanding.  Dayzee Trower,RN

## 2020-05-18 NOTE — Progress Notes (Signed)
Patient was assessed and managed by nursing staff during this encounter. I have reviewed the chart and agree with the documentation and plan. I have also made any necessary editorial changes.  Pleasant Britz DNP, CNM  05/18/20  3:06 PM    

## 2020-05-18 NOTE — Progress Notes (Signed)
Stat BHCG results back. Reviewed with Mathews Robinsons, CNM as [address] not available. Level dropped to 87; per provider indicative of miscarriage.  Called [place] and informed her of result and plan per provider non stat bhcg in one week and weekly until less than 5; also 2 week sab fu.  I explained registrar will call with appt. She voices understanding. Oluwademilade Kellett,RN

## 2020-05-25 ENCOUNTER — Other Ambulatory Visit: Payer: Self-pay

## 2020-05-25 ENCOUNTER — Other Ambulatory Visit: Payer: 59

## 2020-05-25 DIAGNOSIS — O039 Complete or unspecified spontaneous abortion without complication: Secondary | ICD-10-CM

## 2020-05-26 LAB — BETA HCG QUANT (REF LAB): hCG Quant: 5 m[IU]/mL

## 2020-06-08 ENCOUNTER — Other Ambulatory Visit: Payer: Self-pay

## 2020-06-08 ENCOUNTER — Encounter: Payer: Self-pay | Admitting: Certified Nurse Midwife

## 2020-06-08 ENCOUNTER — Ambulatory Visit (INDEPENDENT_AMBULATORY_CARE_PROVIDER_SITE_OTHER): Payer: 59 | Admitting: Certified Nurse Midwife

## 2020-06-08 VITALS — BP 109/71 | HR 96 | Ht 66.0 in | Wt 197.4 lb

## 2020-06-08 DIAGNOSIS — N96 Recurrent pregnancy loss: Secondary | ICD-10-CM | POA: Diagnosis not present

## 2020-06-08 DIAGNOSIS — O039 Complete or unspecified spontaneous abortion without complication: Secondary | ICD-10-CM | POA: Diagnosis not present

## 2020-06-08 MED ORDER — MEDROXYPROGESTERONE ACETATE 150 MG/ML IM SUSP: 150.0000 mg | Freq: Once | INTRAMUSCULAR | Status: DC

## 2020-06-08 NOTE — Progress Notes (Signed)
   Subjective:   Patient Name: Crystal [name], female   DOB: 1986-01-25, 34 y.o.  MRN: [id number]  HPI Seen for follow up of SAB 2 weeks ago. Denies bleeding, cramping. Menses has not returned yet. Plans on becoming pregnant soon.  This is was the patient's 4th miscarriage, the 2nd this year. She has two children that required clomid for conception. After their births, subsequent pregnancies have not made it past 8wks of development.   Review of Systems: Pertinent symptoms listed in HPI, otherwise a comprehensive review of systems was negative.    Objective:   Physical Exam  Constitutional: She is oriented to person, place, and time. She appears well-developed and well-nourished.  Cardiovascular: Normal rate.  Abdominal: Soft. She exhibits no distension.  Neurological: She is alert and oriented to person, place, and time.  Skin: Skin is warm and dry.  Psychiatric: She has a normal mood and affect. Her behavior is normal. Judgment and thought content normal.     Assessment & Plan:  1. SAB (spontaneous abortion) Patient counseled regarding future pregnancy plans and timing. She would like to try for another pregnancy.  2. History of recurrent miscarriages - OB complication panel drawn - Follow up in approximately one week with Dr. Wanda Plump, CNM, MSN, [facility] 06/08/20 2:01 PM

## 2020-06-08 NOTE — Progress Notes (Signed)
Addendum: depo-provera not given.  Sharda Keddy,RN

## 2020-06-10 ENCOUNTER — Telehealth: Payer: 59

## 2020-06-16 LAB — OB COMPLICATIONS PROFILE
APTT: 23.4 s
AT III Act/Nor PPP Chro: 121 %
Anticardiolipin Ab, IgA: <10 [APL'U]
Anticardiolipin Ab, IgG: 11 [GPL'U]
Anticardiolipin Ab, IgM: <10 [MPL'U]
Antiphosphatidylserine IgG: 1 {GPS'U}
Antiphosphatidylserine IgM: 0 {MPS'U}
Beta-2 Glycoprotein I, IgA: <10 SAU
Beta-2 Glycoprotein I, IgG: <10 SGU
Beta-2 Glycoprotein I, IgM: <10 SMU
DRVVT Screen Seconds: 26.5 s
Factor XIII Activity**: 123 %
Hexagonal Phospholipid Neutral: 0 s
Homocysteine: 5.9 umol/L
PAI-1 Activity: <4.4 IU/mL
Protein S Antigen, Free: 52 % — ABNORMAL LOW
Prt C Activity (Chromogenic): 99 %

## 2020-06-23 ENCOUNTER — Other Ambulatory Visit: Payer: Self-pay

## 2020-06-23 ENCOUNTER — Other Ambulatory Visit (HOSPITAL_COMMUNITY)
Admission: RE | Admit: 2020-06-23 | Discharge: 2020-06-23 | Disposition: A | Discharge status: Home / Self Care | Payer: 59 | Source: Ambulatory Visit | Attending: Obstetrics & Gynecology | Admitting: Obstetrics & Gynecology

## 2020-06-23 ENCOUNTER — Ambulatory Visit (INDEPENDENT_AMBULATORY_CARE_PROVIDER_SITE_OTHER): Payer: 59 | Admitting: Obstetrics & Gynecology

## 2020-06-23 ENCOUNTER — Encounter: Payer: Self-pay | Admitting: Obstetrics & Gynecology

## 2020-06-23 VITALS — BP 128/83 | HR 94 | Wt 200.2 lb

## 2020-06-23 DIAGNOSIS — O3680X0 Pregnancy with inconclusive fetal viability, not applicable or unspecified: Secondary | ICD-10-CM | POA: Diagnosis not present

## 2020-06-23 DIAGNOSIS — O2620 Pregnancy care for patient with recurrent pregnancy loss, unspecified trimester: Secondary | ICD-10-CM | POA: Diagnosis not present

## 2020-06-23 DIAGNOSIS — Z3201 Encounter for pregnancy test, result positive: Secondary | ICD-10-CM

## 2020-06-23 DIAGNOSIS — Z789 Other specified health status: Secondary | ICD-10-CM

## 2020-06-23 DIAGNOSIS — B029 Zoster without complications: Secondary | ICD-10-CM | POA: Diagnosis not present

## 2020-06-23 DIAGNOSIS — N760 Acute vaginitis: Secondary | ICD-10-CM

## 2020-06-23 LAB — POCT PREGNANCY, URINE: Preg Test, Ur: POSITIVE — AB

## 2020-06-23 MED ORDER — PROGESTERONE 200 MG PO CAPS: 200.0000 mg | ORAL_CAPSULE | Freq: Two times a day (BID) | ORAL | 3 refills | Status: DC

## 2020-06-23 MED ORDER — VALACYCLOVIR HCL 1 G PO TABS: 1000.0000 mg | ORAL_TABLET | Freq: Three times a day (TID) | ORAL | 0 refills | Status: AC

## 2020-06-23 NOTE — Patient Instructions (Signed)
Return to clinic for any scheduled appointments or for any gynecologic concerns as needed.   

## 2020-06-23 NOTE — Progress Notes (Signed)
GYNECOLOGY OFFICE VISIT NOTE  History:   Crystal [name] is a 34 y.o. [id number] here today for discussion about recurrent miscarriages.  Patient reports she is pregnant again, had positive home UPT 06/19/20 and 06/22/20. Of note, had last SAB last month and HCG values were followed down to negative, HCG 5 on 05/25/2020.  She is interested in progesterone therapy.  Patient also desires medication for current shingles infection, usually occurs on her buttocks area and has activated her right inguinal lymph nodes which are painful.  Also reports vaginal irritation, not alleviated by Monistat.  She denies any abnormal vaginal bleeding, pelvic pain or other concerns.    Past Medical History:  Diagnosis Date  . Anemia   . Anxiety   . Depression   . H/O varicella   . History of gestational hypertension    2013--- PIH  . Hx of chlamydia infection   . PCOS (polycystic ovarian syndrome)    No radiological confirmation  . S/P gastric bypass 07/10/2017  . Wears glasses     Past Surgical History:  Procedure Laterality Date  . CESAREAN SECTION  08/18/2012   Procedure: CESAREAN SECTION;x 1  Surgeon: Tresa Endo A. Ernestina Penna, MD;  Location: [facility];  Service: Obstetrics;  Laterality: N/A;  Primary Cesarean Section   . CESAREAN SECTION N/A 05/21/2015   Procedure: CESAREAN SECTION;  Surgeon: Tereso Newcomer, MD;  Location: [facility];  Service: Obstetrics;  Laterality: N/A;  Requested 05-21-15 @ 1:15p  . LAPAROSCOPIC GASTRIC SLEEVE RESECTION N/A 07/10/2017   Procedure: LAPAROSCOPIC GASTRIC SLEEVE RESECTION WITH UPPER ENDO;  Surgeon: Berna Bue, MD;  Location: [facility];  Service: General;  Laterality: N/A;    The following portions of the patient's history were reviewed and updated as appropriate: allergies, current medications, past family history, past medical history, past social history, past surgical history and problem list.   Health Maintenance:  Normal pap and negative HRHPV on 11/21/2017.   Review of  Systems:  Pertinent items noted in HPI and remainder of comprehensive ROS otherwise negative.  Physical Exam:  BP 128/83   Pulse 94   Wt 200 lb 3.2 oz (90.8 kg)   Breastfeeding No   BMI 32.31 kg/m  CONSTITUTIONAL: Well-developed, well-nourished female in no acute distress.  HEENT:  Normocephalic, atraumatic. External right and left ear normal. No scleral icterus.  NECK: Normal range of motion, supple, no masses noted on observation SKIN: No rash noted. Not diaphoretic. No erythema. No pallor. MUSCULOSKELETAL: Normal range of motion. No edema noted. NEUROLOGIC: Alert and oriented to person, place, and time. Normal muscle tone coordination. No cranial nerve deficit noted. PSYCHIATRIC: Normal mood and affect. Normal behavior. Normal judgment and thought content. CARDIOVASCULAR: Normal heart rate noted RESPIRATORY: Effort and breath sounds normal, no problems with respiration noted ABDOMEN: No masses noted. No other overt distention noted.  Tender right inguinal node palpated.  PELVIC: Deferred  Labs and Imaging Results for orders placed or performed in visit on 06/23/20 (from the past 168 hour(s))  Pregnancy, urine POC   Collection Time: 06/23/20  1:55 PM  Result Value Ref Range   Preg Test, Ur POSITIVE (A) NEGATIVE   No results found.    Assessment and Plan:      1. Recurrent pregnancy loss with current pregnancy Discussed that therapies for RPL do not have much evidence backing them up, but she wants to try progesterone therapy.  Will follow up serum HCG and progesterone levels, decide on timing of viability scan. Progesterone  prescribed for now. - Beta hCG quant (ref lab) - Progesterone - progesterone (PROMETRIUM) 200 MG capsule; Take 1 capsule (200 mg total) by mouth 2 (two) times daily.  Dispense: 60 capsule; Refill: 3  2. Herpes zoster without complication Medication prescribed for shingles. - valACYclovir (VALTREX) 1000 MG tablet; Take 1 tablet (1,000 mg total) by mouth 3  (three) times daily for 7 days.  Dispense: 21 tablet; Refill: 0  3. Acute vaginitis - Cervicovaginal ancillary only self-swab done, will follow up results and manage accordingly.  Routine preventative health maintenance measures emphasized. Please refer to After Visit Summary for other counseling recommendations.   Return for any obstetric concerns.    Total face-to-face time with patient: 20 minutes.  Over 50% of encounter was spent on counseling and coordination of care.   Jaynie Collins, MD, FACOG Obstetrician & Gynecologist, [facility] for [employer], [facility]

## 2020-06-24 ENCOUNTER — Telehealth (INDEPENDENT_AMBULATORY_CARE_PROVIDER_SITE_OTHER): Payer: 59 | Admitting: Lactation Services

## 2020-06-24 DIAGNOSIS — Z712 Person consulting for explanation of examination or test findings: Secondary | ICD-10-CM

## 2020-06-24 LAB — CERVICOVAGINAL ANCILLARY ONLY
Bacterial Vaginitis (gardnerella): NEGATIVE
Candida Glabrata: NEGATIVE
Candida Vaginitis: NEGATIVE
Comment: NEGATIVE
Comment: NEGATIVE
Comment: NEGATIVE
Comment: NEGATIVE
Trichomonas: NEGATIVE

## 2020-06-24 LAB — PROGESTERONE: Progesterone: 22.8 ng/mL

## 2020-06-24 LAB — BETA HCG QUANT (REF LAB): hCG Quant: 7591 m[IU]/mL

## 2020-06-24 NOTE — Telephone Encounter (Signed)
-----   Message from Tereso Newcomer, MD sent at 06/24/2020  8:20 AM EDT ----- Elevated serum HCG level.  Please schedule for viability scan/dating scan next available given history of recurrent pregnancy loss; order placed. Also unknown location of current pregnancy,ectopic precautions given to patient via MyChart.

## 2020-06-24 NOTE — Telephone Encounter (Signed)
Called Radiology and scheduled Viability/Dating [place] on 10/26 @ 1 pm at MFM at [facility]   Called patient to inform her of Beta Hcg levels and given information on [place] date, time and location. Patient asked to arrive at the office at 12:45 with a full bladder.   Ectopic precautions given with instructions of when or where to seek emergency care. (MAU)   Patient voiced understanding and reports she has no questions or concerns at this time.

## 2020-06-24 NOTE — Addendum Note (Signed)
Addended by: Jaynie Collins A on: 06/24/2020 08:20 AM   Modules accepted: Orders

## 2020-07-06 ENCOUNTER — Other Ambulatory Visit: Payer: Self-pay | Admitting: Obstetrics & Gynecology

## 2020-07-06 ENCOUNTER — Other Ambulatory Visit: Payer: Self-pay

## 2020-07-06 ENCOUNTER — Ambulatory Visit
Admission: RE | Admit: 2020-07-06 | Discharge: 2020-07-06 | Disposition: A | Discharge status: Home / Self Care | Payer: 59 | Source: Ambulatory Visit | Attending: Obstetrics & Gynecology | Admitting: Obstetrics & Gynecology

## 2020-07-06 DIAGNOSIS — Z789 Other specified health status: Secondary | ICD-10-CM

## 2020-07-06 DIAGNOSIS — O3680X0 Pregnancy with inconclusive fetal viability, not applicable or unspecified: Secondary | ICD-10-CM

## 2020-07-06 DIAGNOSIS — O2620 Pregnancy care for patient with recurrent pregnancy loss, unspecified trimester: Secondary | ICD-10-CM

## 2020-07-07 ENCOUNTER — Telehealth: Payer: Self-pay | Admitting: Medical

## 2020-07-07 NOTE — Telephone Encounter (Signed)
LM for patient to return call to my office for results, also advised that results would be sent in MyChart. See separate MyChart message for information shared with patient.   Vonzella Nipple, PA-C 07/07/2020 11:13 AM

## 2020-09-12 DIAGNOSIS — O99840 Bariatric surgery status complicating pregnancy, unspecified trimester: Secondary | ICD-10-CM | POA: Insufficient documentation

## 2020-10-18 ENCOUNTER — Encounter (HOSPITAL_COMMUNITY): Payer: Self-pay | Admitting: Obstetrics and Gynecology

## 2020-10-18 ENCOUNTER — Inpatient Hospital Stay (HOSPITAL_COMMUNITY)
Admission: AD | Admit: 2020-10-18 | Discharge: 2020-10-18 | Disposition: A | Discharge status: Home / Self Care | Payer: 59 | Attending: Obstetrics and Gynecology | Admitting: Obstetrics and Gynecology

## 2020-10-18 ENCOUNTER — Other Ambulatory Visit: Payer: Self-pay

## 2020-10-18 DIAGNOSIS — Z3A21 21 weeks gestation of pregnancy: Secondary | ICD-10-CM | POA: Insufficient documentation

## 2020-10-18 DIAGNOSIS — O98512 Other viral diseases complicating pregnancy, second trimester: Secondary | ICD-10-CM | POA: Insufficient documentation

## 2020-10-18 DIAGNOSIS — Z3A00 Weeks of gestation of pregnancy not specified: Secondary | ICD-10-CM | POA: Diagnosis not present

## 2020-10-18 DIAGNOSIS — O98519 Other viral diseases complicating pregnancy, unspecified trimester: Secondary | ICD-10-CM

## 2020-10-18 DIAGNOSIS — O071 Delayed or excessive hemorrhage following failed attempted termination of pregnancy: Secondary | ICD-10-CM | POA: Diagnosis not present

## 2020-10-18 DIAGNOSIS — Z20822 Contact with and (suspected) exposure to covid-19: Secondary | ICD-10-CM

## 2020-10-18 DIAGNOSIS — U071 COVID-19: Secondary | ICD-10-CM | POA: Insufficient documentation

## 2020-10-18 DIAGNOSIS — R059 Cough, unspecified: Secondary | ICD-10-CM | POA: Diagnosis present

## 2020-10-18 LAB — SARS CORONAVIRUS 2 BY RT PCR (HOSPITAL ORDER, PERFORMED IN ~~LOC~~ HOSPITAL LAB): SARS Coronavirus 2: POSITIVE — AB

## 2020-10-18 MED ORDER — BENZONATATE 100 MG PO CAPS: 100.0000 mg | ORAL_CAPSULE | Freq: Three times a day (TID) | ORAL | 0 refills | Status: AC

## 2020-10-18 MED ORDER — FLUTICASONE PROPIONATE 50 MCG/ACT NA SUSP: 1.0000 | Freq: Every day | NASAL | 0 refills | Status: DC

## 2020-10-18 NOTE — MAU Provider Note (Signed)
Event Date/Time   First Provider Initiated Contact with Patient 10/18/20 [phone]      S [name] is a 35 y.o. [id number] patient who presents to MAU today with complaint of cough, congestion, headache and sore throat. She reports the symptoms started yesterday. She states the congestion made her feel short of breath while laying down. She denies any abdominal pain, vaginal bleeding or leaking of fluid. Reports normal fetal movement.    O BP 140/76   Pulse 98   Temp 99.7 F (37.6 C) (Oral)   Resp (!) 22   Wt 108 kg   LMP 03/30/2020   SpO2 100%   BMI 38.41 kg/m  Physical Exam Vitals and nursing note reviewed.  Constitutional:      General: She is not in acute distress.    Appearance: She is well-developed and well-nourished.  HENT:     Head: Normocephalic.  Eyes:     Pupils: Pupils are equal, round, and reactive to light.  Cardiovascular:     Rate and Rhythm: Normal rate and regular rhythm.     Heart sounds: Normal heart sounds.  Pulmonary:     Effort: Pulmonary effort is normal. No respiratory distress.     Breath sounds: Normal breath sounds.  Abdominal:     General: Bowel sounds are normal. There is no distension.     Palpations: Abdomen is soft.     Tenderness: There is no abdominal tenderness.  Skin:    General: Skin is warm and dry.  Neurological:     Mental Status: She is alert and oriented to person, place, and time.  Psychiatric:        Mood and Affect: Mood and affect normal.        Behavior: Behavior normal.        Thought Content: Thought content normal.        Judgment: Judgment normal.    FHT: 152 bpm  COVID-19 test  Results for orders placed or performed during the hospital encounter of 10/18/20 (from the past 24 hour(s))  SARS Coronavirus 2 by RT PCR (hospital order, performed in [facility] hospital lab) Nasopharyngeal Nasopharyngeal Swab     Status: Abnormal   Collection Time: 10/18/20  3:32 AM   Specimen: Nasopharyngeal Swab  Result Value  Ref Range   SARS Coronavirus 2 POSITIVE (A) NEGATIVE     A Medical screening exam complete COVID-19 in pregnancy  Reviewed results of positive COVID test with patient. Discussed typical course of virus and what to expect. Informed patient that symptoms tend to worsen on days 5-8 and reviewed safe medications for symptom management. Warning signs of when to return to MAU reviewed at length including shortness of breath, chest pain and decreased fetal movement. Instructed patient to quarantine for 10 days and any office appointments will be changed to virtual or rescheduled to outside of the quarantine window.   Recommended monoclonal antibody infusion for patient due to new diagnosis of COVID with symptom onset of less than 7 days and patient is currently unvaccinated for COVID. Discussed with patient that MAB are shown to decrease length and severity of the disease and can improve overall outcomes. Reviewed limited evidence for use in pregnancy as well as evidence regarding outcomes for COVID if untreated in pregnancy. Patient desires MAB. Order placed for ambulatory referral to COVID treatment. Patient informed that if they meet criteria for MAB infusion, the team will call to notify them. Instructed patient not to call MAB center  and if they receive no call, they will not receive the infusion due to limited supply. Patient verbalized understanding.    P Discharge from MAU in stable condition Patient instructed to call OB to rescheduled prenatal appointment to outside of COVID quarantine window List of options for follow-up given Warning signs for worsening condition that would warrant emergency follow-up discussed Patient may return to MAU as needed   Rolm Bookbinder, [place] 10/18/2020 3:39 AM

## 2020-10-18 NOTE — Discharge Instructions (Signed)
10 Things You Can Do to Manage Your COVID-19 Symptoms at Home If you have possible or confirmed COVID-19: 1. Stay home except to get medical care. 2. Monitor your symptoms carefully. If your symptoms get worse, call your healthcare provider immediately. 3. Get rest and stay hydrated. 4. If you have a medical appointment, call the healthcare provider ahead of time and tell them that you have or may have COVID-19. 5. For medical emergencies, call 911 and notify the dispatch personnel that you have or may have COVID-19. 6. Cover your cough and sneezes with a tissue or use the inside of your elbow. 7. Wash your hands often with soap and water for at least 20 seconds or clean your hands with an alcohol-based hand sanitizer that contains at least 60% alcohol. 8. As much as possible, stay in a specific room and away from other people in your home. Also, you should use a separate bathroom, if available. If you need to be around other people in or outside of the home, wear a mask. 9. Avoid sharing personal items with other people in your household, like dishes, towels, and bedding. 10. Clean all surfaces that are touched often, like counters, tabletops, and doorknobs. Use household cleaning sprays or wipes according to the label instructions. [employer] 03/26/2020 This information is not intended to replace advice given to you by your health care provider. Make sure you discuss any questions you have with your health care provider. Document Revised: 07/12/2020 Document Reviewed: 07/12/2020 Elsevier Patient Education  2021 Elsevier Inc.   Safe Medications in Pregnancy   Acne: Benzoyl Peroxide Salicylic Acid  Backache/Headache: Tylenol: 2 regular strength every 4 hours OR              2 Extra strength every 6 hours  Colds/Coughs/Allergies: Benadryl (alcohol free) 25 mg every 6 hours as needed Breath right strips Claritin Cepacol throat lozenges Chloraseptic throat spray Cold-Eeze-  up to three times per day Cough drops, alcohol free Flonase (by prescription only) Guaifenesin Mucinex Robitussin DM (plain only, alcohol free) Saline nasal spray/drops Sudafed (pseudoephedrine) & Actifed ** use only after [redacted] weeks gestation and if you do not have high blood pressure Tylenol Vicks Vaporub Zinc lozenges Zyrtec   Constipation: Colace Ducolax suppositories Fleet enema Glycerin suppositories Metamucil Milk of magnesia Miralax Senokot Smooth move tea  Diarrhea: Kaopectate Imodium A-D  *NO pepto Bismol  Hemorrhoids: Anusol Anusol HC Preparation H Tucks  Indigestion: Tums Maalox Mylanta Zantac  Pepcid  Insomnia: Benadryl (alcohol free) 25mg  every 6 hours as needed Tylenol PM Unisom, no Gelcaps  Leg Cramps: Tums MagGel  Nausea/Vomiting:  Bonine Dramamine Emetrol Ginger extract Sea bands Meclizine  Nausea medication to take during pregnancy:  Unisom (doxylamine succinate 25 mg tablets) Take one tablet daily at bedtime. If symptoms are not adequately controlled, the dose can be increased to a maximum recommended dose of two tablets daily (1/2 tablet in the morning, 1/2 tablet mid-afternoon and one at bedtime). Vitamin B6 100mg  tablets. Take one tablet twice a day (up to 200 mg per day).  Skin Rashes: Aveeno products Benadryl cream or 25mg  every 6 hours as needed Calamine Lotion 1% cortisone cream  Yeast infection: Gyne-lotrimin 7 Monistat 7   **If taking multiple medications, please check labels to avoid duplicating the same active ingredients **take medication as directed on the label ** Do not exceed 4000 mg of tylenol in 24 hours **Do not take medications that contain aspirin or ibuprofen     3 Key Steps  to Take While Waiting for Your COVID-19 Test Result To help stop the spread of COVID-19, take these 3 key steps NOW while waiting for your test results: 1. Stay home and monitor your health. Stay home and monitor your  health to help protect your friends, family, and others from possibly getting COVID-19 from you. Stay home and away from others:  If possible, stay away from others, especially people who are at higher risk for getting very sick from COVID-19, such as older adults and people with other medical conditions.  If you have been in contact with someone with COVID-19, stay home and away from others for 14 days after your last contact with that person. Follow the recommendations of your local public health department if you need to quarantine.  If you have a fever, cough or other symptoms of COVID-19, stay home and away from others (except to get medical care). Monitor your health:  Watch for fever, cough, shortness of breath, or other symptoms of COVID-19. Remember, symptoms may appear 2-14 days after exposure to COVID-19 and can include: ? Fever or chills ? Cough ? Shortness of breath or difficulty breathing ? Tiredness ? Muscle or body aches ? Headache ? New loss of taste or smell ? Sore throat ? Congestion or runny nose ? Nausea or vomiting ? Diarrhea 2. Think about the people you have recently been around. If you are diagnosed with COVID-19, a public health worker may call you to check on your health, discuss who you have been around, and ask where you spent time while you may have been able to spread COVID-19 to others. While you wait for your COVID-19 test result, think about everyone you have been around recently. This will be important information to give health workers if your test is positive.  Complete the information on the back of this page to help you remember everyone you have been around.  3. Answer the phone call from the health department. If a public health worker calls you, answer the call to help slow the spread of COVID-19 in your community.  Discussions with health department staff are confidential. This means that your personal and medical information will be kept private  and only shared with those who may need to know, like your health care provider.  Your name will not be shared with those you came in contact with. The health department will only notify people you were in close contact with (within 6 feet for more than 15 minutes) that they might have been exposed to COVID-19. Think about the people you have recently been around If you test positive and are diagnosed with COVID-19, someone from the health department may call to check-in on your health, discuss who you have been around, and ask where you spent time while you may have been able to spread COVID-19 to others. This form can help you think about people you have recently been around so you will be ready if a public health worker calls you. Things to think about. Have you:  Gone to work or school?  Gotten together with others (eaten out at [employer], gone out for drinks, exercised with others or gone to a gym, had friends or family over to your house, volunteered, gone to a party, pool, or park)?  Gone to a store in person (e.g., grocery store, mall)?  Gone to in-person appointments (e.g., salon, barber, doctor's or dentist's office)?  Ridden in a car with others (e.g., rideshare) or taken public  transportation?  Been inside a church, synagogue, mosque or other places of worship? Who lives with you?  ______________________________________________________________________  ______________________________________________________________________  ______________________________________________________________________  ______________________________________________________________________ Who have you been around (less than 6 feet for a total of 15 minutes or more) in the last 10 days? (You may have more people to list than the space provided. If so, write on the front of this sheet or a separate piece of paper.) Name ______________________________________________  Phone number  ____________________________________  Date you last saw them _____________________________  Where you last saw them ________________________________________________ Name ______________________________________________  Phone number ____________________________________  Date you last saw them _____________________________  Where you last saw them ________________________________________________ Name ______________________________________________  Phone number ____________________________________  Date you last saw them _____________________________  Where you last saw them ________________________________________________ Name ______________________________________________  Phone number ____________________________________  Date you last saw them _____________________________  Where you last saw them ________________________________________________ Name ______________________________________________  Phone number ____________________________________  Date you last saw them _____________________________  Where you last saw them ________________________________________________ What have you done in the last 10 days with other people? Activity _____________________________________________  Location _________________________________________  Date ____________________________________________ Activity _____________________________________________  Location _________________________________________  Date ____________________________________________ Activity _____________________________________________  Location _________________________________________  Date ____________________________________________ Activity _____________________________________________  Location _________________________________________  Date ____________________________________________ Activity _____________________________________________  Location _________________________________________  Date  ____________________________________________ Centers for Disease Control and Prevention [employer] 03/19/2020 This information is not intended to replace advice given to you by your health care provider. Make sure you discuss any questions you have with your health care provider. Document Revised: 07/12/2020 Document Reviewed: 07/12/2020 Elsevier Patient Education  2021 [employer].

## 2020-10-18 NOTE — Progress Notes (Signed)
Pt seen and evaluated in triage by Jerald Kief., cnm. Covid swab done and DC home and good condition

## 2020-10-18 NOTE — MAU Note (Signed)
SOB, HA, sore throat and bodyache since yesterday. Did not take any medications. No pregnancy issues at this time

## 2020-10-19 ENCOUNTER — Telehealth: Payer: Self-pay | Admitting: Family

## 2020-10-19 NOTE — Telephone Encounter (Signed)
Called to discuss with patient about COVID-19 symptoms and the use of one of the available treatments for those with mild to moderate Covid symptoms and at a high risk of hospitalization.  Pt appears to qualify for outpatient treatment due to co-morbid conditions and/or a member of an at-risk group in accordance with the FDA Emergency Use Authorization.     Unable to reach pt. Prior attempt by another member of the MAB team.   Morton Stall

## 2020-10-19 NOTE — Telephone Encounter (Signed)
Called to discuss with patient about COVID-19 symptoms and the use of one of the available treatments for those with mild to moderate Covid symptoms and at a high risk of hospitalization.  Pt appears to qualify for outpatient treatment due to co-morbid conditions and/or a member of an at-risk group in accordance with the FDA Emergency Use Authorization.    Symptom onset: 10/17/20 Positive test: 10/18/20 in Epic Vaccinated: No Booster? No Qualifiers: Pregnancy, unvaccinated, ethnicity, BMI  Unable to reach pt - Left VM requesting call back and sent MyChart message as well   Alver Sorrow

## 2020-10-27 ENCOUNTER — Other Ambulatory Visit: Payer: Self-pay | Admitting: Obstetrics & Gynecology

## 2020-10-27 DIAGNOSIS — O99012 Anemia complicating pregnancy, second trimester: Secondary | ICD-10-CM

## 2020-11-02 ENCOUNTER — Inpatient Hospital Stay (HOSPITAL_COMMUNITY): Admission: RE | Admit: 2020-11-02 | Payer: 59 | Source: Ambulatory Visit

## 2020-11-09 ENCOUNTER — Other Ambulatory Visit: Payer: Self-pay

## 2020-11-09 ENCOUNTER — Encounter (HOSPITAL_COMMUNITY)
Admission: RE | Admit: 2020-11-09 | Discharge: 2020-11-09 | Disposition: A | Discharge status: Home / Self Care | Payer: 59 | Source: Ambulatory Visit | Attending: Obstetrics & Gynecology | Admitting: Obstetrics & Gynecology

## 2020-11-09 DIAGNOSIS — O99012 Anemia complicating pregnancy, second trimester: Secondary | ICD-10-CM | POA: Insufficient documentation

## 2020-11-09 MED ORDER — SODIUM CHLORIDE 0.9 % IV SOLN
500.0000 mg | INTRAVENOUS | Status: DC
  Administered 2020-11-09: 500 mg via INTRAVENOUS
  Filled 2020-11-09: qty 25

## 2020-11-09 MED ORDER — DIPHENHYDRAMINE HCL 25 MG PO CAPS: 25.0000 mg | ORAL_CAPSULE | Freq: Four times a day (QID) | ORAL | Status: DC | PRN

## 2020-11-09 MED ORDER — ACETAMINOPHEN 325 MG PO TABS: 650.0000 mg | ORAL_TABLET | Freq: Four times a day (QID) | ORAL | Status: DC | PRN

## 2020-11-09 NOTE — Discharge Instructions (Signed)

## 2021-01-20 ENCOUNTER — Other Ambulatory Visit: Payer: Self-pay | Admitting: Obstetrics & Gynecology

## 2021-01-28 ENCOUNTER — Encounter (HOSPITAL_COMMUNITY): Payer: Self-pay | Admitting: *Deleted

## 2021-02-01 ENCOUNTER — Ambulatory Visit (HOSPITAL_COMMUNITY): Payer: 59

## 2021-02-02 ENCOUNTER — Inpatient Hospital Stay (HOSPITAL_COMMUNITY): Admission: RE | Admit: 2021-02-02 | Payer: 59 | Source: Ambulatory Visit

## 2021-02-10 ENCOUNTER — Encounter (HOSPITAL_COMMUNITY): Payer: Self-pay | Admitting: Obstetrics and Gynecology

## 2021-02-10 ENCOUNTER — Inpatient Hospital Stay (HOSPITAL_COMMUNITY)
Admission: AD | Admit: 2021-02-10 | Discharge: 2021-02-13 | DRG: 787 | Disposition: A | Discharge status: Home / Self Care | Payer: 59 | Attending: Obstetrics & Gynecology | Admitting: Obstetrics & Gynecology

## 2021-02-10 ENCOUNTER — Inpatient Hospital Stay (HOSPITAL_COMMUNITY): Payer: 59 | Admitting: Anesthesiology

## 2021-02-10 ENCOUNTER — Encounter (HOSPITAL_COMMUNITY)
Admission: AD | Disposition: A | Discharge status: Home / Self Care | Payer: Self-pay | Source: Home / Self Care | Attending: Obstetrics & Gynecology

## 2021-02-10 DIAGNOSIS — N736 Female pelvic peritoneal adhesions (postinfective): Secondary | ICD-10-CM | POA: Diagnosis present

## 2021-02-10 DIAGNOSIS — O99844 Bariatric surgery status complicating childbirth: Secondary | ICD-10-CM | POA: Diagnosis present

## 2021-02-10 DIAGNOSIS — O99892 Other specified diseases and conditions complicating childbirth: Secondary | ICD-10-CM | POA: Diagnosis present

## 2021-02-10 DIAGNOSIS — Z87891 Personal history of nicotine dependence: Secondary | ICD-10-CM | POA: Diagnosis not present

## 2021-02-10 DIAGNOSIS — O99824 Streptococcus B carrier state complicating childbirth: Secondary | ICD-10-CM | POA: Diagnosis present

## 2021-02-10 DIAGNOSIS — O34211 Maternal care for low transverse scar from previous cesarean delivery: Principal | ICD-10-CM | POA: Diagnosis present

## 2021-02-10 DIAGNOSIS — Z8616 Personal history of COVID-19: Secondary | ICD-10-CM

## 2021-02-10 DIAGNOSIS — O9902 Anemia complicating childbirth: Secondary | ICD-10-CM | POA: Diagnosis present

## 2021-02-10 DIAGNOSIS — O9081 Anemia of the puerperium: Secondary | ICD-10-CM | POA: Diagnosis not present

## 2021-02-10 DIAGNOSIS — O26893 Other specified pregnancy related conditions, third trimester: Secondary | ICD-10-CM | POA: Diagnosis present

## 2021-02-10 DIAGNOSIS — O99214 Obesity complicating childbirth: Secondary | ICD-10-CM | POA: Diagnosis present

## 2021-02-10 DIAGNOSIS — D62 Acute posthemorrhagic anemia: Secondary | ICD-10-CM | POA: Diagnosis not present

## 2021-02-10 DIAGNOSIS — Z20822 Contact with and (suspected) exposure to covid-19: Secondary | ICD-10-CM | POA: Diagnosis present

## 2021-02-10 DIAGNOSIS — Z98891 History of uterine scar from previous surgery: Secondary | ICD-10-CM

## 2021-02-10 DIAGNOSIS — Z3A38 38 weeks gestation of pregnancy: Secondary | ICD-10-CM

## 2021-02-10 LAB — CBC
HCT: 33.2 % — ABNORMAL LOW (ref 36.0–46.0)
Hemoglobin: 9.3 g/dL — ABNORMAL LOW (ref 12.0–15.0)
MCH: 20.1 pg — ABNORMAL LOW (ref 26.0–34.0)
MCHC: 28 g/dL — ABNORMAL LOW (ref 30.0–36.0)
MCV: 71.7 fL — ABNORMAL LOW (ref 80.0–100.0)
Platelets: 312 10*3/uL (ref 150–400)
RBC: 4.63 MIL/uL (ref 3.87–5.11)
RDW: 20.2 % — ABNORMAL HIGH (ref 11.5–15.5)
WBC: 10.1 10*3/uL (ref 4.0–10.5)
nRBC: 0 % (ref 0.0–0.2)

## 2021-02-10 LAB — RESP PANEL BY RT-PCR (FLU A&B, COVID) ARPGX2
Influenza A by PCR: NEGATIVE
Influenza B by PCR: NEGATIVE
SARS Coronavirus 2 by RT PCR: NEGATIVE

## 2021-02-10 LAB — TYPE AND SCREEN
ABO/RH(D): B POS
Antibody Screen: NEGATIVE

## 2021-02-10 SURGERY — Surgical Case
Anesthesia: Spinal

## 2021-02-10 MED ORDER — NALBUPHINE HCL 10 MG/ML IJ SOLN: 5.0000 mg | Freq: Once | INTRAMUSCULAR | Status: DC | PRN

## 2021-02-10 MED ORDER — KETOROLAC TROMETHAMINE 30 MG/ML IJ SOLN: 30.0000 mg | Freq: Once | INTRAMUSCULAR | Status: DC

## 2021-02-10 MED ORDER — FENTANYL CITRATE (PF) 100 MCG/2ML IJ SOLN: 25.0000 ug | INTRAMUSCULAR | Status: DC | PRN

## 2021-02-10 MED ORDER — KETOROLAC TROMETHAMINE 30 MG/ML IJ SOLN: 30.0000 mg | Freq: Four times a day (QID) | INTRAMUSCULAR | Status: DC | PRN

## 2021-02-10 MED ORDER — DIPHENHYDRAMINE HCL 25 MG PO CAPS: 25.0000 mg | ORAL_CAPSULE | Freq: Four times a day (QID) | ORAL | Status: DC | PRN

## 2021-02-10 MED ORDER — DIPHENHYDRAMINE HCL 50 MG/ML IJ SOLN
12.5000 mg | Freq: Four times a day (QID) | INTRAMUSCULAR | Status: DC | PRN
  Administered 2021-02-11: 12.5 mg via INTRAVENOUS
  Filled 2021-02-10: qty 1

## 2021-02-10 MED ORDER — ONDANSETRON HCL 4 MG/2ML IJ SOLN
4.0000 mg | Freq: Three times a day (TID) | INTRAMUSCULAR | Status: DC | PRN
  Administered 2021-02-11: 4 mg via INTRAVENOUS

## 2021-02-10 MED ORDER — PROMETHAZINE HCL 25 MG/ML IJ SOLN: 6.2500 mg | INTRAMUSCULAR | Status: DC | PRN

## 2021-02-10 MED ORDER — MORPHINE SULFATE (PF) 0.5 MG/ML IJ SOLN
INTRAMUSCULAR | Status: DC | PRN
  Administered 2021-02-10: 150 ug via INTRATHECAL

## 2021-02-10 MED ORDER — MORPHINE SULFATE (PF) 0.5 MG/ML IJ SOLN
INTRAMUSCULAR | Status: AC
  Filled 2021-02-10: qty 10

## 2021-02-10 MED ORDER — ACETAMINOPHEN 160 MG/5ML PO SOLN: 1000.0000 mg | Freq: Once | ORAL | Status: DC

## 2021-02-10 MED ORDER — FAMOTIDINE IN NACL 20-0.9 MG/50ML-% IV SOLN
20.0000 mg | Freq: Once | INTRAVENOUS | Status: AC
Start: 2021-02-10 — End: 2021-02-10
  Administered 2021-02-10: 20 mg via INTRAVENOUS
  Filled 2021-02-10: qty 50

## 2021-02-10 MED ORDER — SODIUM CHLORIDE 0.9% FLUSH: 3.0000 mL | INTRAVENOUS | Status: DC | PRN

## 2021-02-10 MED ORDER — BUPIVACAINE IN DEXTROSE 0.75-8.25 % IT SOLN
INTRATHECAL | Status: DC | PRN
Start: 2021-02-10 — End: 2021-02-11
  Administered 2021-02-10: 1.7 mL via INTRATHECAL

## 2021-02-10 MED ORDER — ACETAMINOPHEN 500 MG PO TABS
1000.0000 mg | ORAL_TABLET | Freq: Four times a day (QID) | ORAL | Status: AC
  Administered 2021-02-11: 1000 mg via ORAL
  Filled 2021-02-10 (×2): qty 2

## 2021-02-10 MED ORDER — LACTATED RINGERS IV SOLN: INTRAVENOUS | Status: DC

## 2021-02-10 MED ORDER — NALOXONE HCL 0.4 MG/ML IJ SOLN: 0.4000 mg | INTRAMUSCULAR | Status: DC | PRN

## 2021-02-10 MED ORDER — POVIDONE-IODINE 10 % EX SWAB: 2.0000 "application " | Freq: Once | CUTANEOUS | Status: DC

## 2021-02-10 MED ORDER — CEFAZOLIN SODIUM-DEXTROSE 2-4 GM/100ML-% IV SOLN
2.0000 g | INTRAVENOUS | Status: AC
  Administered 2021-02-10: 3 g via INTRAVENOUS

## 2021-02-10 MED ORDER — ACETAMINOPHEN 500 MG PO TABS: 1000.0000 mg | ORAL_TABLET | Freq: Once | ORAL | Status: DC

## 2021-02-10 MED ORDER — NALBUPHINE HCL 10 MG/ML IJ SOLN: 5.0000 mg | INTRAMUSCULAR | Status: DC | PRN

## 2021-02-10 MED ORDER — FENTANYL CITRATE (PF) 100 MCG/2ML IJ SOLN
INTRAMUSCULAR | Status: AC
  Filled 2021-02-10: qty 2

## 2021-02-10 MED ORDER — SOD CITRATE-CITRIC ACID 500-334 MG/5ML PO SOLN
30.0000 mL | Freq: Once | ORAL | Status: AC
  Administered 2021-02-10: 30 mL via ORAL
  Filled 2021-02-10: qty 15

## 2021-02-10 MED ORDER — NALOXONE HCL 4 MG/10ML IJ SOLN
1.0000 ug/kg/h | INTRAVENOUS | Status: DC | PRN
  Filled 2021-02-10: qty 5

## 2021-02-10 MED ORDER — FENTANYL CITRATE (PF) 100 MCG/2ML IJ SOLN
INTRAMUSCULAR | Status: DC | PRN
  Administered 2021-02-10: 15 ug via INTRATHECAL

## 2021-02-10 SURGICAL SUPPLY — 44 items
APL SKNCLS STERI-STRIP NONHPOA (GAUZE/BANDAGES/DRESSINGS) ×1
BENZOIN TINCTURE PRP APPL 2/3 (GAUZE/BANDAGES/DRESSINGS) ×1 IMPLANT
BLADE SURG 10 STRL SS (BLADE) ×1 IMPLANT
CHLORAPREP W/TINT 26ML (MISCELLANEOUS) ×2 IMPLANT
CLAMP CORD UMBIL (MISCELLANEOUS) IMPLANT
CLOTH BEACON ORANGE TIMEOUT ST (SAFETY) ×2 IMPLANT
DRSG OPSITE POSTOP 4X10 (GAUZE/BANDAGES/DRESSINGS) ×2 IMPLANT
ELECT REM PT RETURN 9FT ADLT (ELECTROSURGICAL) ×4
ELECTRODE REM PT RTRN 9FT ADLT (ELECTROSURGICAL) ×1 IMPLANT
EXTRACTOR VACUUM KIWI (MISCELLANEOUS) IMPLANT
EXTRACTOR VACUUM M CUP 4 TUBE (SUCTIONS) IMPLANT
GAUZE SPONGE 4X4 12PLY STRL (GAUZE/BANDAGES/DRESSINGS) ×2 IMPLANT
GLOVE BIO SURGEON STRL SZ7 (GLOVE) ×2 IMPLANT
GLOVE BIOGEL PI IND STRL 7.0 (GLOVE) ×2 IMPLANT
GLOVE BIOGEL PI INDICATOR 7.0 (GLOVE) ×2
GOWN STRL REUS W/TWL LRG LVL3 (GOWN DISPOSABLE) ×4 IMPLANT
KIT ABG SYR 3ML LUER SLIP (SYRINGE) IMPLANT
NDL HYPO 25X5/8 SAFETYGLIDE (NEEDLE) IMPLANT
NEEDLE HYPO 25X5/8 SAFETYGLIDE (NEEDLE) IMPLANT
NS IRRIG 1000ML POUR BTL (IV SOLUTION) ×2 IMPLANT
PACK C SECTION WH (CUSTOM PROCEDURE TRAY) ×2 IMPLANT
PAD ABD 7.5X8 STRL (GAUZE/BANDAGES/DRESSINGS) ×1 IMPLANT
PAD OB MATERNITY 4.3X12.25 (PERSONAL CARE ITEMS) ×2 IMPLANT
PENCIL BUTTON HOLSTER BLD 10FT (ELECTRODE) ×1 IMPLANT
RTRCTR C-SECT PINK 25CM LRG (MISCELLANEOUS) IMPLANT
SPONGE LAP 18X18 X RAY DECT (DISPOSABLE) ×2 IMPLANT
STRIP CLOSURE SKIN 1/2X4 (GAUZE/BANDAGES/DRESSINGS) ×1 IMPLANT
SUT MNCRL 0 VIOLET CTX 36 (SUTURE) ×2 IMPLANT
SUT MON AB 4-0 PS1 27 (SUTURE) ×1 IMPLANT
SUT MON AB-0 CT1 36 (SUTURE) ×1 IMPLANT
SUT MONOCRYL 0 CTX 36 (SUTURE) ×6
SUT PLAIN 0 NONE (SUTURE) IMPLANT
SUT PLAIN 2 0 (SUTURE)
SUT PLAIN ABS 2-0 CT1 27XMFL (SUTURE) IMPLANT
SUT VIC AB 0 CT1 27 (SUTURE) ×10
SUT VIC AB 0 CT1 27XBRD ANBCTR (SUTURE) ×2 IMPLANT
SUT VIC AB 0 CT1 36 (SUTURE) ×1 IMPLANT
SUT VIC AB 2-0 CT1 27 (SUTURE) ×2
SUT VIC AB 2-0 CT1 TAPERPNT 27 (SUTURE) ×1 IMPLANT
SUT VIC AB 4-0 KS 27 (SUTURE) ×3 IMPLANT
SUT VICRYL 0 TIES 12 18 (SUTURE) IMPLANT
TOWEL OR 17X24 6PK STRL BLUE (TOWEL DISPOSABLE) ×2 IMPLANT
TRAY FOLEY W/BAG SLVR 14FR LF (SET/KITS/TRAYS/PACK) IMPLANT
WATER STERILE IRR 1000ML POUR (IV SOLUTION) ×2 IMPLANT

## 2021-02-10 NOTE — H&P (Signed)
Crystal [name] is a 35 y.o. [id number] at 105w1d gestation presents for complaint of Contractions  - all day and then painful tonight.  Denies lof, vb, +FM.  Antepartum course: covid +; anemia, s/p iv iron; h/o gastric sleeve ; excessive wt gain in pregnancy [facility] at [facility] since 9 wks.  See complete pre-natal records  History OB History    Gravida  7   Para  2   Term  2   Preterm  0   AB  4   Living  2     SAB  4   IAB      Ectopic      Multiple  0   Live Births  2          Past Medical History:  Diagnosis Date  . Anemia   . Anxiety   . Depression   . H/O varicella   . History of gestational hypertension    2013--- PIH  . Hx of chlamydia infection   . PCOS (polycystic ovarian syndrome)    No radiological confirmation  . S/P gastric bypass 07/10/2017  . Wears glasses    Past Surgical History:  Procedure Laterality Date  . CESAREAN SECTION  08/18/2012   Procedure: CESAREAN SECTION;x 1  Surgeon: Tresa Endo A. Ernestina Penna, MD;  Location: [facility];  Service: Obstetrics;  Laterality: N/A;  Primary Cesarean Section   . CESAREAN SECTION N/A 05/21/2015   Procedure: CESAREAN SECTION;  Surgeon: Tereso Newcomer, MD;  Location: [facility];  Service: Obstetrics;  Laterality: N/A;  Requested 05-21-15 @ 1:15p  . LAPAROSCOPIC GASTRIC SLEEVE RESECTION N/A 07/10/2017   Procedure: LAPAROSCOPIC GASTRIC SLEEVE RESECTION WITH UPPER ENDO;  Surgeon: Berna Bue, MD;  Location: [facility];  Service: General;  Laterality: N/A;   Family History: family history includes Asthma in her father and mother; Cancer in her maternal grandmother; Diabetes in her father, maternal grandmother, and mother; Heart disease in her father and mother; Hypertension in her father, maternal grandmother, and mother; Stroke in an other family member. Social History:  reports that she quit smoking about 7 years ago. Her smoking use included cigarettes. She has never used smokeless tobacco. She reports previous alcohol  use. She reports that she does not use drugs.  ROS: See above otherwise negative  Prenatal labs:  ABO, Rh: --/--/PENDING (06/02 2128)rh pos Antibody: PENDING (06/02 2128)ab neg Rubella:  immune RPR:   nr  HBsAg:   nr HIV:  nr GBS:    1 hr Glucola: Normal Genetic screening: Normal Anatomy US: Normal  Physical Exam:   Dilation: 4.5 Effacement (%): 50 Station: -3 Exam by:: JESSICA, CNM Blood pressure 127/62, pulse 87, temperature 98.6 F (37 C), resp. rate 18, height 5\' 6"  (1.676 m), weight 124.7 kg, last menstrual period 03/30/2020. A&O x 3 HEENT: Normal Lungs: CTAB CV: RRR Abdominal: Soft, Non-tender and Gravid Lower Extremities: Non-edematous, Non-tender  Pelvic Exam:      Per nursing  Fht: cat 1 Toco: q 2-3 min  Labs:  CBC:  Lab Results  Component Value Date   WBC 10.1 02/10/2021   RBC 4.63 02/10/2021   HGB 9.3 (L) 02/10/2021   HCT 33.2 (L) 02/10/2021   MCV 71.7 (L) 02/10/2021   MCH 20.1 (L) 02/10/2021   MCHC 28.0 (L) 02/10/2021   RDW 20.2 (H) 02/10/2021   PLT 312 02/10/2021   CMP:  Lab Results  Component Value Date   NA 138 05/16/2020   K 4.0 05/16/2020  CL 108 05/16/2020   CO2 24 05/16/2020   GLUCOSE 93 05/16/2020   BUN 8 05/16/2020   CREATININE 0.87 05/16/2020   CALCIUM 8.9 05/16/2020   PROT 6.6 05/16/2020   AST 21 05/16/2020   ALT 16 05/16/2020   ALBUMIN 3.4 (L) 05/16/2020   ALKPHOS 35 (L) 05/16/2020   BILITOT 0.6 05/16/2020   GFRNONAA >60 05/16/2020   GFRAA >60 05/16/2020   ANIONGAP 6 05/16/2020   Urine: Lab Results  Component Value Date   COLORURINE YELLOW 05/16/2020   APPEARANCEUR CLEAR 05/16/2020   LABSPEC 1.020 05/16/2020   PHURINE 7.0 05/16/2020   GLUCOSEU NEGATIVE 05/16/2020   HGBUR MODERATE (A) 05/16/2020   BILIRUBINUR NEGATIVE 05/16/2020   KETONESUR NEGATIVE 05/16/2020   PROTEINUR NEGATIVE 05/16/2020   NITRITE NEGATIVE 05/16/2020   LEUKOCYTESUR NEGATIVE 05/16/2020     Prenatal Transfer Tool  Maternal Diabetes:  No Genetic Screening: Normal Maternal Ultrasounds/Referrals: Normal Fetal Ultrasounds or other Referrals:  None Maternal Substance Abuse:  No Significant Maternal Medications:  None Significant Maternal Lab Results: Group B Strep positive    Assessment/Plan:  35 y.o. [id number] at [redacted]w[redacted]d gestation   1. Active labor - admit for delivery; h/o c/s x2 and plan for rltcs; pt has declined btl in office and declines today; risk of surgery reviewed: risk of bleeding (possible need for transfusion), infection, risk of injury to bowel, bladder, nerves, blood vessels, risk of further surgery, risk of anesthesia, risk of blood clot to leg/lung; consent signed; proceed to OR 2. Anemia - s/p iv iron, asymptomatic 3. Excessive wt gain in pregnancy, AGA growth 4. Rh pos 5. RI 6. GBS positive  Vick Frees 02/10/2021, 10:18 PM

## 2021-02-10 NOTE — Anesthesia Preprocedure Evaluation (Addendum)
Anesthesia Evaluation  Patient identified by MRN, date of birth, ID band Patient awake    Reviewed: Allergy & Precautions, NPO status , Patient's Chart, lab work & pertinent test results  History of Anesthesia Complications Negative for: history of anesthetic complications  Airway Mallampati: II  TM Distance: >3 FB Neck ROM: Full    Dental no notable dental hx.    Pulmonary neg pulmonary ROS, former smoker,    Pulmonary exam normal        Cardiovascular negative cardio ROS Normal cardiovascular exam     Neuro/Psych Anxiety Depression negative neurological ROS     GI/Hepatic negative GI ROS, Neg liver ROS,   Endo/Other  Morbid obesity  Renal/GU negative Renal ROS  negative genitourinary   Musculoskeletal negative musculoskeletal ROS (+)   Abdominal   Peds  Hematology  (+) anemia , Hgb 9.3   Anesthesia Other Findings Day of surgery medications reviewed with patient.  Reproductive/Obstetrics (+) Pregnancy (Hx of C/S x2)                            Anesthesia Physical Anesthesia Plan  ASA: III  Anesthesia Plan: Spinal   Post-op Pain Management:    Induction:   PONV Risk Score and Plan: 4 or greater and Treatment may vary due to age or medical condition, Ondansetron and Dexamethasone  Airway Management Planned: Natural Airway  Additional Equipment: None  Intra-op Plan:   Post-operative Plan:   Informed Consent: I have reviewed the patients History and Physical, chart, labs and discussed the procedure including the risks, benefits and alternatives for the proposed anesthesia with the patient or authorized representative who has indicated his/her understanding and acceptance.       Plan Discussed with: CRNA  Anesthesia Plan Comments:        Anesthesia Quick Evaluation

## 2021-02-10 NOTE — MAU Note (Signed)
Pt reports she has had ctx since this morning. Have gotten stronger and closer About 2-3 min apart. Reports she lost her mucus plug but denies any leaking.  Good fetal movement felt.

## 2021-02-10 NOTE — MAU Provider Note (Signed)
None      S: Ms. MARYCLARE NYDAM is a 35 y.o. [id number] at [redacted]w[redacted]d  who presents to MAU today complaining contractions  since this morning. She denies vaginal bleeding. She denies LOF. She reports normal fetal movement.    O: BP 127/62   Pulse 87   Temp 98.6 F (37 C)   Resp 18   Ht 5\' 6"  (1.676 m)   Wt 124.7 kg   LMP 03/30/2020   BMI 44.39 kg/m  GENERAL: Well-developed, well-nourished female in no acute distress.  HEAD: Normocephalic, atraumatic.  CHEST: Normal effort of breathing, regular heart rate ABDOMEN: Soft, nontender, gravid  Cervical exam:  Dilation: 4.5 Effacement (%): 50 Cervical Position: Posterior Station: -3 Presentation: Vertex Exam by:: Laney Louderback, CNM   Fetal Monitoring: Baseline: 135 Variability: Moderate Accelerations: Present 15x15 Decelerations: None Contractions: Q1-42min   A: SIUP at [redacted]w[redacted]d  Active labor  H/O C/S x 2  P:  Nurse instructed to notify primary ob and obtain admission orders.   [redacted]w[redacted]d, CNM 02/10/2021 9:49 PM

## 2021-02-10 NOTE — MAU Note (Signed)
WAS IN OFFICE - NO VE .   IS SCH FOR  REPEAT C/S ON 6-10

## 2021-02-11 ENCOUNTER — Encounter (HOSPITAL_COMMUNITY): Payer: Self-pay | Admitting: Obstetrics & Gynecology

## 2021-02-11 LAB — CBC
HCT: 28.1 % — ABNORMAL LOW (ref 36.0–46.0)
Hemoglobin: 8 g/dL — ABNORMAL LOW (ref 12.0–15.0)
MCH: 20.3 pg — ABNORMAL LOW (ref 26.0–34.0)
MCHC: 28.5 g/dL — ABNORMAL LOW (ref 30.0–36.0)
MCV: 71.3 fL — ABNORMAL LOW (ref 80.0–100.0)
Platelets: 275 10*3/uL (ref 150–400)
RBC: 3.94 MIL/uL (ref 3.87–5.11)
RDW: 20.1 % — ABNORMAL HIGH (ref 11.5–15.5)
WBC: 17.6 10*3/uL — ABNORMAL HIGH (ref 4.0–10.5)
nRBC: 0 % (ref 0.0–0.2)

## 2021-02-11 LAB — RPR: RPR Ser Ql: NONREACTIVE

## 2021-02-11 MED ORDER — KETOROLAC TROMETHAMINE 30 MG/ML IJ SOLN
30.0000 mg | Freq: Once | INTRAMUSCULAR | Status: AC
  Administered 2021-02-11: 30 mg via INTRAVENOUS

## 2021-02-11 MED ORDER — FENTANYL CITRATE (PF) 100 MCG/2ML IJ SOLN
25.0000 ug | INTRAMUSCULAR | Status: DC | PRN
  Administered 2021-02-11 (×2): 50 ug via INTRAVENOUS

## 2021-02-11 MED ORDER — OXYTOCIN-SODIUM CHLORIDE 30-0.9 UT/500ML-% IV SOLN: 2.5000 [IU]/h | INTRAVENOUS | Status: AC

## 2021-02-11 MED ORDER — WITCH HAZEL-GLYCERIN EX PADS
1.0000 | MEDICATED_PAD | CUTANEOUS | Status: DC | PRN
Start: 2021-02-11 — End: 2021-02-13

## 2021-02-11 MED ORDER — TETANUS-DIPHTH-ACELL PERTUSSIS 5-2.5-18.5 LF-MCG/0.5 IM SUSY: 0.5000 mL | PREFILLED_SYRINGE | Freq: Once | INTRAMUSCULAR | Status: DC

## 2021-02-11 MED ORDER — DEXAMETHASONE SODIUM PHOSPHATE 10 MG/ML IJ SOLN
INTRAMUSCULAR | Status: DC | PRN
  Administered 2021-02-11: 10 mg via INTRAVENOUS

## 2021-02-11 MED ORDER — DIBUCAINE (PERIANAL) 1 % EX OINT
1.0000 | TOPICAL_OINTMENT | CUTANEOUS | Status: DC | PRN
Start: 2021-02-11 — End: 2021-02-13

## 2021-02-11 MED ORDER — MENTHOL 3 MG MT LOZG: 1.0000 | LOZENGE | OROMUCOSAL | Status: DC | PRN

## 2021-02-11 MED ORDER — KETOROLAC TROMETHAMINE 30 MG/ML IJ SOLN
30.0000 mg | Freq: Four times a day (QID) | INTRAMUSCULAR | Status: AC
  Administered 2021-02-11 – 2021-02-12 (×3): 30 mg via INTRAVENOUS
  Filled 2021-02-11 (×3): qty 1

## 2021-02-11 MED ORDER — SODIUM CHLORIDE 0.9 % IV SOLN
500.0000 mg | INTRAVENOUS | Status: DC
  Administered 2021-02-11: 500 mg via INTRAVENOUS
  Filled 2021-02-11: qty 25

## 2021-02-11 MED ORDER — SIMETHICONE 80 MG PO CHEW
80.0000 mg | CHEWABLE_TABLET | ORAL | Status: DC | PRN
  Administered 2021-02-12: 80 mg via ORAL

## 2021-02-11 MED ORDER — KETOROLAC TROMETHAMINE 30 MG/ML IJ SOLN
INTRAMUSCULAR | Status: AC
  Filled 2021-02-11: qty 1

## 2021-02-11 MED ORDER — STERILE WATER FOR IRRIGATION IR SOLN
Status: DC | PRN
  Administered 2021-02-11: 1000 mL

## 2021-02-11 MED ORDER — OXYTOCIN-SODIUM CHLORIDE 30-0.9 UT/500ML-% IV SOLN
INTRAVENOUS | Status: DC | PRN
  Administered 2021-02-11: 30 [IU] via INTRAVENOUS

## 2021-02-11 MED ORDER — ACETAMINOPHEN 500 MG PO TABS
1000.0000 mg | ORAL_TABLET | Freq: Four times a day (QID) | ORAL | Status: DC
  Administered 2021-02-11 – 2021-02-13 (×8): 1000 mg via ORAL
  Filled 2021-02-11 (×7): qty 2

## 2021-02-11 MED ORDER — ONDANSETRON HCL 4 MG/2ML IJ SOLN
INTRAMUSCULAR | Status: AC
  Filled 2021-02-11: qty 2

## 2021-02-11 MED ORDER — PHENYLEPHRINE HCL-NACL 20-0.9 MG/250ML-% IV SOLN
INTRAVENOUS | Status: DC | PRN
  Administered 2021-02-10: 60 ug/min via INTRAVENOUS

## 2021-02-11 MED ORDER — OXYCODONE HCL 5 MG PO TABS
5.0000 mg | ORAL_TABLET | ORAL | Status: DC | PRN
  Administered 2021-02-11 – 2021-02-12 (×4): 5 mg via ORAL
  Filled 2021-02-11 (×4): qty 1

## 2021-02-11 MED ORDER — FLUTICASONE PROPIONATE 50 MCG/ACT NA SUSP
1.0000 | Freq: Every day | NASAL | Status: DC
  Administered 2021-02-12 – 2021-02-13 (×2): 1 via NASAL
  Filled 2021-02-11: qty 16

## 2021-02-11 MED ORDER — FENTANYL CITRATE (PF) 100 MCG/2ML IJ SOLN
INTRAMUSCULAR | Status: AC
  Filled 2021-02-11: qty 2

## 2021-02-11 MED ORDER — COCONUT OIL OIL: 1.0000 "application " | TOPICAL_OIL | Status: DC | PRN

## 2021-02-11 MED ORDER — DIPHENHYDRAMINE HCL 25 MG PO CAPS: 25.0000 mg | ORAL_CAPSULE | Freq: Four times a day (QID) | ORAL | Status: DC | PRN

## 2021-02-11 MED ORDER — MAGNESIUM OXIDE -MG SUPPLEMENT 400 (240 MG) MG PO TABS
400.0000 mg | ORAL_TABLET | Freq: Every day | ORAL | Status: DC
  Administered 2021-02-11 – 2021-02-13 (×3): 400 mg via ORAL
  Filled 2021-02-11 (×3): qty 1

## 2021-02-11 MED ORDER — SENNOSIDES-DOCUSATE SODIUM 8.6-50 MG PO TABS
2.0000 | ORAL_TABLET | Freq: Every day | ORAL | Status: DC
  Administered 2021-02-12 – 2021-02-13 (×2): 2 via ORAL
  Filled 2021-02-11 (×2): qty 2

## 2021-02-11 MED ORDER — IBUPROFEN 600 MG PO TABS
600.0000 mg | ORAL_TABLET | Freq: Four times a day (QID) | ORAL | Status: DC
  Administered 2021-02-12 – 2021-02-13 (×5): 600 mg via ORAL
  Filled 2021-02-11 (×5): qty 1

## 2021-02-11 MED ORDER — SIMETHICONE 80 MG PO CHEW
80.0000 mg | CHEWABLE_TABLET | Freq: Three times a day (TID) | ORAL | Status: DC
  Administered 2021-02-11 – 2021-02-13 (×5): 80 mg via ORAL
  Filled 2021-02-11 (×7): qty 1

## 2021-02-11 MED ORDER — PRENATAL MULTIVITAMIN CH
1.0000 | ORAL_TABLET | Freq: Every day | ORAL | Status: DC
  Administered 2021-02-11 – 2021-02-12 (×2): 1 via ORAL
  Filled 2021-02-11 (×2): qty 1

## 2021-02-11 MED ORDER — POLYSACCHARIDE IRON COMPLEX 150 MG PO CAPS
150.0000 mg | ORAL_CAPSULE | Freq: Every day | ORAL | Status: DC
  Administered 2021-02-12 – 2021-02-13 (×2): 150 mg via ORAL
  Filled 2021-02-11 (×2): qty 1

## 2021-02-11 MED ORDER — CEFAZOLIN IN SODIUM CHLORIDE 3-0.9 GM/100ML-% IV SOLN
INTRAVENOUS | Status: AC
  Filled 2021-02-11: qty 100

## 2021-02-11 MED ORDER — OXYTOCIN-SODIUM CHLORIDE 30-0.9 UT/500ML-% IV SOLN
INTRAVENOUS | Status: AC
  Filled 2021-02-11: qty 500

## 2021-02-11 MED ORDER — DEXAMETHASONE SODIUM PHOSPHATE 10 MG/ML IJ SOLN
INTRAMUSCULAR | Status: AC
  Filled 2021-02-11: qty 1

## 2021-02-11 MED ORDER — PHENYLEPHRINE HCL-NACL 20-0.9 MG/250ML-% IV SOLN
INTRAVENOUS | Status: AC
  Filled 2021-02-11: qty 250

## 2021-02-11 NOTE — Lactation Note (Signed)
This note was copied from a baby's chart. Lactation Consultation Note  Patient Name: Crystal [name] Date: 02/11/2021  Reason for consult: PCOS- see Maternal Data  Age:34 hours  Baby STS with mom and sucking on his thumb . LC offered to check diaper and changed a large wet. LC placed baby STS and assisted to latch depth achieved , few sucks and baby did not seemed interested. HNS. Baby STS with mom and LC encouraged to call with feeding cues.  Lc provided the [facility] brochure with resources.   Maternal Data Has patient been taught Hand Expression?: Yes Does the patient have breastfeeding experience prior to this delivery?: No PCOS - per mom several breast changes with pregnancy.  Feeding Mother's Current Feeding Choice: Breast Milk  LATCH Score Latch: Repeated attempts needed to sustain latch, nipple held in mouth throughout feeding, stimulation needed to elicit sucking reflex.  Audible Swallowing: None  Type of Nipple: Everted at rest and after stimulation  Comfort (Breast/Nipple): Soft / non-tender  Hold (Positioning): Assistance needed to correctly position infant at breast and maintain latch.  LATCH Score: 6   Lactation Tools Discussed/Used    Interventions Interventions: Breast feeding basics reviewed;Assisted with latch;Skin to skin;Breast massage;Hand express;Adjust position;Support pillows;Position options;Education  Discharge Pump: Personal;DEBP WIC Program: No  Consult Status Consult Status: Follow-up Date: 02/11/21 Follow-up type: In-patient    Crystal [name] 02/11/2021, 8:53 AM

## 2021-02-11 NOTE — Anesthesia Postprocedure Evaluation (Signed)
Anesthesia Post Note  Patient: Crystal [name]  Procedure(s) Performed: Repeat CESAREAN SECTION (N/A )     Patient location during evaluation: PACU Anesthesia Type: Spinal Level of consciousness: awake and alert and oriented Pain management: pain level controlled Vital Signs Assessment: post-procedure vital signs reviewed and stable Respiratory status: spontaneous breathing, nonlabored ventilation and respiratory function stable Cardiovascular status: blood pressure returned to baseline Postop Assessment: no apparent nausea or vomiting, spinal receding, no headache and no backache Anesthetic complications: no   No complications documented.  Last Vitals:  Vitals:   02/11/21 0145 02/11/21 0200  BP: 122/63   Pulse: 90 85  Resp: (!) 21 (!) 22  Temp:    SpO2: 100% 100%     LLE Motor Response: Purposeful movement (02/11/21 0200) LLE Sensation: Numbness (02/11/21 0200) RLE Motor Response: Purposeful movement (02/11/21 0200) RLE Sensation: Numbness (02/11/21 0200)     Epidural/Spinal Function Cutaneous sensation: Able to Discern Pressure (02/11/21 0200), Patient able to flex knees: Yes (02/11/21 0200), Patient able to lift hips off bed: No (02/11/21 0200), Back pain beyond tenderness at insertion site: No (02/11/21 0200), Progressively worsening motor and/or sensory loss: No (02/11/21 0200), Bowel and/or bladder incontinence post epidural: No (02/11/21 0200)  Kaylyn Layer

## 2021-02-11 NOTE — Op Note (Addendum)
Cesarean Section Procedure Note   Crystal [name]  02/10/2021  Indications: active labor, term, h/o c/s x2   Pre-operative Diagnosis: Previous Cesarean Section x 2; active labor RLTCS, LOA Post-operative Diagnosis: Same, extensive adhesive disease  Surgeon: Surgeon(s) and Role:    * Saranya Harlin, Candace Gallus, MD - Primary   Assistants: Arlan Organ, CNM  Anesthesia: spinal   Procedure Details:  The patient was seen in the Holding Room. The risks, benefits, complications, treatment options, and expected outcomes were discussed with the patient. The patient concurred with the proposed plan, giving informed consent. identified as Crystal [name] and the procedure verified as C-Section Delivery. A Time Out was held and the above information confirmed.  After induction of anesthesia, the patient was draped and prepped in the usual sterile manner, foley was draining urine well.  A pfannenstiel incision was made and carried down through the subcutaneous tissue to the fascia. There was scarring of the fascia.  Fascial incision was made and extended transversely with cautery, scalpel and mayo scissors. The fascia was separated from the underlying rectus tissue superiorly and inferiorly with extensive adhesions at this point both superiorly and inferiorly. Careful dissection was performed to achieve this. The peritoneum was identified and an opening identified.  There were filmy adhesions of peritoneum to uterus and also some omental adhesions to peritoneum that were dissected away with gentle movement as the adhesions were filmy. Peritoneal incision was extended longitudinally.  The bladder blad was inserted.  The utero-vesical peritoneal reflection was incised transversely with metzenbaum scissors and the bladder flap was bluntly freed from the lower uterine segment. A low transverse uterine incision was made. Delivered from cephalic presentation was a female infant with vigorous cry. Apgar scores of 9 at one minute  and 9 at five minutes. Delayed cord clamping done at 1 minute and baby handed to NICU team in attendance. Cord ph was not sent. Cord blood was obtained for evaluation. The placenta was removed Intact and appeared normal. The uterine outline, tubes and ovaries appeared normal}. The uterine incision was closed with running locked sutures of . A second imbricating layer sutured with the same suture.   Hemostasis was observed after placing figure of eight stitches and a small running stitch along the uterine incision. The bladder blade was removed. Unable to close peritoneum from prior adhesions/scarring.  The rectus muscles were reaporximated at the midline with inturrupted suture.  The fascia was then reapproximated with running sutures of 0Vicryl. T. The skin was closed with 4-0Vicryl. Hemostasis of each layer was noted prior to closure.  Instrument, sponge, and needle counts were correct prior the abdominal closure and were correct at the conclusion of the case.    Findings:viable female infant, apgars 9/9   Estimated Blood Loss: by titron   Total IV Fluids:   Urine Output: OF clear urine  Specimens: none  Complications: no complications  Disposition: PACU - hemodynamically stable.   Maternal Condition: stable   Baby condition / location:  Couplet care / Skin to Skin  Attending Attestation: I performed the procedure.   Signed: Surgeon(s): Amado Nash Candace Gallus, MD

## 2021-02-11 NOTE — Anesthesia Procedure Notes (Signed)
Spinal  Patient location during procedure: OR Start time: 02/10/2021 11:36 PM End time: 02/10/2021 11:40 PM Reason for block: surgical anesthesia Staffing Performed: anesthesiologist  Anesthesiologist: Kaylyn Layer, MD Preanesthetic Checklist Completed: patient identified, IV checked, risks and benefits discussed, monitors and equipment checked, pre-op evaluation and timeout performed Spinal Block Patient position: sitting Prep: DuraPrep and site prepped and draped Patient monitoring: heart rate, continuous pulse ox and blood pressure Approach: midline Location: L3-4 Injection technique: single-shot Needle Needle type: Pencan  Needle gauge: 24 G Needle length: 10 cm Assessment Sensory level: T4 Events: CSF return Additional Notes Risks, benefits, and alternative discussed. Patient gave consent to procedure. Prepped and draped in sitting position. Clear CSF obtained after two needle redirections. Positive terminal aspiration. No pain or paraesthesias with injection. Patient tolerated procedure well. Vital signs stable. Amalia Greenhouse, MD

## 2021-02-11 NOTE — Transfer of Care (Signed)
Immediate Anesthesia Transfer of Care Note  Patient: Crystal [name]  Procedure(s) Performed: Repeat CESAREAN SECTION (N/A )  Patient Location: PACU  Anesthesia Type:Spinal  Level of Consciousness: awake, alert , oriented and patient cooperative  Airway & Oxygen Therapy: Patient Spontanous Breathing  Post-op Assessment: Report given to RN and Post -op Vital signs reviewed and stable  Post vital signs: Reviewed and stable  Last Vitals:  Vitals Value Taken Time  BP 119/63 02/11/21 0125  Temp    Pulse 82 02/11/21 0128  Resp 13 02/11/21 0128  SpO2 100 % 02/11/21 0128  Vitals shown include unvalidated device data.  Last Pain:  Vitals:   02/10/21 2029  PainSc: 8          Complications: No complications documented.

## 2021-02-11 NOTE — Social Work (Signed)
MOB was referred for history of depression and anxiety.   * Referral screened out by Clinical Social Worker because none of the following criteria appear to apply:  ~ History of anxiety/depression during this pregnancy, or of post-partum depression following prior delivery. No concerns noted in prenatal care. ~ Diagnosis of anxiety and/or depression within last 3 years. CSW reviewed chart and notes diagnosis in 2018 or prior. OR * MOB's symptoms currently being treated with medication and/or therapy.  Please contact the Clinical Social Worker if needs arise, by MOB request, or if MOB scores greater than 9/yes to question 10 on Edinburgh Postpartum Depression Screen.  Amier Hoyt, LCSWA Clinical Social Work [facility]  [phone]  

## 2021-02-11 NOTE — Addendum Note (Signed)
Addendum  created 02/11/21 0206 by Kaylyn Layer, MD   Order sets accessed

## 2021-02-12 NOTE — Progress Notes (Signed)
Subjective: Postpartum Day 1: Cesarean Delivery repeat   Patient doing well this morning. States pain has been adequately controlled with medications. She has been up and ambulating without dizziness. States VB has slowed down as well. Tolerating regular diet, denies N/V. Spontaneously voiding without difficulties and passing flatus. Denies HA, vision changes, CP, or SOB.   Baby boy is doing well at bedside, working on breastfeeding. Desires neonatal circumcision.  Objective: Patient Vitals for the past 24 hrs:  BP Temp Temp src Pulse Resp SpO2  02/12/21 0519 115/66 98.6 F (37 C) Oral 82 18 100 %  02/11/21 2000 113/60 98.3 F (36.8 C) Oral 72 18 100 %    Intake/Output Summary (Last 24 hours) at 02/12/2021 1035 Last data filed at 02/12/2021 0400 Gross per 24 hour  Intake 0 ml  Output 900 ml  Net -900 ml     Physical Exam:  General: alert and mild distress Lochia: appropriate Uterine Fundus: firm Incision: no significant drainage, abdominal dressing is c/d/i DVT Evaluation: No evidence of DVT seen on physical exam.  Recent Labs    02/10/21 2128 02/11/21 0514  HGB 9.3* 8.0*  HCT 33.2* 28.1*    Assessment/Plan: Status post Cesarean section. Doing well postoperatively.  Continue current care. JAZLYNNE MILLINER [id number] POD#1 sp repeat cesarean section @ [redacted]w[redacted]d after presenting with SROM/labor 1. PPC: cont current pain regimen, encourage ambulation, and regular diet 2. Acute on chronic blood loss anemia, s/p IV Venofer 6/3, started on PO Niferex today. Asymptomatic and VSS. Plan for DC home on PO Iron  3. Lactation consultation PRN 4. Plan for circumcision today 5. RH POS, Rubella Immune 6. Routine postpartum care, anticipate DC home tmw POD2  Linnette Panella A Annalisa Colonna 02/12/2021, 10:33 AM

## 2021-02-12 NOTE — Lactation Note (Signed)
This note was copied from a baby's chart. Lactation Consultation Note  Patient Name: Crystal [name] [id number] Date: 02/12/2021 Reason for consult: Follow-up assessment;Mother's request;Difficult latch;Early term 37-38.6wks Age:34 hours  Mom unsure if infant getting enough and gave formula Enfamil 51ml at last feeding.   On arrival infant showing cues, worked on Administrator. Mom has large nipples and compressible breast tissue. Also, Mom using pacifier may cause some nipple confusion. We talked about removing his outfit and keeping him STS during feeding, working on different positions and compressing breasts to increase flow. Infant had large volume of formula so only latched with a few sucks before falling asleep.   Mom to call for assistance for next feeding with latching to try laid back position with breast compression for next feeding.   Mom provided with breastfeeding supplementation guide based on hrs of age since delivery to offer less formula after latching to sustain her breastfeeding.   Plan 1. To feed based on cues 8-12x in 24 hr period no more than 4 hrs without an attempt. STS offering both breasts with compression noting signs of milk transfer.           2. Mom to supplement with EBM first followed by formula based on supplementation guide provided.          3. DEBP q 3 hrs for 15 minutes   All questions answered at the end of the visit.  Maternal Data Has patient been taught Hand Expression?: Yes  Feeding Mother's Current Feeding Choice: Breast Milk and Formula Nipple Type: Slow - flow  LATCH Score Latch: Repeated attempts needed to sustain latch, nipple held in mouth throughout feeding, stimulation needed to elicit sucking reflex.  Audible Swallowing: None  Type of Nipple: Everted at rest and after stimulation  Comfort (Breast/Nipple): Soft / non-tender  Hold (Positioning): Assistance needed to correctly position infant at breast and maintain latch.  LATCH Score:  6   Lactation Tools Discussed/Used Tools: Pump;Flanges Flange Size: 24 Breast pump type: Double-Electric Breast Pump Pump Education: Setup, frequency, and cleaning;Milk Storage Reason for Pumping: increase stimulation Pumping frequency: every 3 hrs for 15 minutes  Interventions Interventions: Breast feeding basics reviewed;Adjust position;DEBP;Assisted with latch;Support pillows;Skin to skin;Position options;Education;Breast massage;Expressed milk;Hand express;Breast compression  Discharge Pump: Personal WIC Program: No  Consult Status Consult Status: Follow-up Date: 02/13/21 Follow-up type: In-patient    Taos Tapp  Nicholson-Springer 02/12/2021, 5:10 PM

## 2021-02-13 MED ORDER — POLYSACCHARIDE IRON COMPLEX 150 MG PO CAPS: 150.0000 mg | ORAL_CAPSULE | Freq: Every day | ORAL | 3 refills | Status: DC

## 2021-02-13 MED ORDER — IBUPROFEN 600 MG PO TABS: 600.0000 mg | ORAL_TABLET | Freq: Four times a day (QID) | ORAL | 0 refills | Status: DC | PRN

## 2021-02-13 MED ORDER — ACETAMINOPHEN 500 MG PO TABS: 1000.0000 mg | ORAL_TABLET | Freq: Four times a day (QID) | ORAL | 0 refills | Status: DC | PRN

## 2021-02-13 MED ORDER — MAGNESIUM OXIDE -MG SUPPLEMENT 400 (240 MG) MG PO TABS: 400.0000 mg | ORAL_TABLET | Freq: Every day | ORAL | Status: DC

## 2021-02-13 NOTE — Lactation Note (Signed)
This note was copied from a baby's chart. Lactation Consultation Note  Patient Name: Crystal [name] [id number] Date: 02/13/2021 Reason for consult: Follow-up assessment Age:34 hours   P3, Baby sleeping after recent formula feeding of 10 ml. Encouraged breastfeeding before offering formula to help establish milk supply. Mother denies questions or concerns. Reviewed engorgement care and monitoring voids/stools.  Maternal Data  HX PCOS  Feeding Mother's Current Feeding Choice: Breast Milk and Formula  Interventions Interventions: Education  Discharge Discharge Education: Engorgement and breast care;Warning signs for feeding baby  Consult Status Consult Status: Complete Date: 02/13/21    Dahlia Byes [facility] 02/13/2021, 8:04 AM

## 2021-02-13 NOTE — Discharge Summary (Signed)
Postpartum Discharge Summary  Date of Service updated 02/13/21     Patient Name: Crystal [name] DOB: April 28, 1986 MRN: [id number]  Date of admission: 02/10/2021 Delivery date:02/11/2021  Delivering provider: Tiana Loft E  Date of discharge: 02/13/2021  Admitting diagnosis: Previous cesarean section [Z98.891] Intrauterine pregnancy: [redacted]w[redacted]d    Secondary diagnosis:  Principal Problem:   Postpartum care following cesarean delivery 6/3 Active Problems:   Maternal anemia, with delivery - IDA w/ ABL   Previous cesarean section   Cesarean delivery delivered - repeat  Additional problems: none    Discharge diagnosis: Term Pregnancy Delivered                                              Post partum procedures:n/a Augmentation: N/A Complications: None  Hospital course: Onset of Labor With Unplanned C/S   35y.o. yo [id number] admitted in Latent Labor on 02/10/2021. Patient had a labor course significant for ROM. The patient went for cesarean section due to Elective Repeat. Delivery details as follows: Membrane Rupture Time/Date: 12:17 AM ,02/11/2021   Delivery Method:C-Section, Low Transverse  Details of operation can be found in separate operative note. Patient had an uncomplicated postpartum course.  She is ambulating,tolerating a regular diet, passing flatus, and urinating well.  Patient is discharged home in stable condition 02/13/21.  Newborn Data: Birth date:02/11/2021  Birth time:12:18 AM  Gender:Female  Living status:Living  Apgars:9 ,9  Weight:3855 g   Magnesium Sulfate received: No BMZ received: No Rhophylac:N/A MMR:N/A T-DaP:Given prenatally  Physical exam  Vitals:   02/12/21 0519 02/12/21 1501 02/12/21 2032 02/13/21 0613  BP: 115/66 (!) 108/57 120/71 131/67  Pulse: 82 79 86 80  Resp: _0 Temp: 98.6 F (37 C) 98.2 F (36.8 C) 98.5 F (36.9 C) 98.8 F (37.1 C)  TempSrc: Oral Axillary Oral Oral  SpO2: 100%  100% 100%  Weight:      Height:        General: alert and no distress Lochia: appropriate Uterine Fundus: firm Incision: c/d/i pressure bandage and honeycomb dressing changed and replaced DVT Evaluation: No evidence of DVT seen on physical exam. Labs: Lab Results  Component Value Date   WBC 17.6 (H) 02/11/2021   HGB 8.0 (L) 02/11/2021   HCT 28.1 (L) 02/11/2021   MCV 71.3 (L) 02/11/2021   PLT 275 02/11/2021   CMP Latest Ref Rng & Units 05/16/2020  Glucose 70 - 99 mg/dL 93  BUN 6 - 20 mg/dL 8  Creatinine 0.44 - 1.00 mg/dL 0.87  Sodium 135 - 145 mmol/L 138  Potassium 3.5 - 5.1 mmol/L 4.0  Chloride 98 - 111 mmol/L 108  CO2 22 - 32 mmol/L 24  Calcium 8.9 - 10.3 mg/dL 8.9  Total Protein 6.5 - 8.1 g/dL 6.6  Total Bilirubin 0.3 - 1.2 mg/dL 0.6  Alkaline Phos 38 - 126 U/L 35(L)  AST 15 - 41 U/L 21  ALT 0 - 44 U/L 16   Edinburgh Score: Edinburgh Postnatal Depression Scale Screening Tool 02/11/2021  I have been able to laugh and see the funny side of things. 0  I have looked forward with enjoyment to things. 1  I have blamed myself unnecessarily when things went wrong. 1  I have been anxious or worried for no good reason. 2  I have felt scared or  panicky for no good reason. 1  Things have been getting on top of me. 0  I have been so unhappy that I have had difficulty sleeping. 1  I have felt sad or miserable. 0  I have been so unhappy that I have been crying. 1  The thought of harming myself has occurred to me. 0  Edinburgh Postnatal Depression Scale Total 7      After visit meds:  Allergies as of 02/13/2021      Reactions   Latex Itching      Medication List    STOP taking these medications   progesterone 200 MG capsule Commonly known as: Prometrium     TAKE these medications   acetaminophen 500 MG tablet Commonly known as: TYLENOL Take 2 tablets (1,000 mg total) by mouth every 6 (six) hours as needed.   fluticasone 50 MCG/ACT nasal spray Commonly known as: FLONASE Place 1 spray into both nostrils  daily.   ibuprofen 600 MG tablet Commonly known as: ADVIL Take 1 tablet (600 mg total) by mouth every 6 (six) hours as needed.   iron polysaccharides 150 MG capsule Commonly known as: NIFEREX Take 1 capsule (150 mg total) by mouth daily. Start taking on: February 14, 2021   magnesium oxide 400 (240 Mg) MG tablet Commonly known as: MAG-OX Take 1 tablet (400 mg total) by mouth daily. Start taking on: February 14, 2021   prenatal vitamin w/FE, FA 27-1 MG Tabs tablet Take 1 tablet by mouth daily at 12 noon.            Discharge Care Instructions  (From admission, onward)         Start     Ordered   02/13/21 0000  Discharge wound care:       Comments: Sitz baths 2 times /day with warm water x 1 week. May add herbals: 1 ounce dried comfrey leaf* 1 ounce calendula flowers 1 ounce lavender flowers  Supplies can be found online at [employer] sources at [employer], [employer]  1/2 ounce dried uva ursi leaves 1/2 ounce witch hazel blossoms (if you can find them) 1/2 ounce dried sage leaf 1/2 cup sea salt Directions: Bring 2 quarts of water to a boil. Turn off heat, and place 1 ounce (approximately 1 large handful) of the above mixed herbs (not the salt) into the pot. Steep, covered, for 30 minutes.  Strain the liquid well with a fine mesh strainer, and discard the herb material. Add 2 quarts of liquid to the tub, along with the 1/2 cup of salt. This medicinal liquid can also be made into compresses and peri-rinses.   02/13/21 1056           Discharge home in stable condition Infant Feeding: Breast Infant Disposition:home with mother Discharge instruction: per After Visit Summary and Postpartum booklet. Activity: Advance as tolerated. Pelvic rest for 6 weeks.  Diet: routine diet Anticipated Birth Control: Unsure Postpartum Appointment:6 weeks Additional Postpartum F/U: n/a Future Appointments: Future Appointments  Date Time Provider [place]   02/15/2021  9:00 AM [id number] None   Follow up Visit: Patient to be seen in 6 weeks at [facility] with primary provider Dr. Benjie Karvonen     02/13/2021 Benedicta Sultan A Prynce Jacober, DO

## 2021-02-15 ENCOUNTER — Inpatient Hospital Stay (HOSPITAL_COMMUNITY): Admission: RE | Admit: 2021-02-15 | Payer: 59 | Source: Ambulatory Visit

## 2021-02-16 ENCOUNTER — Other Ambulatory Visit (HOSPITAL_COMMUNITY): Payer: 59

## 2021-02-16 ENCOUNTER — Encounter (HOSPITAL_COMMUNITY)
Admission: RE | Admit: 2021-02-16 | Discharge: 2021-02-16 | Disposition: A | Discharge status: Home / Self Care | Payer: 59 | Source: Ambulatory Visit | Attending: Obstetrics & Gynecology | Admitting: Obstetrics & Gynecology

## 2021-02-18 ENCOUNTER — Inpatient Hospital Stay (HOSPITAL_COMMUNITY): Admission: AD | Admit: 2021-02-18 | Payer: 59 | Source: Home / Self Care | Admitting: Obstetrics & Gynecology

## 2021-04-14 IMAGING — US US OB < 14 WEEKS - US OB TV
1 series · 15 of 28 positions shown · non-contrast
Comparison: None.

CLINICAL DATA: Bleeding.

Quantitative beta HCG 250.
EXAM:
OBSTETRIC <14 WK US AND TRANSVAGINAL OB US
TECHNIQUE: Both transabdominal and transvaginal ultrasound examinations were
performed for complete evaluation of the gestation as well as the
maternal uterus, adnexal regions, and pelvic cul-de-sac.
Transvaginal technique was performed to assess early pregnancy.

[Series 1: us ob < 14 weeks - us ob tv · 65 acquisitions, 15 frames shown]
[im 1/65]
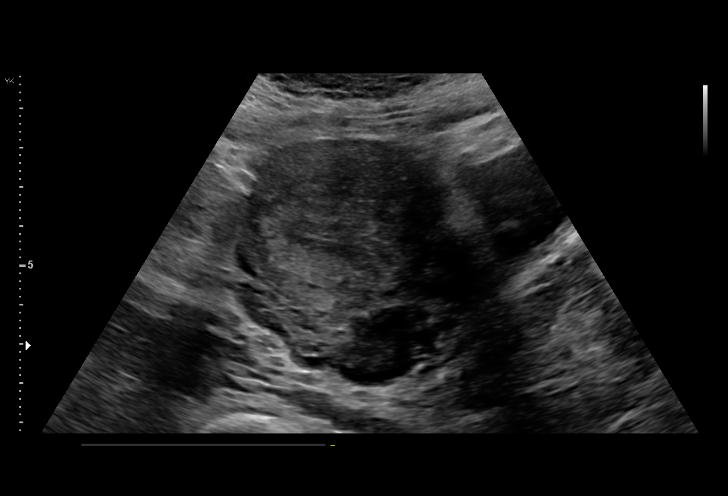
[im 5/65]
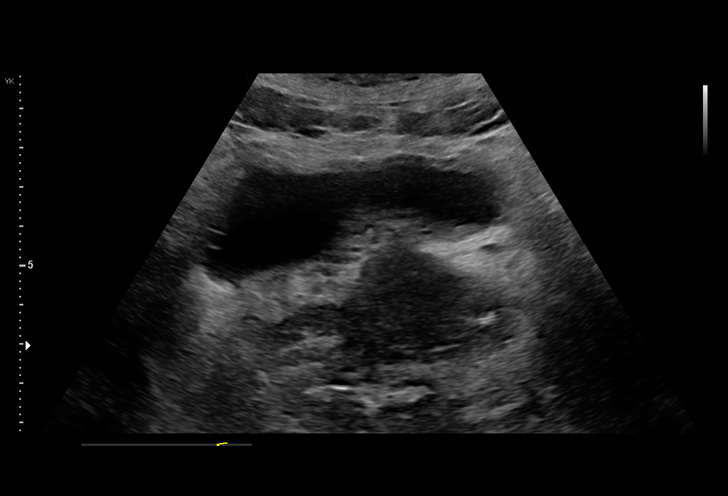
[im 10/65]
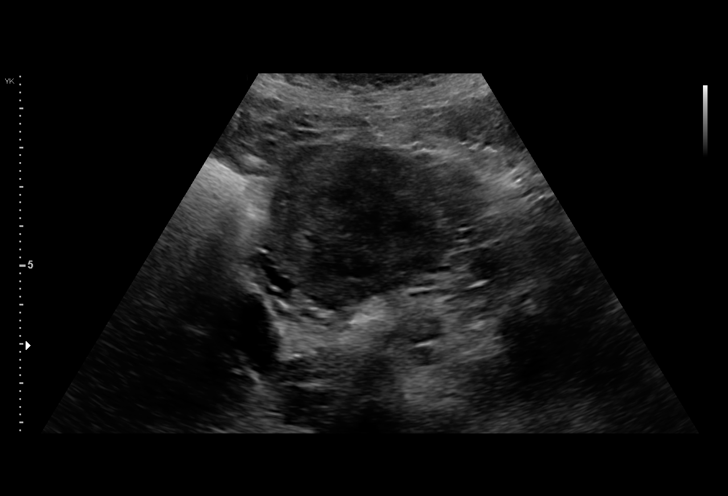
[im 15/65]
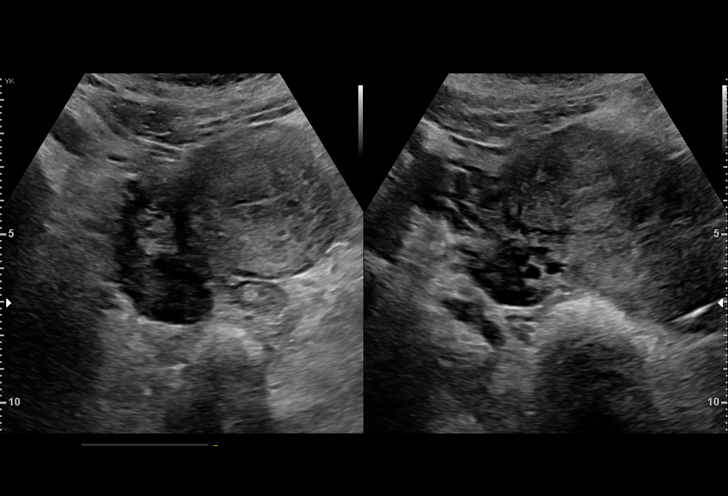
[im 19/65]
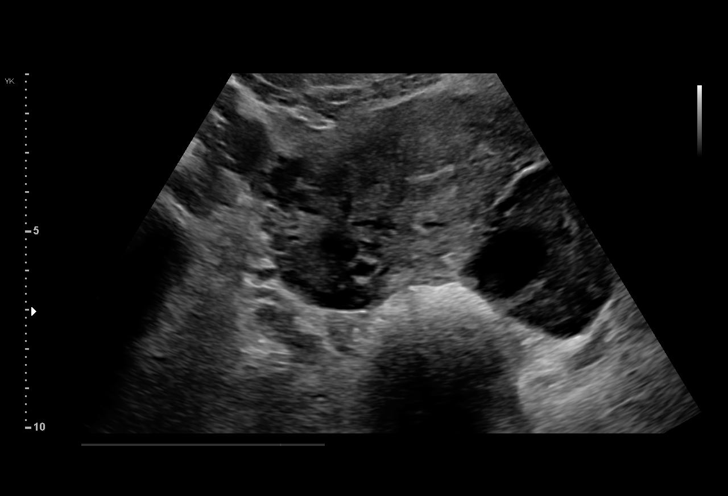
[im 24/65]
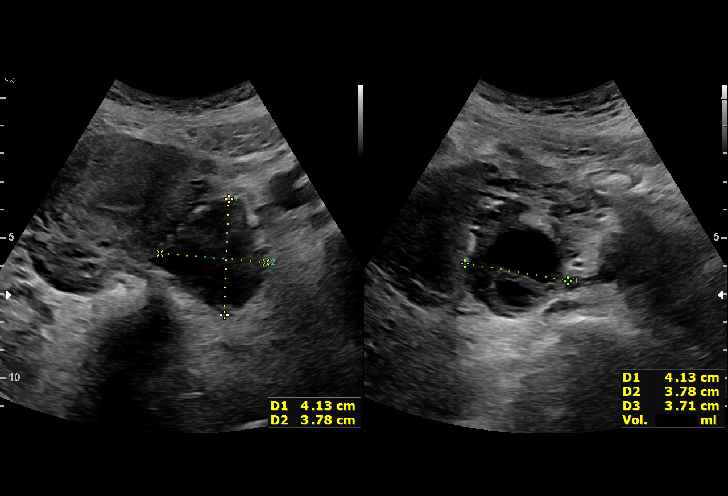
[im 29/65]
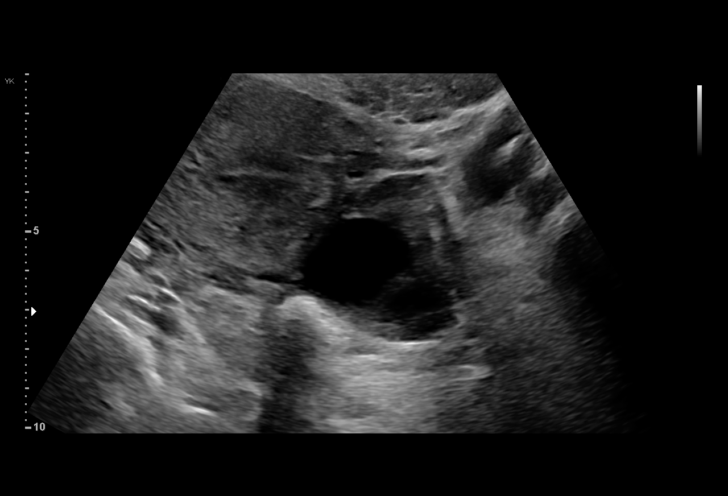
[im 34/65]
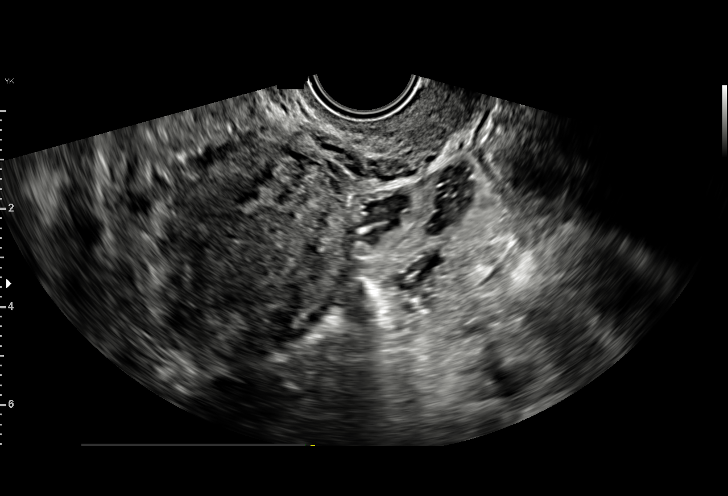
[im 36/65]
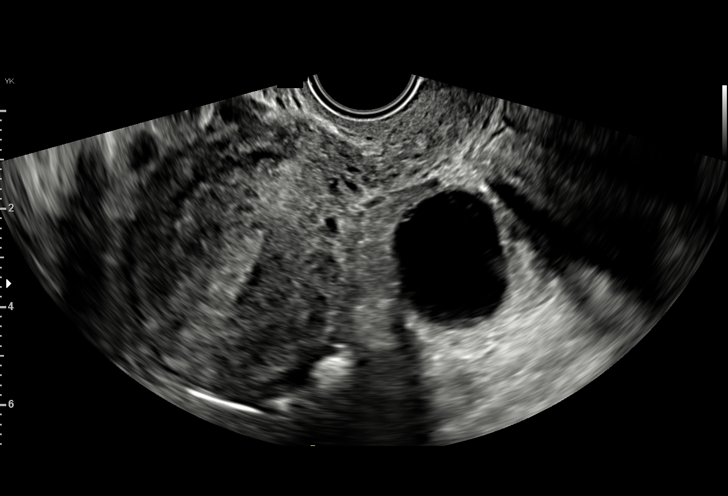
[im 41/65]
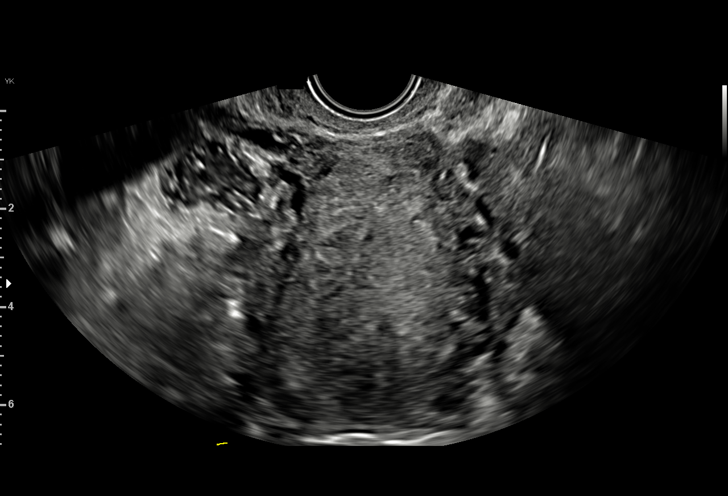
[im 46/65]
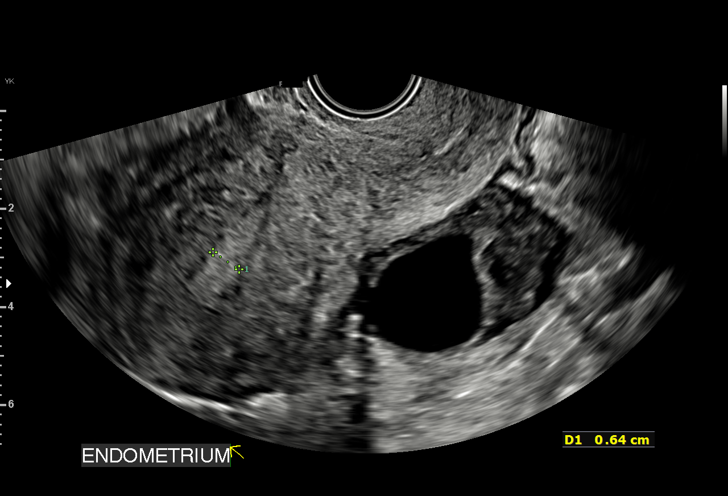
[im 50/65]
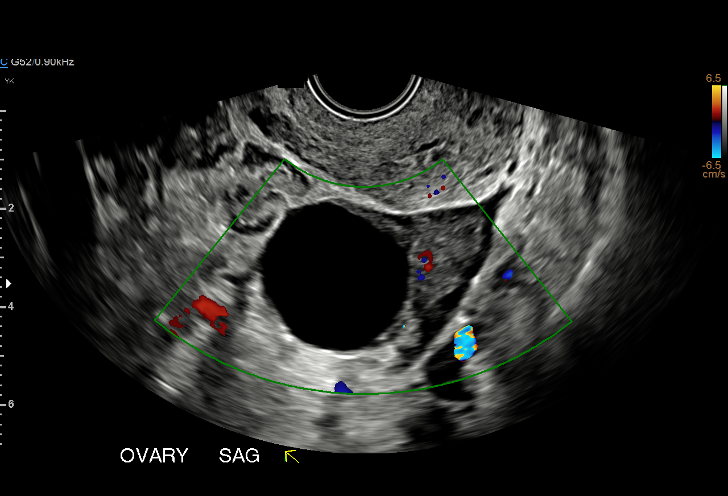
[im 55/65]
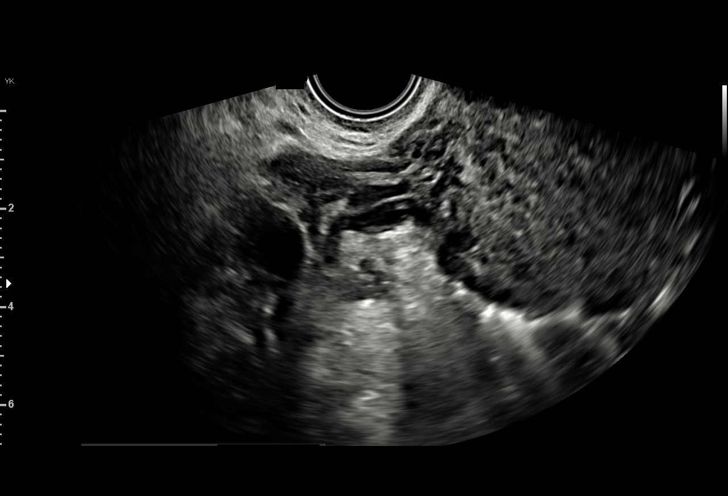
[im 60/65]
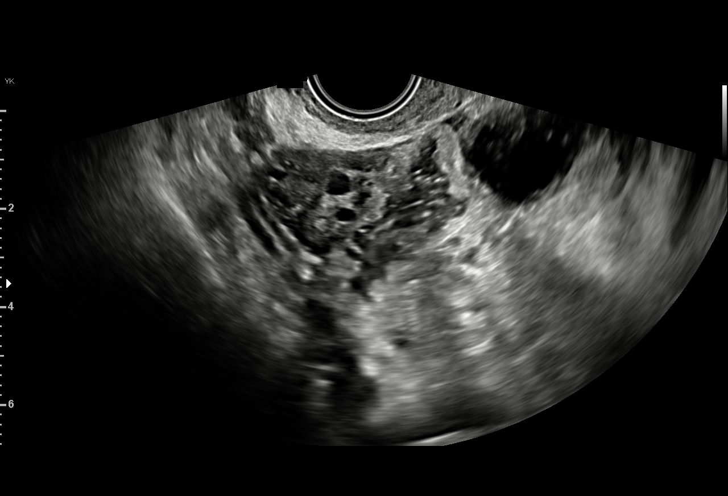
[im 65/65]
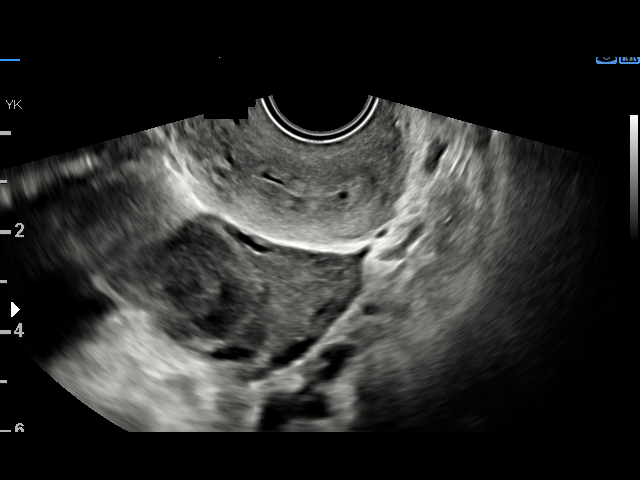

[15 of 28 positions shown; findings below may reference images not displayed]

FINDINGS: Intrauterine gestational sac: None

Yolk sac:  Not Visualized.

Embryo:  Not Visualized.

Maternal uterus/adnexae:

Right ovary measures 4.0 x 2.2 x 2.3 cm, for a volume of 10.9 mL.

Left ovary measures 4.6 x 4.4 x 2.8 cm for volume of 14.7 mL. There
is a 3.3 cm probable corpus luteal cyst on the left ovary.

The endometrium measures 6.4 mm in thickness.
IMPRESSION: No intrauterine gestational sac or embryo visualized.

Findings are suspicious but not yet definitive for failed pregnancy.
Recommend follow-up US in 10-14 days for definitive diagnosis. This
recommendation follows SRU consensus guidelines: Diagnostic Criteria
for Nonviable Pregnancy Early in the First Trimester. N Engl J Med

## 2021-06-04 IMAGING — US US OB < 14 WEEKS - US OB TV
1 series · 15 of 28 positions shown · non-contrast
Comparison: Prior gestational ultrasound 05/16/2020

CLINICAL DATA: Pregnancy of unknown anatomic location, quantitative
beta hCG on 06/23/2020 = [DATE], G6P2 SAB 4

EXAM:
OBSTETRIC <14 WK US AND TRANSVAGINAL OB US
TECHNIQUE: Both transabdominal and transvaginal ultrasound examinations were
performed for complete evaluation of the gestation as well as the
maternal uterus, adnexal regions, and pelvic cul-de-sac.
Transvaginal technique was performed to assess early pregnancy.

[Series 1: us ob < 14 weeks - us ob tv · 90 acquisitions, 15 frames shown]
[im 1/90]
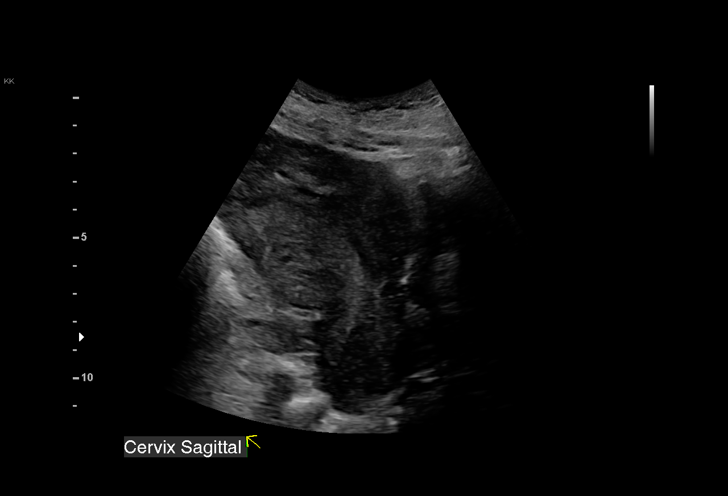
[im 7/90]
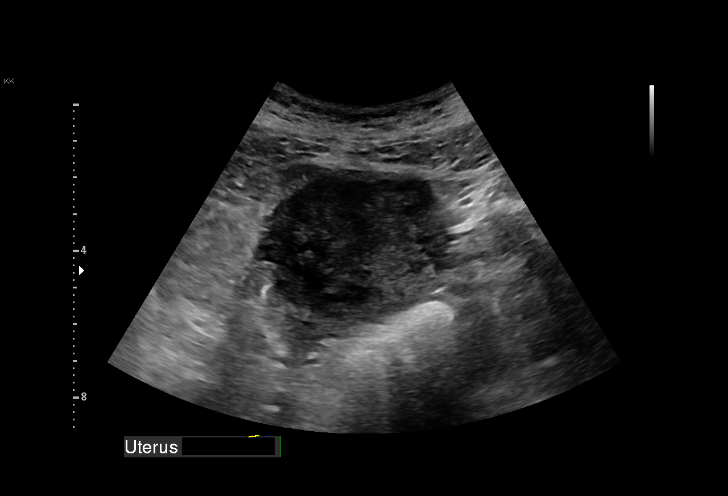
[im 14/90]
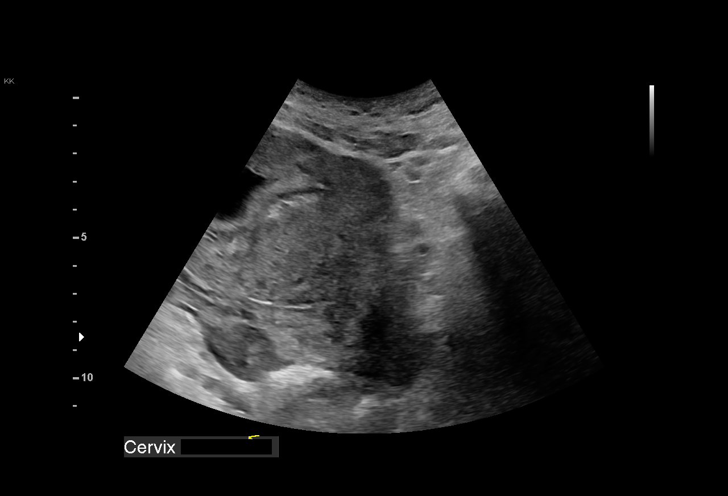
[im 20/90]
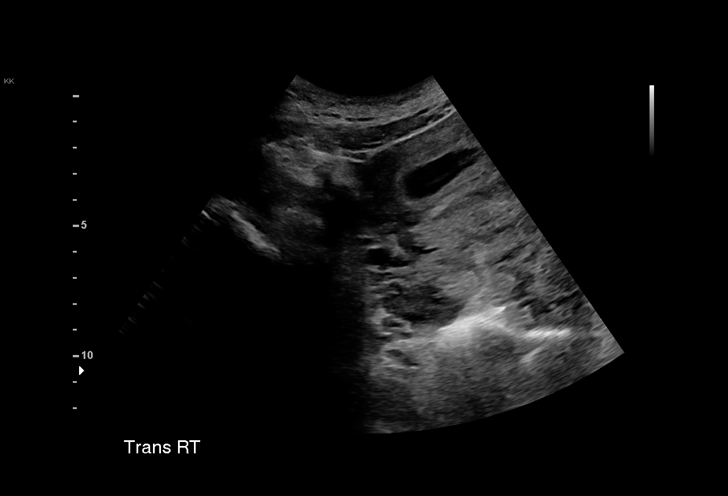
[im 27/90]
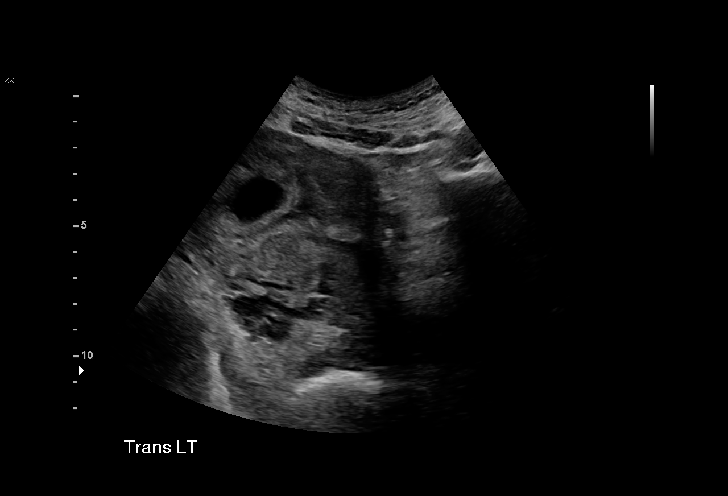
[im 33/90]
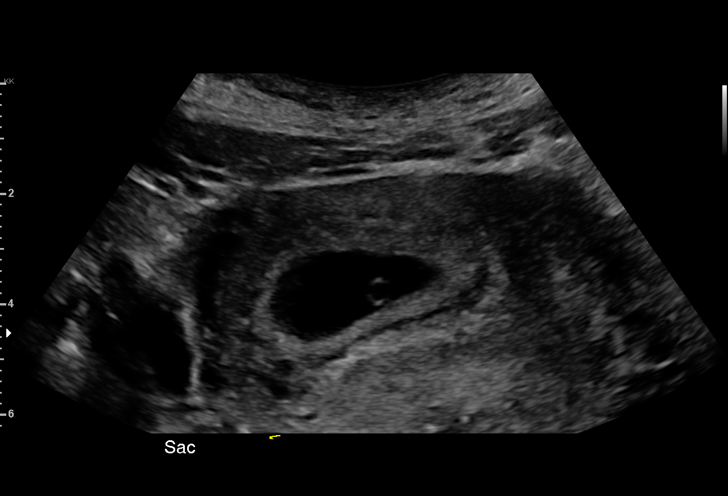
[im 40/90]
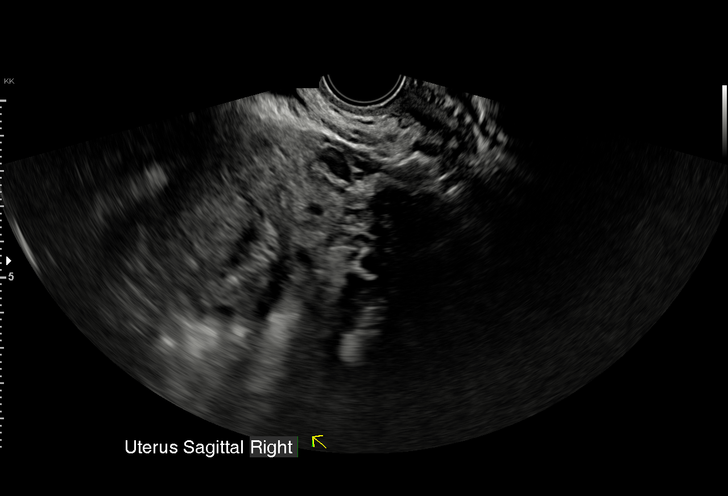
[im 47/90]
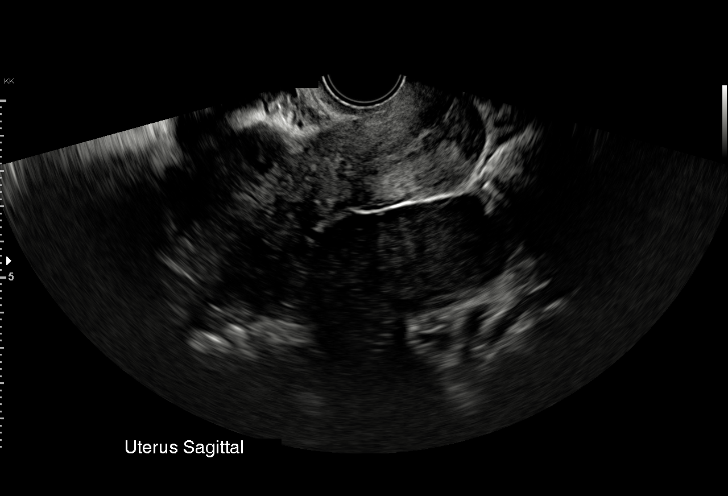
[im 50/90]
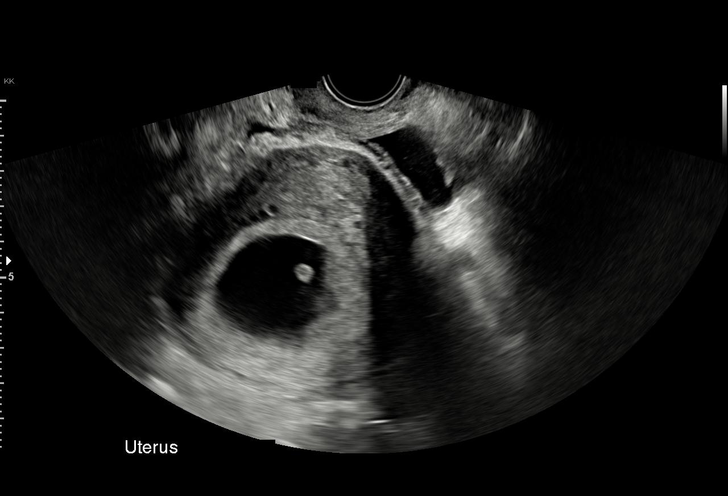
[im 57/90]
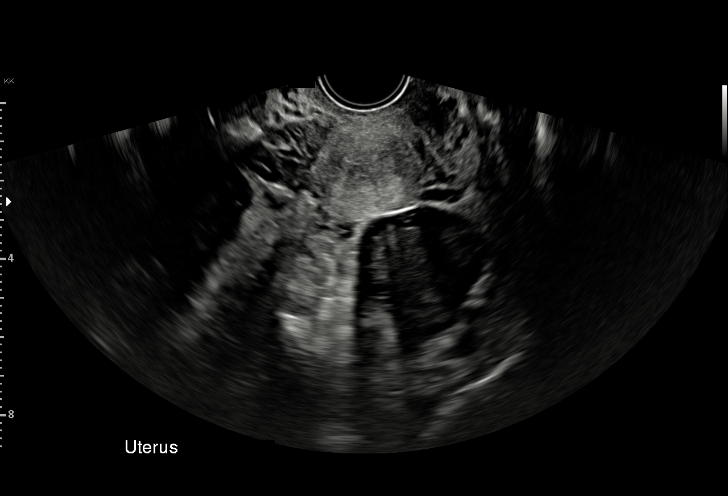
[im 63/90]
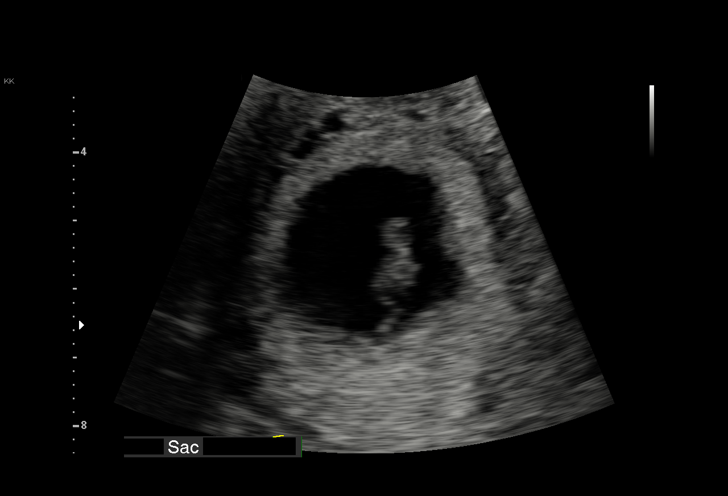
[im 70/90]
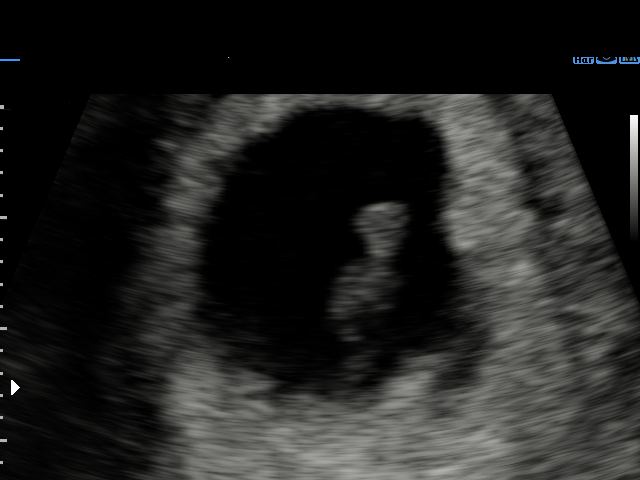
[im 76/90]
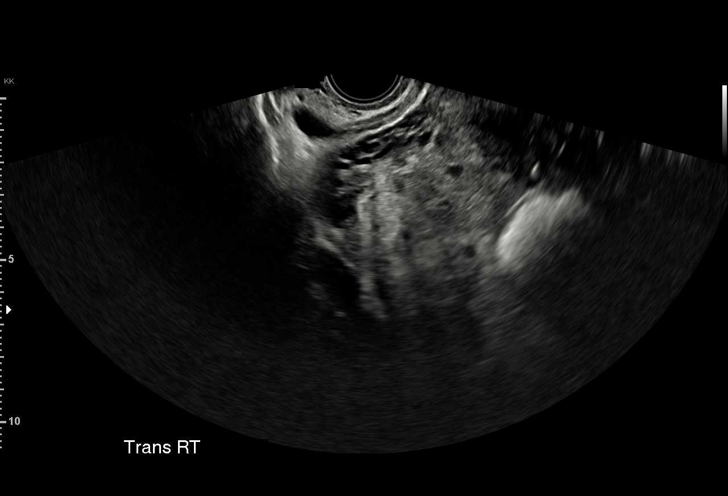
[im 83/90]
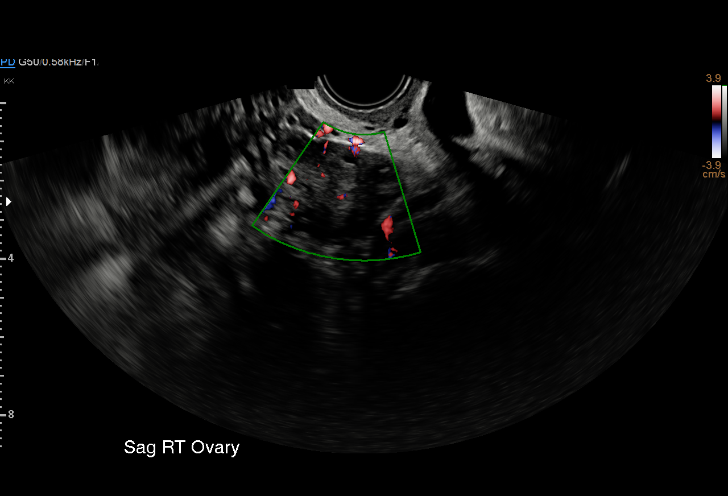
[im 90/90]
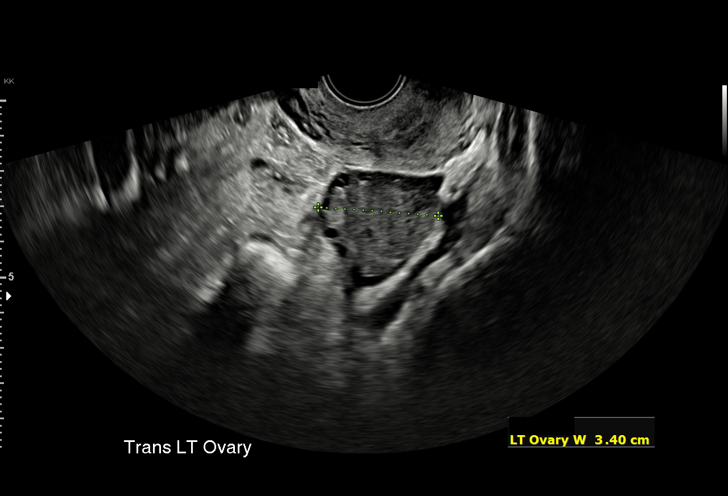

[15 of 28 positions shown; findings below may reference images not displayed]

FINDINGS: Intrauterine gestational sac: Single

Yolk sac:  Visualized.

Embryo:  Visualized.

Cardiac Activity: Visualized.

Heart Rate: 114 bpm

CRL:  9.4 mm   6 w   6 d                  US EDC: 02/23/2021

Subchorionic hemorrhage: Trace subchorionic hemorrhage seen towards
the implantation site (image 32).

Maternal uterus/adnexae: Normal anteverted maternal uterus. No other
uterine abnormalities. No worrisome adnexal lesions. Probable corpus
luteum visualized in the left ovary with peripheral color flow. No
free fluid in the pelvis.
IMPRESSION: Single intrauterine gestation at an estimated gestational age of 6
weeks, 6 days by crown-rump length measurement.

Trace subchorionic hemorrhage is noted.

No other acute sonographic complication.

## 2021-11-03 DIAGNOSIS — B029 Zoster without complications: Secondary | ICD-10-CM | POA: Insufficient documentation

## 2021-11-28 DIAGNOSIS — Z8759 Personal history of other complications of pregnancy, childbirth and the puerperium: Secondary | ICD-10-CM | POA: Insufficient documentation

## 2021-11-28 DIAGNOSIS — O09529 Supervision of elderly multigravida, unspecified trimester: Secondary | ICD-10-CM | POA: Insufficient documentation

## 2021-11-28 LAB — OB RESULTS CONSOLE RUBELLA ANTIBODY, IGM: Rubella: IMMUNE

## 2021-11-28 LAB — OB RESULTS CONSOLE HEPATITIS B SURFACE ANTIGEN: Hepatitis B Surface Ag: NEGATIVE

## 2021-11-28 LAB — OB RESULTS CONSOLE HIV ANTIBODY (ROUTINE TESTING): HIV: NONREACTIVE

## 2021-11-28 LAB — OB RESULTS CONSOLE RPR: RPR: NONREACTIVE

## 2021-11-28 LAB — OB RESULTS CONSOLE GC/CHLAMYDIA
Chlamydia: NEGATIVE
Neisseria Gonorrhea: NEGATIVE

## 2021-11-28 LAB — HEPATITIS C ANTIBODY: HCV Ab: NEGATIVE

## 2022-02-03 ENCOUNTER — Encounter (HOSPITAL_COMMUNITY): Payer: Self-pay | Admitting: *Deleted

## 2022-03-09 DIAGNOSIS — O0990 Supervision of high risk pregnancy, unspecified, unspecified trimester: Secondary | ICD-10-CM | POA: Insufficient documentation

## 2022-03-28 ENCOUNTER — Other Ambulatory Visit: Payer: Self-pay | Admitting: Obstetrics & Gynecology

## 2022-03-28 DIAGNOSIS — O99013 Anemia complicating pregnancy, third trimester: Secondary | ICD-10-CM

## 2022-03-30 ENCOUNTER — Non-Acute Institutional Stay (HOSPITAL_COMMUNITY)
Admission: RE | Admit: 2022-03-30 | Discharge: 2022-03-30 | Disposition: A | Discharge status: Home / Self Care | Payer: 59 | Source: Ambulatory Visit | Attending: Internal Medicine | Admitting: Internal Medicine

## 2022-03-30 DIAGNOSIS — Z3A00 Weeks of gestation of pregnancy not specified: Secondary | ICD-10-CM | POA: Insufficient documentation

## 2022-03-30 DIAGNOSIS — O99013 Anemia complicating pregnancy, third trimester: Secondary | ICD-10-CM | POA: Diagnosis present

## 2022-03-30 MED ORDER — DIPHENHYDRAMINE HCL 25 MG PO CAPS
25.0000 mg | ORAL_CAPSULE | ORAL | Status: DC
  Administered 2022-03-30: 25 mg via ORAL
  Filled 2022-03-30: qty 1

## 2022-03-30 MED ORDER — SODIUM CHLORIDE 0.9 % IV SOLN: INTRAVENOUS | Status: DC | PRN

## 2022-03-30 MED ORDER — ACETAMINOPHEN 500 MG PO TABS
500.0000 mg | ORAL_TABLET | ORAL | Status: DC
  Administered 2022-03-30: 500 mg via ORAL
  Filled 2022-03-30: qty 1

## 2022-03-30 MED ORDER — SODIUM CHLORIDE 0.9 % IV SOLN
500.0000 mg | INTRAVENOUS | Status: DC
  Administered 2022-03-30: 500 mg via INTRAVENOUS
  Filled 2022-03-30: qty 25

## 2022-03-30 NOTE — Progress Notes (Signed)
PATIENT CARE CENTER NOTE   Diagnosis: Anemia complicating pregnancy in third trimester [O99.013]   Provider: Shea Evans, MD   Procedure: Venofer infusion    Note:  Patient received Venofer 500 mg infusion (dose # 1 of 2) via PIV. Pre-medications given per order. Patient tolerated infusion well with no adverse reaction. Vital signs stable. Discharge instructions given. Patient to come back in 2 weeks for second infusion and will schedule next appointment at the front desk. Alert, oriented and ambulatory at discharge.

## 2022-05-17 ENCOUNTER — Other Ambulatory Visit: Payer: Self-pay | Admitting: Obstetrics & Gynecology

## 2022-05-18 LAB — OB RESULTS CONSOLE GBS: GBS: NEGATIVE

## 2022-05-29 ENCOUNTER — Encounter (HOSPITAL_COMMUNITY): Payer: Self-pay

## 2022-05-29 ENCOUNTER — Encounter (HOSPITAL_COMMUNITY): Payer: Self-pay | Admitting: *Deleted

## 2022-05-29 NOTE — Patient Instructions (Signed)
Crystal [name]  05/29/2022   Your procedure is scheduled on:  06/12/2022  Arrive at 15 at Entrance C on [address] at [facility]  and [employer]. You are invited to use the FREE valet parking or use the Visitor's parking deck.  Pick up the phone at the desk and dial [phone].  Call this number if you have problems the morning of surgery: [phone]  Remember:   Do not eat food:(6 Hours before arrival) 6 horas ante llegada.may eat a light meal of toast and a clear liquid no butter on toast up until 0630  Do not drink clear liquids: (4 Hours before arrival) 4 horas ante llegada.0830  Take these medicines the morning of surgery with A SIP OF WATER:  none   Do not wear jewelry, make-up or nail polish.  Do not wear lotions, powders, or perfumes. Do not wear deodorant.  Do not shave 48 hours prior to surgery.  Do not bring valuables to the hospital.  [facility] is not   responsible for any belongings or valuables brought to the hospital.  Contacts, dentures or bridgework may not be worn into surgery.  Leave suitcase in the car. After surgery it may be brought to your room.  For patients admitted to the hospital, checkout time is 11:00 AM the day of              discharge.      Please read over the following fact sheets that you were given:     Preparing for Surgery

## 2022-06-09 ENCOUNTER — Encounter (HOSPITAL_COMMUNITY): Payer: Self-pay | Admitting: Obstetrics & Gynecology

## 2022-06-09 ENCOUNTER — Encounter (HOSPITAL_COMMUNITY)
Admission: AD | Disposition: A | Discharge status: Home / Self Care | Payer: Self-pay | Source: Home / Self Care | Attending: Obstetrics & Gynecology

## 2022-06-09 ENCOUNTER — Encounter (HOSPITAL_COMMUNITY)
Admission: RE | Admit: 2022-06-09 | Discharge: 2022-06-09 | Disposition: A | Discharge status: Home / Self Care | Payer: 59 | Source: Ambulatory Visit | Attending: Obstetrics & Gynecology | Admitting: Obstetrics & Gynecology

## 2022-06-09 ENCOUNTER — Inpatient Hospital Stay (HOSPITAL_COMMUNITY)
Admission: AD | Admit: 2022-06-09 | Discharge: 2022-06-11 | DRG: 787 | Disposition: A | Discharge status: Home / Self Care | Payer: 59 | Attending: Obstetrics & Gynecology | Admitting: Obstetrics & Gynecology

## 2022-06-09 ENCOUNTER — Inpatient Hospital Stay (HOSPITAL_COMMUNITY): Payer: 59 | Admitting: Anesthesiology

## 2022-06-09 ENCOUNTER — Other Ambulatory Visit: Payer: Self-pay

## 2022-06-09 ENCOUNTER — Inpatient Hospital Stay (HOSPITAL_COMMUNITY): Admission: AD | Admit: 2022-06-09 | Payer: 59 | Source: Home / Self Care | Admitting: Obstetrics & Gynecology

## 2022-06-09 DIAGNOSIS — Z3A38 38 weeks gestation of pregnancy: Secondary | ICD-10-CM | POA: Diagnosis not present

## 2022-06-09 DIAGNOSIS — D62 Acute posthemorrhagic anemia: Secondary | ICD-10-CM | POA: Diagnosis not present

## 2022-06-09 DIAGNOSIS — O26893 Other specified pregnancy related conditions, third trimester: Secondary | ICD-10-CM | POA: Diagnosis present

## 2022-06-09 DIAGNOSIS — O99892 Other specified diseases and conditions complicating childbirth: Secondary | ICD-10-CM | POA: Diagnosis present

## 2022-06-09 DIAGNOSIS — F39 Unspecified mood [affective] disorder: Secondary | ICD-10-CM | POA: Diagnosis present

## 2022-06-09 DIAGNOSIS — O99844 Bariatric surgery status complicating childbirth: Secondary | ICD-10-CM | POA: Diagnosis present

## 2022-06-09 DIAGNOSIS — O9081 Anemia of the puerperium: Secondary | ICD-10-CM | POA: Diagnosis not present

## 2022-06-09 DIAGNOSIS — O99214 Obesity complicating childbirth: Secondary | ICD-10-CM | POA: Diagnosis present

## 2022-06-09 DIAGNOSIS — D509 Iron deficiency anemia, unspecified: Secondary | ICD-10-CM | POA: Diagnosis present

## 2022-06-09 DIAGNOSIS — O34211 Maternal care for low transverse scar from previous cesarean delivery: Secondary | ICD-10-CM | POA: Diagnosis not present

## 2022-06-09 DIAGNOSIS — N736 Female pelvic peritoneal adhesions (postinfective): Secondary | ICD-10-CM | POA: Diagnosis present

## 2022-06-09 DIAGNOSIS — Z87891 Personal history of nicotine dependence: Secondary | ICD-10-CM

## 2022-06-09 DIAGNOSIS — Z302 Encounter for sterilization: Secondary | ICD-10-CM

## 2022-06-09 DIAGNOSIS — Z98891 History of uterine scar from previous surgery: Secondary | ICD-10-CM

## 2022-06-09 DIAGNOSIS — O9902 Anemia complicating childbirth: Secondary | ICD-10-CM | POA: Diagnosis present

## 2022-06-09 LAB — CBC
HCT: 32.7 % — ABNORMAL LOW (ref 36.0–46.0)
HCT: 25 % — ABNORMAL LOW (ref 36.0–46.0)
Hemoglobin: 9.4 g/dL — ABNORMAL LOW (ref 12.0–15.0)
Hemoglobin: 7.2 g/dL — ABNORMAL LOW (ref 12.0–15.0)
MCH: 20.3 pg — ABNORMAL LOW (ref 26.0–34.0)
MCH: 19.9 pg — ABNORMAL LOW (ref 26.0–34.0)
MCHC: 28.7 g/dL — ABNORMAL LOW (ref 30.0–36.0)
MCHC: 28.8 g/dL — ABNORMAL LOW (ref 30.0–36.0)
MCV: 70.8 fL — ABNORMAL LOW (ref 80.0–100.0)
MCV: 69.3 fL — ABNORMAL LOW (ref 80.0–100.0)
Platelets: 291 10*3/uL (ref 150–400)
Platelets: 255 10*3/uL (ref 150–400)
RBC: 4.62 MIL/uL (ref 3.87–5.11)
RBC: 3.61 MIL/uL — ABNORMAL LOW (ref 3.87–5.11)
RDW: 21.2 % — ABNORMAL HIGH (ref 11.5–15.5)
RDW: 20.8 % — ABNORMAL HIGH (ref 11.5–15.5)
WBC: 9.2 10*3/uL (ref 4.0–10.5)
WBC: 11.3 10*3/uL — ABNORMAL HIGH (ref 4.0–10.5)
nRBC: 0 % (ref 0.0–0.2)
nRBC: 0 % (ref 0.0–0.2)

## 2022-06-09 LAB — TYPE AND SCREEN
ABO/RH(D): B POS
Antibody Screen: NEGATIVE

## 2022-06-09 SURGERY — Surgical Case
Anesthesia: Spinal | Laterality: Bilateral

## 2022-06-09 MED ORDER — OXYTOCIN-SODIUM CHLORIDE 30-0.9 UT/500ML-% IV SOLN
INTRAVENOUS | Status: AC
  Filled 2022-06-09: qty 500

## 2022-06-09 MED ORDER — ACETAMINOPHEN 500 MG PO TABS: 1000.0000 mg | ORAL_TABLET | Freq: Once | ORAL | Status: DC

## 2022-06-09 MED ORDER — ACETAMINOPHEN 160 MG/5ML PO SOLN: 1000.0000 mg | Freq: Once | ORAL | Status: DC

## 2022-06-09 MED ORDER — SIMETHICONE 80 MG PO CHEW
80.0000 mg | CHEWABLE_TABLET | Freq: Three times a day (TID) | ORAL | Status: DC
  Administered 2022-06-10 – 2022-06-11 (×4): 80 mg via ORAL
  Filled 2022-06-09 (×4): qty 1

## 2022-06-09 MED ORDER — NALOXONE HCL 4 MG/10ML IJ SOLN: 1.0000 ug/kg/h | INTRAVENOUS | Status: DC | PRN

## 2022-06-09 MED ORDER — MORPHINE SULFATE (PF) 0.5 MG/ML IJ SOLN: INTRAMUSCULAR | Status: DC | PRN

## 2022-06-09 MED ORDER — DIBUCAINE (PERIANAL) 1 % EX OINT: 1.0000 | TOPICAL_OINTMENT | CUTANEOUS | Status: DC | PRN

## 2022-06-09 MED ORDER — PHENYLEPHRINE HCL-NACL 20-0.9 MG/250ML-% IV SOLN
INTRAVENOUS | Status: AC
  Filled 2022-06-09: qty 250

## 2022-06-09 MED ORDER — DEXAMETHASONE SODIUM PHOSPHATE 4 MG/ML IJ SOLN
INTRAMUSCULAR | Status: AC
  Filled 2022-06-09: qty 1

## 2022-06-09 MED ORDER — TETANUS-DIPHTH-ACELL PERTUSSIS 5-2.5-18.5 LF-MCG/0.5 IM SUSY: 0.5000 mL | PREFILLED_SYRINGE | Freq: Once | INTRAMUSCULAR | Status: DC

## 2022-06-09 MED ORDER — SODIUM CHLORIDE 0.9 % IR SOLN
Status: DC | PRN
  Administered 2022-06-09: 1

## 2022-06-09 MED ORDER — FENTANYL CITRATE (PF) 100 MCG/2ML IJ SOLN
25.0000 ug | INTRAMUSCULAR | Status: DC | PRN
  Administered 2022-06-09 (×2): 50 ug via INTRAVENOUS

## 2022-06-09 MED ORDER — DIPHENHYDRAMINE HCL 25 MG PO CAPS: 25.0000 mg | ORAL_CAPSULE | Freq: Four times a day (QID) | ORAL | Status: DC | PRN

## 2022-06-09 MED ORDER — OXYTOCIN-SODIUM CHLORIDE 30-0.9 UT/500ML-% IV SOLN
2.5000 [IU]/h | INTRAVENOUS | Status: AC
  Administered 2022-06-09: 2.5 [IU]/h via INTRAVENOUS

## 2022-06-09 MED ORDER — DIPHENHYDRAMINE HCL 25 MG PO CAPS: 25.0000 mg | ORAL_CAPSULE | ORAL | Status: DC | PRN

## 2022-06-09 MED ORDER — CEFAZOLIN IN SODIUM CHLORIDE 3-0.9 GM/100ML-% IV SOLN
3.0000 g | INTRAVENOUS | Status: AC
  Administered 2022-06-09: 3 g via INTRAVENOUS

## 2022-06-09 MED ORDER — SIMETHICONE 80 MG PO CHEW: 80.0000 mg | CHEWABLE_TABLET | ORAL | Status: DC | PRN

## 2022-06-09 MED ORDER — MORPHINE SULFATE (PF) 0.5 MG/ML IJ SOLN
INTRAMUSCULAR | Status: DC | PRN
  Administered 2022-06-09: 150 ug via INTRATHECAL

## 2022-06-09 MED ORDER — ZOLPIDEM TARTRATE 5 MG PO TABS: 5.0000 mg | ORAL_TABLET | Freq: Every evening | ORAL | Status: DC | PRN

## 2022-06-09 MED ORDER — NALOXONE HCL 0.4 MG/ML IJ SOLN: 0.4000 mg | INTRAMUSCULAR | Status: DC | PRN

## 2022-06-09 MED ORDER — MORPHINE SULFATE (PF) 0.5 MG/ML IJ SOLN
INTRAMUSCULAR | Status: AC
  Filled 2022-06-09: qty 10

## 2022-06-09 MED ORDER — PRENATAL PLUS 27-1 MG PO TABS: 1.0000 | ORAL_TABLET | Freq: Every day | ORAL | Status: DC

## 2022-06-09 MED ORDER — STERILE WATER FOR IRRIGATION IR SOLN
Status: DC | PRN
  Administered 2022-06-09: 1000 mL

## 2022-06-09 MED ORDER — ACETAMINOPHEN 500 MG PO TABS
1000.0000 mg | ORAL_TABLET | Freq: Four times a day (QID) | ORAL | Status: DC
  Administered 2022-06-09 – 2022-06-11 (×7): 1000 mg via ORAL
  Filled 2022-06-09 (×7): qty 2

## 2022-06-09 MED ORDER — POVIDONE-IODINE 10 % EX SWAB: 2.0000 | Freq: Once | CUTANEOUS | Status: DC

## 2022-06-09 MED ORDER — OXYTOCIN-SODIUM CHLORIDE 30-0.9 UT/500ML-% IV SOLN
INTRAVENOUS | Status: AC
  Filled 2022-06-09: qty 1000

## 2022-06-09 MED ORDER — FENTANYL CITRATE (PF) 100 MCG/2ML IJ SOLN
INTRAMUSCULAR | Status: AC
  Filled 2022-06-09: qty 2

## 2022-06-09 MED ORDER — FENTANYL CITRATE (PF) 100 MCG/2ML IJ SOLN: INTRAMUSCULAR | Status: DC | PRN

## 2022-06-09 MED ORDER — PHENYLEPHRINE 80 MCG/ML (10ML) SYRINGE FOR IV PUSH (FOR BLOOD PRESSURE SUPPORT)
PREFILLED_SYRINGE | INTRAVENOUS | Status: AC
  Filled 2022-06-09: qty 10

## 2022-06-09 MED ORDER — COCONUT OIL OIL: 1.0000 | TOPICAL_OIL | Status: DC | PRN

## 2022-06-09 MED ORDER — FENTANYL CITRATE (PF) 100 MCG/2ML IJ SOLN
INTRAMUSCULAR | Status: DC | PRN
  Administered 2022-06-09: 15 ug via INTRATHECAL

## 2022-06-09 MED ORDER — ONDANSETRON HCL 4 MG/2ML IJ SOLN
INTRAMUSCULAR | Status: DC | PRN
  Administered 2022-06-09: 4 mg via INTRAVENOUS

## 2022-06-09 MED ORDER — TRANEXAMIC ACID-NACL 1000-0.7 MG/100ML-% IV SOLN
INTRAVENOUS | Status: DC | PRN
  Administered 2022-06-09: 1000 mg via INTRAVENOUS

## 2022-06-09 MED ORDER — ONDANSETRON HCL 4 MG/2ML IJ SOLN: 4.0000 mg | Freq: Three times a day (TID) | INTRAMUSCULAR | Status: DC | PRN

## 2022-06-09 MED ORDER — OXYTOCIN-SODIUM CHLORIDE 30-0.9 UT/500ML-% IV SOLN
INTRAVENOUS | Status: DC | PRN
  Administered 2022-06-09: 30 [IU] via INTRAVENOUS

## 2022-06-09 MED ORDER — ONDANSETRON HCL 4 MG/2ML IJ SOLN
INTRAMUSCULAR | Status: AC
  Filled 2022-06-09: qty 2

## 2022-06-09 MED ORDER — SODIUM CHLORIDE 0.9% FLUSH: 3.0000 mL | INTRAVENOUS | Status: DC | PRN

## 2022-06-09 MED ORDER — ACETAMINOPHEN 500 MG PO TABS: 1000.0000 mg | ORAL_TABLET | Freq: Four times a day (QID) | ORAL | Status: DC

## 2022-06-09 MED ORDER — KETOROLAC TROMETHAMINE 30 MG/ML IJ SOLN
30.0000 mg | Freq: Four times a day (QID) | INTRAMUSCULAR | Status: AC
  Administered 2022-06-09 – 2022-06-10 (×3): 30 mg via INTRAVENOUS
  Filled 2022-06-09 (×3): qty 1

## 2022-06-09 MED ORDER — BUPIVACAINE IN DEXTROSE 0.75-8.25 % IT SOLN
INTRATHECAL | Status: DC | PRN
  Administered 2022-06-09: 1.6 mL via INTRATHECAL

## 2022-06-09 MED ORDER — DROPERIDOL 2.5 MG/ML IJ SOLN: 0.6250 mg | Freq: Once | INTRAMUSCULAR | Status: DC | PRN

## 2022-06-09 MED ORDER — PRENATAL MULTIVITAMIN CH
1.0000 | ORAL_TABLET | Freq: Every day | ORAL | Status: DC
  Administered 2022-06-10 – 2022-06-11 (×2): 1 via ORAL
  Filled 2022-06-09 (×2): qty 1

## 2022-06-09 MED ORDER — PHENYLEPHRINE HCL-NACL 20-0.9 MG/250ML-% IV SOLN
INTRAVENOUS | Status: DC | PRN
  Administered 2022-06-09: 45 ug/min via INTRAVENOUS

## 2022-06-09 MED ORDER — SENNOSIDES-DOCUSATE SODIUM 8.6-50 MG PO TABS
2.0000 | ORAL_TABLET | Freq: Every day | ORAL | Status: DC
  Administered 2022-06-10 – 2022-06-11 (×2): 2 via ORAL
  Filled 2022-06-09 (×2): qty 2

## 2022-06-09 MED ORDER — ACETAMINOPHEN 10 MG/ML IV SOLN
INTRAVENOUS | Status: DC | PRN
  Administered 2022-06-09: 1000 mg via INTRAVENOUS

## 2022-06-09 MED ORDER — LIDOCAINE-EPINEPHRINE (PF) 2 %-1:200000 IJ SOLN
INTRAMUSCULAR | Status: DC | PRN
  Administered 2022-06-09: 3 mL via EPIDURAL

## 2022-06-09 MED ORDER — CEFAZOLIN IN SODIUM CHLORIDE 3-0.9 GM/100ML-% IV SOLN
INTRAVENOUS | Status: AC
  Filled 2022-06-09: qty 100

## 2022-06-09 MED ORDER — KETOROLAC TROMETHAMINE 30 MG/ML IJ SOLN
INTRAMUSCULAR | Status: AC
  Filled 2022-06-09: qty 1

## 2022-06-09 MED ORDER — DIPHENHYDRAMINE HCL 50 MG/ML IJ SOLN: 12.5000 mg | INTRAMUSCULAR | Status: DC | PRN

## 2022-06-09 MED ORDER — KETOROLAC TROMETHAMINE 30 MG/ML IJ SOLN
30.0000 mg | Freq: Once | INTRAMUSCULAR | Status: AC
  Administered 2022-06-09: 30 mg via INTRAVENOUS

## 2022-06-09 MED ORDER — OXYCODONE HCL 5 MG PO TABS
5.0000 mg | ORAL_TABLET | ORAL | Status: DC | PRN
  Administered 2022-06-10 (×3): 10 mg via ORAL
  Filled 2022-06-09 (×3): qty 2

## 2022-06-09 MED ORDER — KETOROLAC TROMETHAMINE 30 MG/ML IJ SOLN: 30.0000 mg | Freq: Four times a day (QID) | INTRAMUSCULAR | Status: DC | PRN

## 2022-06-09 MED ORDER — FENTANYL CITRATE (PF) 100 MCG/2ML IJ SOLN
INTRAMUSCULAR | Status: DC | PRN
  Administered 2022-06-09: 35 ug via INTRAVENOUS

## 2022-06-09 MED ORDER — WITCH HAZEL-GLYCERIN EX PADS: 1.0000 | MEDICATED_PAD | CUTANEOUS | Status: DC | PRN

## 2022-06-09 MED ORDER — LACTATED RINGERS IV SOLN: INTRAVENOUS | Status: DC

## 2022-06-09 MED ORDER — MENTHOL 3 MG MT LOZG: 1.0000 | LOZENGE | OROMUCOSAL | Status: DC | PRN

## 2022-06-09 MED ORDER — POLYSACCHARIDE IRON COMPLEX 150 MG PO CAPS
150.0000 mg | ORAL_CAPSULE | Freq: Every day | ORAL | Status: DC
  Administered 2022-06-09 – 2022-06-11 (×3): 150 mg via ORAL
  Filled 2022-06-09 (×3): qty 1

## 2022-06-09 SURGICAL SUPPLY — 38 items
APL SKNCLS STERI-STRIP NONHPOA (GAUZE/BANDAGES/DRESSINGS) ×1
BENZOIN TINCTURE PRP APPL 2/3 (GAUZE/BANDAGES/DRESSINGS) IMPLANT
CHLORAPREP W/TINT 26ML (MISCELLANEOUS) ×2 IMPLANT
CLAMP UMBILICAL CORD (MISCELLANEOUS) ×2 IMPLANT
CLOTH BEACON ORANGE TIMEOUT ST (SAFETY) ×1 IMPLANT
DRSG OPSITE POSTOP 4X10 (GAUZE/BANDAGES/DRESSINGS) ×1 IMPLANT
ELECT REM PT RETURN 9FT ADLT (ELECTROSURGICAL) ×1
ELECTRODE REM PT RTRN 9FT ADLT (ELECTROSURGICAL) ×2 IMPLANT
EXTRACTOR VACUUM KIWI (MISCELLANEOUS) IMPLANT
EXTRACTOR VACUUM M CUP 4 TUBE (SUCTIONS) IMPLANT
GLOVE BIO SURGEON STRL SZ7 (GLOVE) ×2 IMPLANT
GLOVE BIOGEL PI IND STRL 7.0 (GLOVE) ×3 IMPLANT
GOWN STRL REUS W/TWL LRG LVL3 (GOWN DISPOSABLE) ×6 IMPLANT
KIT ABG SYR 3ML LUER SLIP (SYRINGE) IMPLANT
MAT PREVALON FULL STRYKER (MISCELLANEOUS) IMPLANT
NDL HYPO 25X5/8 SAFETYGLIDE (NEEDLE) IMPLANT
NEEDLE HYPO 25X5/8 SAFETYGLIDE (NEEDLE) IMPLANT
NS IRRIG 1000ML POUR BTL (IV SOLUTION) ×1 IMPLANT
PACK C SECTION WH (CUSTOM PROCEDURE TRAY) ×1 IMPLANT
PAD ABD DERMACEA PRESS 5X9 (GAUZE/BANDAGES/DRESSINGS) IMPLANT
PAD OB MATERNITY 4.3X12.25 (PERSONAL CARE ITEMS) ×1 IMPLANT
RETRACTOR TRAXI PANNICULUS (MISCELLANEOUS) IMPLANT
RTRCTR C-SECT PINK 25CM LRG (MISCELLANEOUS) IMPLANT
STRIP CLOSURE SKIN 1/2X4 (GAUZE/BANDAGES/DRESSINGS) IMPLANT
SUT MNCRL 0 VIOLET CTX 36 (SUTURE) ×4 IMPLANT
SUT MONOCRYL 0 CTX 36 (SUTURE) ×2
SUT PLAIN 0 NONE (SUTURE) IMPLANT
SUT PLAIN 2 0 (SUTURE) ×1
SUT PLAIN ABS 2-0 CT1 27XMFL (SUTURE) IMPLANT
SUT VIC AB 0 CT1 27 (SUTURE) ×2
SUT VIC AB 0 CT1 27XBRD ANBCTR (SUTURE) ×2 IMPLANT
SUT VIC AB 2-0 CT1 27 (SUTURE) ×1
SUT VIC AB 2-0 CT1 TAPERPNT 27 (SUTURE) ×1 IMPLANT
SUT VIC AB 4-0 KS 27 (SUTURE) ×1 IMPLANT
TOWEL OR 17X24 6PK STRL BLUE (TOWEL DISPOSABLE) ×1 IMPLANT
TRAXI PANNICULUS RETRACTOR (MISCELLANEOUS) ×1
TRAY FOLEY W/BAG SLVR 14FR LF (SET/KITS/TRAYS/PACK) IMPLANT
WATER STERILE IRR 1000ML POUR (IV SOLUTION) ×2 IMPLANT

## 2022-06-09 NOTE — Op Note (Signed)
Cesarean Section Procedure Note   Crystal [name]  06/09/2022 Procedure: Repeat 4th Cesarean section  Indications:  Early labor, 38.6 weeks, H/o C-section x 3, Permanent sterilization desired    Pre-operative Diagnosis: Previous Cesarean Section x3, Desires Sterilization.   Post-operative Diagnosis: Same extensive abdominal adhesions   Surgeon: Azucena Fallen, MD - Primary   Assistants: Gavin Potters CNM   Anesthesia: spinal   Procedure Details:  The patient was seen in the Holding Room. The risks, benefits, complications, treatment options, and expected outcomes were discussed with the patient. The patient concurred with the proposed plan, giving informed consent. identified as Crystal [name] and the procedure verified as C-Section Delivery and permanent sterilization.  A Time Out was held and the above information confirmed. 3 gm Ancef given.  After induction of anesthesia, the patient was draped and prepped in the usual sterile manner, foley was draining urine well.  A pfannenstiel incision was made and carried down through the subcutaneous tissue to the fascia. Fascial incision was made and extended transversely. The fascia was separated from the underlying rectus tissue superiorly and inferiorly after significant blunt and sharp dissection using blade and scissors. The peritoneum was identified and entered. Abdominal wall was entirely scarred to the uterus. With gradual visualization and dissection,  I was able to create wide enough window and utero-vesical peritoneal reflection was incised transversely and the bladder flap was bluntly freed from the lower uterine segment and bladder blade placed. Alexis retractor could not be used. A low transverse uterine incision was made curving up. Copious clear amniotic fluid noted. Delivered from cephalic presentation was a FEMALE infant with vigorous cry. Apgar scores of 9 at one minute and 9 at five minutes. Delayed cord clamping done at 30 seconds and  baby handed to NICU team in attendance, we didn't wait 1 minute due to maternal bleeding.  Uterus outline assessed and Ring clamps placed all around to control active bleeding. Cord ph was not sent. Cord blood was obtained for evaluation. The placenta was removed Intact and appeared normal. The uterine incision was closed with running locked sutures of 0 Monocryl. .A second imbricating layer was not sutured.   Hemostasis was observed. Peritoneal gutters were cleared of clots. Now lateral peritoneal to lateral wall adhesions were released and I was able to see both tubes and ovaries but due to tubal adhesions to ovaries and mesosalpinx, so minimize risk of bleeding, decision was made not to perform tubal ligation. Patient was counseled and couple agreed to proceed with vasectomy route. Peritoneal closure done with 2-0 Vicryl.  The fascia was then reapproximated with running sutures of 0Vicryl. The subcuticular closure was performed using 2-0plain gut. The skin was closed with 4-0Vicryl. Pressure dressing placed.  Instrument, sponge, and needle counts were correct prior the abdominal closure and were correct at the conclusion of the case.   Findings: Extensive abdominal adhesions. TL could not be done. Baby BOY delivered from [facility] hysterotomy.    Estimated Blood Loss: 1050 cc   Total IV Fluids: 1000 ml LR   Urine Output:  200CC OF clear urine  Specimens:  Cord blood   Complications: no complications  Disposition: PACU - hemodynamically stable.   Maternal Condition: stable   Baby condition / location:  Couplet care / Skin to Skin  Attending Attestation: I performed the procedure.   Signed: Surgeon(s): Azucena Fallen, MD

## 2022-06-09 NOTE — Anesthesia Preprocedure Evaluation (Addendum)
Anesthesia Evaluation  Patient identified by MRN, date of birth, ID band Patient awake    Reviewed: Allergy & Precautions, NPO status , Patient's Chart, lab work & pertinent test results  History of Anesthesia Complications Negative for: history of anesthetic complications  Airway Mallampati: II  TM Distance: >3 FB Neck ROM: Full    Dental  (+) Chipped,    Pulmonary former smoker,    Pulmonary exam normal        Cardiovascular negative cardio ROS Normal cardiovascular exam     Neuro/Psych Anxiety Depression negative neurological ROS     GI/Hepatic negative GI ROS, Neg liver ROS,   Endo/Other  Morbid obesity (BMI 46)  Renal/GU negative Renal ROS  negative genitourinary   Musculoskeletal negative musculoskeletal ROS (+)   Abdominal   Peds  Hematology  (+) Blood dyscrasia (Hgb 9.4), anemia ,   Anesthesia Other Findings Day of surgery medications reviewed with patient.  Reproductive/Obstetrics (+) Pregnancy (Hx of C/S x3)                            Anesthesia Physical Anesthesia Plan  ASA: 3  Anesthesia Plan: Combined Spinal and Epidural   Post-op Pain Management:    Induction:   PONV Risk Score and Plan: 4 or greater and Treatment may vary due to age or medical condition, Ondansetron and Dexamethasone  Airway Management Planned: Natural Airway  Additional Equipment: None  Intra-op Plan:   Post-operative Plan:   Informed Consent: I have reviewed the patients History and Physical, chart, labs and discussed the procedure including the risks, benefits and alternatives for the proposed anesthesia with the patient or authorized representative who has indicated his/her understanding and acceptance.       Plan Discussed with: CRNA  Anesthesia Plan Comments:        Anesthesia Quick Evaluation

## 2022-06-09 NOTE — Anesthesia Postprocedure Evaluation (Signed)
Anesthesia Post Note  Patient: Crystal [name]  Procedure(s) Performed: Repeat CESAREAN SECTION WITH BILATERAL TUBAL LIGATION Possibly by Salpingectomy (Bilateral)     Patient location during evaluation: PACU Anesthesia Type: Combined Spinal/Epidural Level of consciousness: awake and alert Pain management: pain level controlled Vital Signs Assessment: post-procedure vital signs reviewed and stable Respiratory status: spontaneous breathing, nonlabored ventilation and respiratory function stable Cardiovascular status: blood pressure returned to baseline Postop Assessment: no apparent nausea or vomiting Anesthetic complications: no   No notable events documented.  Last Vitals:  Vitals:   06/09/22 1600 06/09/22 1625  BP: 108/81 125/66  Pulse: 61 (!) 58  Resp: 15 16  Temp:  36.9 C  SpO2: 100% 100%    Last Pain:  Vitals:   06/09/22 1625  TempSrc: Oral  PainSc: 4    Pain Goal: Patients Stated Pain Goal: 4 (06/09/22 1159)                 Marthenia Rolling

## 2022-06-09 NOTE — H&P (Signed)
Crystal [name] is a 36 y.o. female presenting for emergency RC/s due to early labor. 38.6 wks. UCs since 4 am, now frequent. Office check noted Cervix at  3 cm and BBOW.  No VB/ LOF. Good FMs.   35 yo, MF, [name], unplanned but desired preg. C/s x 3. Want TL w/ this 4th C/s. 3rd kid (1st one w/ current partner) labor started at 38 wks, then PP HTN.  Obesity, excessive weight gain in pregnancy.  H/o Bariatric surg 2019.  Sono NCWD, EDC by Margo Aye 06/17/22. AMA at [facility]. AMA, low risk NIPT H/o GHTN postpartum last pregnancy- baseline CMP and 24hr urine protein wnl. Could not take bb ASA due to bariatric surgery   H/o Shingles. No HSV, no LEEP  Uncomplicated pregnancy but having contractions since 35 weeks Wnly NST from 35 wks for maternal BMI >40  Recurrent yeast infections in preg  Transient anxiety from job, took Buspar briefly  Anemia in preg  32 wks - growth  EFW 5 pounds, 77%, AC 88%, AFI normal at 15.9 cm, placenta normal  OB History     Gravida  8   Para  3   Term  3   Preterm  0   AB  4   Living  3      SAB  4   IAB      Ectopic      Multiple  0   Live Births  3          Past Medical History:  Diagnosis Date   Anemia    Anxiety    Depression    H/O varicella    History of gestational hypertension    2013--- PIH   Hx of chlamydia infection    PCOS (polycystic ovarian syndrome)    No radiological confirmation   S/P gastric bypass 07/10/2017   Wears glasses    Past Surgical History:  Procedure Laterality Date   CESAREAN SECTION  08/18/2012   Procedure: CESAREAN SECTION;x 1  Surgeon: Tresa Endo A. Ernestina Penna, MD;  Location: [facility];  Service: Obstetrics;  Laterality: N/A;  Primary Cesarean Section    CESAREAN SECTION N/A 05/21/2015   Procedure: CESAREAN SECTION;  Surgeon: Tereso Newcomer, MD;  Location: [facility];  Service: Obstetrics;  Laterality: N/A;  Requested 05-21-15 @ 1:15p   CESAREAN SECTION N/A 02/10/2021   Procedure: Repeat CESAREAN SECTION;  Surgeon: Vick Frees, MD;  Location: [facility];  Service: Obstetrics;  Laterality: N/A;  EDD: 02/23/21   LAPAROSCOPIC GASTRIC SLEEVE RESECTION N/A 07/10/2017   Procedure: LAPAROSCOPIC GASTRIC SLEEVE RESECTION WITH UPPER ENDO;  Surgeon: Berna Bue, MD;  Location: [facility] ORS;  Service: General;  Laterality: N/A;   Family History: family history includes Asthma in her father and mother; Cancer in her maternal grandmother; Diabetes in her father, maternal grandmother, and mother; Hypertension in her father, maternal grandmother, and mother; Stroke in an other family member. Social History:  reports that she quit smoking about 8 years ago. Her smoking use included cigarettes. She has never used smokeless tobacco. She reports that she does not currently use alcohol. She reports that she does not use drugs.     Maternal Diabetes: No Genetic Screening: Normal  NIPT low risk  AFP1 neg  Maternal Ultrasounds/Referrals: Normal Fetal Ultrasounds or other Referrals:  None Maternal Substance Abuse:  No Significant Maternal Medications:  None Significant Maternal Lab Results:  Group B Strep negative Number of Prenatal Visits:greater than 3 verified prenatal visits  Other Comments:  None  Review of Systems History   Blood pressure 124/75, temperature 98.4 F (36.9 C), temperature source Oral, resp. rate 16, height 5\' 7"  (1.702 m), weight 132.5 kg, SpO2 100 %, unknown if currently breastfeeding. Exam Physical Exam  Physical exam:  A&O x 3, no acute distress. Pleasant HEENT neg, no thyromegaly Lungs CTA bilat CV RRR, S1S2 normal Abdo soft, non tender, non acute Extr no edema/ tenderness Pelvic Cx 3/ cm/ BBOW FHT  130s  Toco yes   Prenatal labs: ABO, Rh: B+ Antibody: Neg  Rubella: Immune (03/20 0000) RPR: Nonreactive (03/20 0000)  HBsAg: Negative (03/20 0000)  HIV: Non-reactive (03/20 0000)  GBS:   Neg HepC neg Glucola nl NIPT low risk AFP1 neg   Assessment/Plan: 36 yo [id number] at 38.6 wks, in  labor. H/o C/s x 3 and desires permanent sterilization with tubal ligation.  Risks/complications of surgery reviewed incl infection, bleeding, damage to internal organs including bladder, bowels, ureters, blood vessels, other risks from anesthesia, VTE and delayed complications of any surgery, complications in future surgery reviewed. Also discussed neonatal complications incl difficult delivery, laceration, vacuum assistance, TTN etc. Pt understands and agrees, all concerns addressed.   H/o PP GHTN last preg, closely monitor PP BPs  Anemia - assess if IV Iron candidate PP since Gastric sleeve surgery    [name] 06/09/2022, 12:12 PM

## 2022-06-09 NOTE — Progress Notes (Signed)
Epidural catheter removed in pacu per MD Howze. Platelets 291.

## 2022-06-09 NOTE — Anesthesia Procedure Notes (Signed)
Epidural Patient location during procedure: OR Start time: 06/09/2022 1:17 PM End time: 06/09/2022 1:20 PM  Staffing Anesthesiologist: Brennan Bailey, MD Performed: anesthesiologist   Preanesthetic Checklist Completed: patient identified, IV checked, risks and benefits discussed, monitors and equipment checked, pre-op evaluation and timeout performed  Epidural Patient position: sitting Prep: DuraPrep and site prepped and draped Patient monitoring: continuous pulse ox, heart rate and blood pressure Approach: midline Location: L3-L4 Injection technique: LOR air  Needle:  Needle type: Tuohy  Needle gauge: 17 G Needle length: 9 cm Needle insertion depth: 7 cm Catheter type: closed end flexible Catheter size: 19 Gauge Catheter at skin depth: 12 cm  Assessment Sensory level: T4 Events: blood not aspirated, injection not painful, no injection resistance, no paresthesia and negative IV test  Additional Notes Patient identified. Risks, benefits, and alternatives discussed with patient including but not limited to bleeding, infection, nerve damage, paralysis, failed block, headache, blood pressure changes, nausea, vomiting, reactions to medication, itching, and postpartum back pain. Confirmed the patient's most recent platelet count. Confirmed with patient that they are not currently taking any anticoagulation, have any bleeding history, or any family history of bleeding disorders. Patient expressed understanding and wished to proceed. All questions were answered. Sterile technique was used throughout the entire procedure.    Crisp LOR with Tuohy needle on first pass. [place] 25g 173mm spinal needle introduced through Tuohy with clear CSF return prior to injection of intrathecal medication. Spinal needle withdrawn and epidural catheter threaded easily. Negative aspiration of catheter for heme or CSF.

## 2022-06-09 NOTE — Transfer of Care (Signed)
Immediate Anesthesia Transfer of Care Note  Patient: Crystal [name]  Procedure(s) Performed: Repeat CESAREAN SECTION WITH BILATERAL TUBAL LIGATION Possibly by Salpingectomy (Bilateral)  Patient Location: PACU  Anesthesia Type:Spinal and Epidural  Level of Consciousness: awake, alert  and patient cooperative  Airway & Oxygen Therapy: Patient Spontanous Breathing  Post-op Assessment: Report given to RN and Post -op Vital signs reviewed and stable  Post vital signs: Reviewed and stable  Last Vitals:  Vitals Value Taken Time  BP 105/84 06/09/22 1501  Temp 36.7 C 06/09/22 1500  Pulse 58 06/09/22 1507  Resp 21 06/09/22 1507  SpO2 100 % 06/09/22 1507  Vitals shown include unvalidated device data.  Last Pain:  Vitals:   06/09/22 1159  TempSrc:   PainSc: 8       Patients Stated Pain Goal: 4 (86/75/44 9201)  Complications: No notable events documented.

## 2022-06-10 LAB — CBC
HCT: 24.9 % — ABNORMAL LOW (ref 36.0–46.0)
Hemoglobin: 7.3 g/dL — ABNORMAL LOW (ref 12.0–15.0)
MCH: 20.4 pg — ABNORMAL LOW (ref 26.0–34.0)
MCHC: 29.3 g/dL — ABNORMAL LOW (ref 30.0–36.0)
MCV: 69.7 fL — ABNORMAL LOW (ref 80.0–100.0)
Platelets: 238 10*3/uL (ref 150–400)
RBC: 3.57 MIL/uL — ABNORMAL LOW (ref 3.87–5.11)
RDW: 20.6 % — ABNORMAL HIGH (ref 11.5–15.5)
WBC: 15.3 10*3/uL — ABNORMAL HIGH (ref 4.0–10.5)
nRBC: 0 % (ref 0.0–0.2)

## 2022-06-10 LAB — RPR: RPR Ser Ql: NONREACTIVE

## 2022-06-10 MED ORDER — SODIUM CHLORIDE 0.9 % IV SOLN
500.0000 mg | Freq: Once | INTRAVENOUS | Status: AC
  Administered 2022-06-10: 500 mg via INTRAVENOUS
  Filled 2022-06-10: qty 25

## 2022-06-10 MED ORDER — IBUPROFEN 600 MG PO TABS
600.0000 mg | ORAL_TABLET | Freq: Four times a day (QID) | ORAL | Status: DC
  Administered 2022-06-10 – 2022-06-11 (×4): 600 mg via ORAL
  Filled 2022-06-10 (×4): qty 1

## 2022-06-10 MED ORDER — MAGNESIUM OXIDE -MG SUPPLEMENT 400 (240 MG) MG PO TABS
400.0000 mg | ORAL_TABLET | Freq: Every day | ORAL | Status: DC
  Administered 2022-06-10 – 2022-06-11 (×2): 400 mg via ORAL
  Filled 2022-06-10 (×2): qty 1

## 2022-06-10 NOTE — Lactation Note (Signed)
This note was copied from a baby's chart. Lactation Consultation Note  Patient Name: Crystal [name] [id number] Date: 06/10/2022 Reason for consult: Initial assessment;Early term 37-38.6wks Age:35 hours   P4: Early term infant at 38+6 weeks Feeding preference: Breast/formula Weight loss: 7% (less than 24 hours old)  "Crystal [name]" was swaddled and asleep next to birth parent when I arrived.  Offered to help awaken and latch since it had been approximately 3-1/2 hours since his last feeding; birth parent receptive.  Taught hand expression; no drops obtained.  Attempted to latch, however, "Crystal [name]" was too sleepy to open his mouth.  Gentle stimulation demonstrated but he remained sleepy.  Placed him STS on birth parent's chest where he continued to sleep.  Due to weight loss, encouraged to feed at least every three hours or sooner if "Crystal [name]" shows cues.  Suggested birth parent call her RN/LC for latch assistance as needed.    Support person present.   Maternal Data Has patient been taught Hand Expression?: Yes Does the patient have breastfeeding experience prior to this delivery?: No  Feeding Mother's Current Feeding Choice: Breast Milk and Formula  LATCH Score Latch: Too sleepy or reluctant, no latch achieved, no sucking elicited.  Audible Swallowing: None  Type of Nipple: Everted at rest and after stimulation  Comfort (Breast/Nipple): Soft / non-tender  Hold (Positioning): Assistance needed to correctly position infant at breast and maintain latch.  LATCH Score: 5   Lactation Tools Discussed/Used    Interventions Interventions: Breast feeding basics reviewed;Assisted with latch;Skin to skin;Breast massage;Hand express;Breast compression;Support pillows;Position options;Education;LC Services brochure  Discharge Pump: Personal  Consult Status      [name] R Sumit Branham 06/10/2022, 7:35 AM

## 2022-06-10 NOTE — Progress Notes (Signed)
CSW received consult for hx of Anxiety and Depression. CSW met with MOB at bedside to offer support and complete assessment. When CSW entered room, MOB and FOB were sitting on couch. Infant "Crystal [name]" was observed sleeping on his back in bassinet. CSW requested to speak with MOB alone, MOB provided verbal consent to speak in front of FOB about anything. CSW introduced self and explained reason for consult. MOB welcomed CSW consult and remained engaged throughout encounter.   CSW inquired how MOB has felt emotionally since giving birth. MOB reports she feels "fine." CSW inquired about MOB's mental health history. MOB reports she "had a lot going on in the beginning of pregnancy" but denies current symptoms of depression/anxiety, sharing that her mental health improved as her pregnancy progressed. MOB reports that she was diagnosed with depression and anxiety about 10 years ago. MOB reports her symptoms of depression and anxiety are "situational." MOB reports she has been prescribed psychotropic medications in the past but reports she does not take the medication due to symptoms resolving quickly. MOB denies taking mental health medications during pregnancy despite being prescribed buspirone and an antidepressant. MOB reports she attended therapy in 2019 but is not current with a therapist. CSW inquired if MOB experienced PMADs after her prior pregnancies. MOB recalls she experienced the baby blues but denies experiencing symptoms of depression/anxiety beyond 2 weeks postpartum. MOB reports she feels she can cope on her own at this time and declined additional mental health resources. MOB denied current SI/HI. FOB left the room during the last few minutes of consult. CSW assessed for DV; MOB denied DV.  MOB reports she has all needed items for infant, including a car seat and bassinet. MOB has chosen [facility] as infant's pediatrician office. MOB reports she receives WIC and has a phone call scheduled  for Monday, 06/12/22. MOB reports she used to receive food stamps and is interested in reapplying. MOB declined assistance, reporting she has contact information for both WIC and food stamps case workers. MOB denied additional resource needs.   CSW provided education regarding the baby blues period vs. perinatal mood disorders, discussed treatment and gave resources for mental health follow up if concerns arise.  CSW recommends self-evaluation during the postpartum time period using the New Mom Checklist from Postpartum Progress and encouraged MOB to contact a medical professional if symptoms are noted at any time.    CSW declined review of Sudden Infant Death Syndrome (SIDS) precautions, stating she is knowledgeable about safe sleep recommendations.  CSW identifies no further need for intervention and no barriers to discharge at this time.  Signed,  Crystal [name], MSW, LCSWA, LCASA 06/10/2022 10:22 AM 

## 2022-06-10 NOTE — Progress Notes (Signed)
   Subjective: POD# 1 Live born female  Birth Weight: 9 lb 4.2 oz (4200 g) APGAR: 8, 9  Newborn Delivery   Birth date/time: 06/09/2022 14:01:00 Delivery type: C-Section, Low Transverse Trial of labor: No C-section categorization: Repeat     Baby name: Crystal [name]  Delivering provider: MODY, VAISHALI   Circumcision Yes, planning  Feeding: breast  Pain control at delivery: Spinal;Epidural   Reports feeling well, tired but well. Denies dizziness. Family at the bedside.  Patient reports tolerating PO.   Pain controlled with acetaminophen and prescription NSAID's including ketorolac (Toradol) Denies HA/SOB/C/P/N/V/dizziness. She reports vaginal bleeding as normal, without clots. She is ambulating and urinating without difficulty.     Objective:  Vitals:   06/09/22 1930 06/09/22 2030 06/10/22 0030 06/10/22 0430  BP: (!) 109/49 123/66 122/66 133/64  Pulse: 69 68 96 77  Resp: 18 18 18 18   Temp: 98.1 F (36.7 C) 98.7 F (37.1 C) 98.5 F (36.9 C) 98.7 F (37.1 C)  TempSrc: Oral Oral Oral Oral  SpO2: 99% 100% 99% 99%  Weight:      Height:        Intake/Output Summary (Last 24 hours) at 06/10/2022 1236 Last data filed at 06/10/2022 0700 Gross per 24 hour  Intake 500 ml  Output 1825 ml  Net -1325 ml      Recent Labs    06/09/22 1949 06/10/22 0525  WBC 11.3* 15.3*  HGB 7.2* 7.3*  HCT 25.0* 24.9*  PLT 255 238    Blood type: --/--/B POS (09/29 1151)  Rubella: Immune (03/20 0000)   Physical Exam:  General: alert and cooperative CV: Regular rate and rhythm Resp: clear Abdomen: soft, nontender, normal bowel sounds Incision: clean, dry, and intact Uterine Fundus: firm, below umbilicus, nontender Lochia: minimal Ext: edema 1+ non-pitting LLE  Assessment/Plan: 36 y.o.   POD# 1. [id number]                  Principal Problem:   Postpartum care following cesarean delivery 9/29  Encourage rest when baby rests Breastfeeding support Encourage to ambulate Routine post-op  care Active Problems:   Maternal anemia, with delivery - IDA w/ ABL  Hgb this AM 7.3  Asymptomatic  IV iron transfusion today  Start PO iron and mag oxide tomorrow   Mood disorder  S/P social work consult  No barriers to discharge  F/U in the office in 2 weeks for mood check   Morbid obesity ([place])  Encourage frequent ambulation   Cesarean delivery delivered - repeat   PPH 1200cc  Asymptomatic  TXA at delivery  Hgb stable at 7.3  Iron transfusion today   Delivery by emergency cesarean  Anticipate discharge tomorrow.            Crystal [name], CNM, MSN 06/10/2022, 12:36 PM

## 2022-06-11 ENCOUNTER — Encounter (HOSPITAL_COMMUNITY): Payer: Self-pay | Admitting: Obstetrics & Gynecology

## 2022-06-11 MED ORDER — OXYCODONE HCL 5 MG PO TABS: 5.0000 mg | ORAL_TABLET | ORAL | 0 refills | Status: DC | PRN

## 2022-06-11 MED ORDER — IBUPROFEN 600 MG PO TABS: 600.0000 mg | ORAL_TABLET | Freq: Four times a day (QID) | ORAL | 0 refills | Status: DC

## 2022-06-11 MED ORDER — ACETAMINOPHEN 500 MG PO TABS: 1000.0000 mg | ORAL_TABLET | Freq: Four times a day (QID) | ORAL | 0 refills | Status: DC

## 2022-06-11 NOTE — Lactation Note (Signed)
This note was copied from a baby's chart. Lactation Consultation Note  Patient Name: Boy Shereen Marton [id number] Date: 06/11/2022 Reason for consult: Follow-up assessment;Early term 37-38.6wks;1st time breastfeeding Age:35 hours   LC Note:  Family has been discharged.  Baby has a 10% weight loss this morning.  Birth parent has been breast feeding and supplementing with formula.  He has been consuming 20-30 mls every 2-3 hours; multiple voids/stools.  Family has our op phone number for any questions after discharge.  Birth parent is waiting on support person for pick up. RN notified.   Maternal Data    Feeding Nipple Type: Slow - flow  LATCH Score                    Lactation Tools Discussed/Used    Interventions    Discharge Discharge Education: Engorgement and breast care  Consult Status Consult Status: Complete Date: 06/11/22 Follow-up type: Call as needed    Malcom Selmer R Saraann Enneking 06/11/2022, 3:01 PM

## 2022-06-11 NOTE — Plan of Care (Signed)
Discharge instructions given with after visit summary, pt receptive.  

## 2022-06-11 NOTE — Discharge Summary (Signed)
OB Discharge Summary  Patient Name: Crystal [name] DOB: 06-Sep-1986 MRN: [id number]  Date of admission: 06/09/2022 Delivering provider: MODY, VAISHALI   Admitting diagnosis: Delivery by emergency cesarean [O99.892] Previous cesarean section [Z98.891] Intrauterine pregnancy: [redacted]w[redacted]d     Secondary diagnosis: Patient Active Problem List   Diagnosis Date Noted   PPH 1200cc 06/09/2022   Delivery by emergency cesarean 06/09/2022   Cesarean delivery delivered - repeat 02/11/2021   Morbid obesity (HCC) 07/10/2017   Mood disorder 11/19/2016   Postpartum care following cesarean delivery 9/29 08/19/2012   Maternal anemia, with delivery - IDA w/ ABL 08/17/2012    Date of discharge: 06/11/2022   Discharge diagnosis: Principal Problem:   Postpartum care following cesarean delivery 9/29 Active Problems:   Maternal anemia, with delivery - IDA w/ ABL   Mood disorder   Morbid obesity (HCC)   Cesarean delivery delivered - repeat   PPH 1200cc   Delivery by emergency cesarean                                                             Augmentation: N/A Pain control: Spinal;Epidural  Laceration:None  Complications: None  Hospital course:  Onset of Labor With Unplanned C/S   36 y.o. yo [id number] at [redacted]w[redacted]d was admitted in Latent Labor on 06/09/2022. Patient had a labor course significant for previous C/S x 3. The patient went for cesarean section due to Elective Repeat. Delivery details as follows: Membrane Rupture Time/Date: 2:00 PM ,06/09/2022   Delivery Method:C-Section, Low Transverse  Details of operation can be found in separate operative note. She received a IV iron transfusion. Patient had an uncomplicated postpartum course. She is ambulating,tolerating a regular diet, passing flatus, and urinating well.  Patient is discharged home in stable condition 06/11/22.  Newborn Data: Birth date:06/09/2022  Birth time:2:01 PM  Gender:Female  Living status:Living  Apgars:8 ,9  Weight:4200 g   Physical  exam  Vitals:   06/10/22 0430 06/10/22 1455 06/10/22 2039 06/11/22 0527  BP: 133/64 134/68 122/61 107/64  Pulse: 77 100 90 75  Resp: 18 18 18 18   Temp: 98.7 F (37.1 C) 98.5 F (36.9 C) 98.5 F (36.9 C) 98.4 F (36.9 C)  TempSrc: Oral Oral Oral Oral  SpO2: 99% 100% 100%   Weight:      Height:       General: alert, cooperative, and no distress Lochia: appropriate Uterine Fundus: firm Incision: Healing well with no significant drainage DVT Evaluation: No evidence of DVT seen on physical exam.  Labs: Lab Results  Component Value Date   WBC 15.3 (H) 06/10/2022   HGB 7.3 (L) 06/10/2022   HCT 24.9 (L) 06/10/2022   MCV 69.7 (L) 06/10/2022   PLT 238 06/10/2022      06/10/2022    2:58 AM 02/11/2021   10:29 PM  Edinburgh Postnatal Depression Scale Screening Tool  I have been able to laugh and see the funny side of things. 0 0  I have looked forward with enjoyment to things. 1 1  I have blamed myself unnecessarily when things went wrong. 1 1  I have been anxious or worried for no good reason. 2 2  I have felt scared or panicky for no good reason. 1 1  Things have been getting on top of me.  1 0  I have been so unhappy that I have had difficulty sleeping. 0 1  I have felt sad or miserable. 0 0  I have been so unhappy that I have been crying. 0 1  The thought of harming myself has occurred to me. 0 0  Edinburgh Postnatal Depression Scale Total 6 7   Discharge instructions:  per After Visit Summary  After Visit Meds:  Allergies as of 06/11/2022       Reactions   Latex Itching        Medication List     TAKE these medications    acetaminophen 500 MG tablet Commonly known as: TYLENOL Take 2 tablets (1,000 mg total) by mouth every 6 (six) hours.   ibuprofen 600 MG tablet Commonly known as: ADVIL Take 1 tablet (600 mg total) by mouth every 6 (six) hours.   iron polysaccharides 150 MG capsule Commonly known as: NIFEREX Take 1 capsule (150 mg total) by mouth daily.    oxyCODONE 5 MG immediate release tablet Commonly known as: Oxy IR/ROXICODONE Take 1-2 tablets (5-10 mg total) by mouth every 4 (four) hours as needed for moderate pain.   prenatal vitamin w/FE, FA 27-1 MG Tabs tablet Take 1 tablet by mouth daily at 12 noon.       Activity: Advance as tolerated. Pelvic rest for 6 weeks.   Newborn Data: Live born female  Birth Weight: 9 lb 4.2 oz (4200 g) APGAR: 8, 9  Newborn Delivery   Birth date/time: 06/09/2022 14:01:00 Delivery type: C-Section, Low Transverse Trial of labor: No C-section categorization: Repeat     Named [name] Baby Feeding: Bottle and Breast Circumcision: Completed Dr. Ronita Hipps Disposition:home with mother  Delivery Report:  Review the Delivery Report for details.    Follow up:  Follow-up Information     Azucena Fallen, MD. Schedule an appointment as soon as possible for a visit in 2 week(s).   Specialty: Obstetrics and Gynecology Why: For postpartum mood check. Contact information: [place] [id number] [place], CNM, MSN 06/11/2022, 11:52 AM

## 2022-06-16 ENCOUNTER — Ambulatory Visit: Payer: Self-pay

## 2022-06-16 NOTE — Lactation Note (Signed)
This note was copied from a baby's chart. Lactation Consultation Note  Patient Name: Crystal [name] [id number] Date: 06/16/2022   Age:35 days Spoke w/Peds. The baby just ate. They will call for LC to come at next feeding.  Maternal Data    Feeding    LATCH Score                    Lactation Tools Discussed/Used    Interventions    Discharge    Consult Status      Theodoro Kalata 06/16/2022, 7:55 PM

## 2022-06-17 ENCOUNTER — Ambulatory Visit: Payer: Self-pay

## 2022-06-17 NOTE — Lactation Note (Signed)
This note was copied from a baby's chart. Lactation Consultation Note  Patient Name: Crystal [name] Today's Date: 06/17/2022 Reason for consult: Initial assessment;Early term 37-38.6wks Age:35 days  Peds called LC to come and see mom at this time. Mom had put baby to the breast for 5 minutes on each breast. Mom stated that after 5 minutes of BF the baby falls asleep. She the burps him he wakes up and then she put him on the opposite breast and he fell asleep again after 5 minutes. Asked mom if baby coughs during feeding on the breast mom stated no. Mom stated he just uses her as pacifier and that he probably didn't get anything. Asked mom if she pumped any at home mom stated yes. Asked mom how much did she get mom stated 2 oz. Praised mom. Asked mom when was the last time she pumped mom stated at home today earlier. Asked mom if breast were getting full, mom stated no. [place] asked if I could assess breast to see if they were full, mom agreed. Breast were soft. It has been 12 hrs since last pumped. No pain or tenderness to breast. LC offered to set up DEBP mom politely declined. Mom stated she is hoping she is going home in the morning. Baby finishing up drinking 59 ml formula right as LC came into rm.  Suggest to mom: BF first keep baby at least 15 or more minutes on the same breast. Then supplement w/formula Suggest mom pumps when she gets home. Call make appt. For OP LC Maternal Data    Feeding Mother's Current Feeding Choice: Breast Milk and Formula Nipple Type: Slow - flow  LATCH Score          Comfort (Breast/Nipple): Soft / non-tender         Lactation Tools Discussed/Used    Interventions    Discharge    Consult Status Consult Status: Follow-up Date: 06/18/22 Follow-up type: In-patient    [name], [place]ta [place] 06/17/2022, 1:13 AM

## 2022-06-18 ENCOUNTER — Ambulatory Visit: Payer: Self-pay

## 2022-06-18 NOTE — Lactation Note (Addendum)
This note was copied from a baby's chart. Lactation Consultation Note  Patient Name: Crystal [name] Today's Date: 06/18/2022 Reason for consult: Infant weight loss (LC called and Spoke with the Pedis RN caring for the baby today. per RN mom is latching the baby for short periods and majority of the feeding is from the bottle due to weight loss. Baby isn't going home today due to more weight loss.). Mom is not pumping.  Age:35 days LC asked the Pedis RN to see if the mom desired to see the [facility] today. The Pedis RN  Earley Brooke will communicate via the progress note.  Maternal Data    Feeding Mother's Current Feeding Choice: Breast Milk and Formula Nipple Type: Slow - flow   Discharge    Consult Status Consult Status: Follow-up Date: 06/18/22 Follow-up type: In-patient    [place] 06/18/2022, 8:16 AM

## 2022-06-18 NOTE — Lactation Note (Signed)
This note was copied from a baby's chart. Lactation Consultation Note  Patient Name: Crystal [name] Today's Date: 06/18/2022 Reason for consult:  [facility] noted the progress note from [facility] that mom denied the need for lactation today.) Age:35 days  Maternal Data    Feeding Mother's Current Feeding Choice: Breast Milk and Formula Nipple Type: Slow - flow     Consult Status Consult Status: Complete Date: 06/18/22 Follow-up type: In-patient    [place] 06/18/2022, 11:24 AM

## 2022-06-19 ENCOUNTER — Telehealth (HOSPITAL_COMMUNITY): Payer: Self-pay

## 2022-06-19 NOTE — Telephone Encounter (Signed)
Patient states that her newborn is admitted at [facility] and that she is there with him currently. Patient states we can call her back tomorrow. Will attempt call at a later time.  Sharyn Lull [facility]  06/19/22,1845

## 2022-06-20 ENCOUNTER — Telehealth (HOSPITAL_COMMUNITY): Payer: Self-pay

## 2022-06-20 NOTE — Telephone Encounter (Signed)
Patient reports doing ok. "My dressing came off." RN reviewed incision care. "I'm going to call my doctor and schedule an incision check up." Patient declines questions/concerns about her health and healing.  Patient reports that baby is doing well. "We are getting to go home today." Patient voiced that she is happy baby is getting to go home from hospital and they will be able to get settled in at home. Patient states baby has a bassinet or crib for sleep. RN reviewed ABC's of safe sleep with patient.   EPDS score is 0.  Sharyn Lull [facility]  06/20/22,1410

## 2022-09-13 ENCOUNTER — Encounter: Payer: Self-pay | Admitting: Family Medicine

## 2022-09-13 ENCOUNTER — Ambulatory Visit (INDEPENDENT_AMBULATORY_CARE_PROVIDER_SITE_OTHER): Payer: 59 | Admitting: Family Medicine

## 2022-09-13 VITALS — BP 122/77 | HR 73 | Ht 67.0 in | Wt 259.0 lb

## 2022-09-13 DIAGNOSIS — Z0000 Encounter for general adult medical examination without abnormal findings: Secondary | ICD-10-CM

## 2022-09-13 DIAGNOSIS — F39 Unspecified mood [affective] disorder: Secondary | ICD-10-CM | POA: Diagnosis not present

## 2022-09-13 DIAGNOSIS — O9902 Anemia complicating childbirth: Secondary | ICD-10-CM

## 2022-09-13 DIAGNOSIS — R519 Headache, unspecified: Secondary | ICD-10-CM | POA: Diagnosis not present

## 2022-09-13 MED ORDER — ESCITALOPRAM OXALATE 10 MG PO TABS: 10.0000 mg | ORAL_TABLET | Freq: Every day | ORAL | 0 refills | Status: DC

## 2022-09-13 NOTE — Progress Notes (Signed)
Subjective:  Patient ID: Crystal [name], female    DOB: 1986-05-11, 37 y.o.   MRN: [id number]  CC: New Patient  HPI:  Crystal [name] is a very pleasant 37 y.o. female who presents today to re-establish care.  Headaches - Had during pregnancy. Tylenol helped. Frontal spread to back. Laying down helps. Seems like its getting worse. No pre-eclampsia during pregnancy. Tylenol not helping as much now. Sometimes get nausea but not associated with headaches. Denies floaters. Some stress, from work and taking of care children. Same all day not worse at night. Everyday. States she is only getting 3-4hrs a day.  Denies numbness, weakness, blurry vision, facial drooping.  No nausea or vomiting.  Reports family history of multiple sclerosis.  Is not worse at night.  No bladder or bowel issues.  Has been on anxiety and depression medication while pregnant, but not while not pregnant. Feels concentration is decreased not sad. Some anxiety. Does not have strong support system at home to help.  PHQ-9 score 14. Negative question 9.  Denies periods of elation, no sleep, and less energy, distractibility, irritability, grandiosity.  Denies having ever been diagnosed with bipolar disorder however does run in the family.  Denies suicidality, or harm to others.  Has never had a plan or felt suicidal.  PMHx: Past Medical History:  Diagnosis Date   Anemia    Anxiety    Depression    H/O varicella    History of gestational hypertension    2013--- PIH   Hx of chlamydia infection    PCOS (polycystic ovarian syndrome)    No radiological confirmation   S/P gastric bypass 07/10/2017   Wears glasses     Surgical Hx: Past Surgical History:  Procedure Laterality Date   CESAREAN SECTION  08/18/2012   Procedure: CESAREAN SECTION;x 1  Surgeon: Claiborne Billings A. Pamala Hurry, MD;  Location: [facility];  Service: Obstetrics;  Laterality: N/A;  Primary Cesarean Section    CESAREAN SECTION N/A 05/21/2015   Procedure: CESAREAN SECTION;   Surgeon: Osborne Oman, MD;  Location: [facility];  Service: Obstetrics;  Laterality: N/A;  Requested 05-21-15 @ 1:15p   CESAREAN SECTION N/A 02/10/2021   Procedure: Repeat CESAREAN SECTION;  Surgeon: Charyl Bigger, MD;  Location: [facility];  Service: Obstetrics;  Laterality: N/A;  EDD: 02/23/21   CESAREAN SECTION WITH BILATERAL TUBAL LIGATION Bilateral 06/09/2022   Procedure: Repeat CESAREAN SECTION WITH BILATERAL TUBAL LIGATION Possibly by Salpingectomy;  Surgeon: Azucena Fallen, MD;  Location: [facility];  Service: Obstetrics;  Laterality: Bilateral;  EDD: 06/17/22   LAPAROSCOPIC GASTRIC SLEEVE RESECTION N/A 07/10/2017   Procedure: LAPAROSCOPIC GASTRIC SLEEVE RESECTION WITH UPPER ENDO;  Surgeon: [name], MD;  Location: [facility];  Service: General;  Laterality: N/A;    Family Hx: Family History  Problem Relation Age of Onset   Hypertension Mother    Diabetes Mother    Asthma Mother    Hypertension Father    Diabetes Father    Asthma Father    Diabetes Maternal Grandmother    Hypertension Maternal Grandmother    Cancer Maternal Grandmother        urethral, lung   Stroke Other     Social Hx: Current Social History   Who lives at home: (permanent address) stays with sister, sometimes stays with kids dad 09/13/2022  Who would speak for you about health care matters: Crystal [name] 09/13/2022  Transportation: Car 09/13/2022 Important Relationships & Pets: 2 dogs, together  with father of children "sometimes" 09/13/2022  Current Stressors: work 09/13/2022 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  ROS: pertinent noted in the HPI    Objective:  BP 122/77   Pulse 73   Ht 5\' 7"  (1.702 m)   Wt 259 lb (117.5 kg)   LMP 08/15/2022   SpO2 100%   Breastfeeding No   BMI 40.57 kg/m  Vitals and nursing note  reviewed  General: NAD, pleasant, able to participate in exam HEENT: normocephalic, no scleral icterus or conjunctival pallor, no nasal discharge, moist mucous membranes, good dentition without erythema or discharge noted in posterior oropharynx Neck: supple, non-tender, without lymphadenopathy Cardiac: RRR, S1 S2 present. normal heart sounds, no murmurs. Respiratory: CTAB, normal effort, No wheezes, rales or rhonchi Abdomen: non-tender, non-distended Extremities: no edema or cyanosis. Skin: warm and dry, no rashes noted Neuro: alert, no obvious focal deficits Psych: Normal affect and mood  Assessment & Plan:  Sleep-related headache Clinical context of chronic daily headache in the setting of increased stress, lack of sleep and reassuring neurologic exam, makes chronic stress/sleep headache most likely diagnosis.  Patient denies red flag symptoms.  History is not consistent with migraines, medication overuse, or intracranial pathology.  Discussed with patient trialing either SSRI for general anxiety and stress improvement versus nortriptyline for headache prophylaxis versus melatonin.  Patient elected to trial SSRI as above.  Mood disorder PHQ-9 score of 14, negative questions 9.  Scores related to difficulty sleeping.  No indication of depression diagnosis, however does have stress-induced anxiety due to taking care of multiple children 2 of which are infants as well as working full-time job without appropriate support system.  Discussed goal to reduce stress and hopefully improve sleep as regimented her sleep schedule is likely not achievable at this time due to her infant care. -Start Lexapro 10 mg daily -Discussed that it takes 1 to 2 months to work. -Follow-up 1 month  Maternal anemia, with delivery - IDA w/ ABL Hemoglobin at discharge from hospital after cesarean section 7.3.  Patient asymptomatic.  Will recheck hemoglobin for improvement. -CBC  Encounter for medical examination to  establish care Patient does not wish to get influenza vaccine at this time.  States she will get her baby vaccinated when he gets to 6 months.  States they did a Pap smear during her pregnancy I cannot find record of this in her chart.  Will reach out to request records at patient's next follow-up appointment.   Orders Placed This Encounter  Procedures   CBC   Meds ordered this encounter  Medications   escitalopram (LEXAPRO) 10 MG tablet    Sig: Take 1 tablet (10 mg total) by mouth daily.    Dispense:  60 tablet    Refill:  0   Return in about 1 month (around 10/14/2022). Salvadore Oxford, MD 09/13/2022, 4:28 PM PGY-1, [facility]

## 2022-09-13 NOTE — Patient Instructions (Addendum)
It was great to see you! Thank you for allowing me to participate in your care!  I recommend that you always bring your medications to each appointment as this makes it easy to ensure we are on the correct medications and helps [place] not miss when refills are needed.  Our plans for today:  -Please follow-up in 1 month or earlier to reassess how your headaches are doing. -I sent a new medication list below to your pharmacy.  We are checking some labs today, I will call you if they are abnormal will send you a MyChart message or a letter if they are normal.  If you do not hear about your labs in the next 2 weeks please let us know.  Take care and seek immediate care sooner if you develop any concerns.   Dr. Salvadore Oxford, MD [place]

## 2022-09-13 NOTE — Assessment & Plan Note (Signed)
Hemoglobin at discharge from hospital after cesarean section 7.3.  Patient asymptomatic.  Will recheck hemoglobin for improvement. -CBC

## 2022-09-13 NOTE — Assessment & Plan Note (Signed)
PHQ-9 score of 14, negative questions 9.  Scores related to difficulty sleeping.  No indication of depression diagnosis, however does have stress-induced anxiety due to taking care of multiple children 2 of which are infants as well as working full-time job without appropriate support system.  Discussed goal to reduce stress and hopefully improve sleep as regimented her sleep schedule is likely not achievable at this time due to her infant care. -Start Lexapro 10 mg daily -Discussed that it takes 1 to 2 months to work. -Follow-up 1 month

## 2022-09-13 NOTE — Assessment & Plan Note (Signed)
Clinical context of chronic daily headache in the setting of increased stress, lack of sleep and reassuring neurologic exam, makes chronic stress/sleep headache most likely diagnosis.  Patient denies red flag symptoms.  History is not consistent with migraines, medication overuse, or intracranial pathology.  Discussed with patient trialing either SSRI for general anxiety and stress improvement versus nortriptyline for headache prophylaxis versus melatonin.  Patient elected to trial SSRI as above.

## 2022-09-13 NOTE — Assessment & Plan Note (Signed)
Patient does not wish to get influenza vaccine at this time.  States she will get her baby vaccinated when he gets to 6 months.  States they did a Pap smear during her pregnancy I cannot find record of this in her chart.  Will reach out to request records at patient's next follow-up appointment.

## 2022-09-14 LAB — CBC
Hematocrit: 37.1 % (ref 34.0–46.6)
Hemoglobin: 11 g/dL — ABNORMAL LOW (ref 11.1–15.9)
MCH: 22.2 pg — ABNORMAL LOW (ref 26.6–33.0)
MCHC: 29.6 g/dL — ABNORMAL LOW (ref 31.5–35.7)
MCV: 75 fL — ABNORMAL LOW (ref 79–97)
Platelets: 364 10*3/uL (ref 150–450)
RBC: 4.95 x10E6/uL (ref 3.77–5.28)
RDW: 17.1 % — ABNORMAL HIGH (ref 11.7–15.4)
WBC: 6.8 10*3/uL (ref 3.4–10.8)

## 2022-10-23 NOTE — Progress Notes (Deleted)
    SUBJECTIVE:   CHIEF COMPLAINT / HPI:   Sleep-related headaches -   Lexapro -  PAP records  PERTINENT  PMH / PSH: ***  OBJECTIVE:   There were no vitals taken for this visit.  ***  ASSESSMENT/PLAN:   There are no diagnoses linked to this encounter. No follow-ups on file.  Salvadore Oxford, MD [place]

## 2022-10-24 ENCOUNTER — Ambulatory Visit: Payer: 59 | Admitting: Family Medicine

## 2023-02-02 ENCOUNTER — Encounter (HOSPITAL_COMMUNITY): Payer: Self-pay | Admitting: *Deleted

## 2023-02-13 ENCOUNTER — Ambulatory Visit
Admission: EM | Admit: 2023-02-13 | Discharge: 2023-02-13 | Disposition: A | Discharge status: Home / Self Care | Payer: 59 | Attending: Family Medicine | Admitting: Family Medicine

## 2023-02-13 DIAGNOSIS — Z87891 Personal history of nicotine dependence: Secondary | ICD-10-CM | POA: Diagnosis not present

## 2023-02-13 DIAGNOSIS — Z1152 Encounter for screening for COVID-19: Secondary | ICD-10-CM | POA: Insufficient documentation

## 2023-02-13 DIAGNOSIS — R059 Cough, unspecified: Secondary | ICD-10-CM | POA: Diagnosis present

## 2023-02-13 DIAGNOSIS — J069 Acute upper respiratory infection, unspecified: Secondary | ICD-10-CM | POA: Diagnosis not present

## 2023-02-13 DIAGNOSIS — B9789 Other viral agents as the cause of diseases classified elsewhere: Secondary | ICD-10-CM | POA: Diagnosis not present

## 2023-02-13 DIAGNOSIS — R519 Headache, unspecified: Secondary | ICD-10-CM | POA: Diagnosis present

## 2023-02-13 DIAGNOSIS — R070 Pain in throat: Secondary | ICD-10-CM | POA: Insufficient documentation

## 2023-02-13 LAB — POCT RAPID STREP A (OFFICE): Rapid Strep A Screen: NEGATIVE

## 2023-02-13 MED ORDER — BENZONATATE 100 MG PO CAPS: 100.0000 mg | ORAL_CAPSULE | Freq: Three times a day (TID) | ORAL | 0 refills | Status: DC | PRN

## 2023-02-13 MED ORDER — IBUPROFEN 800 MG PO TABS: 800.0000 mg | ORAL_TABLET | Freq: Three times a day (TID) | ORAL | 0 refills | Status: DC | PRN

## 2023-02-13 NOTE — ED Provider Notes (Signed)
[facility]    CSN: [id number] Arrival date & time: 02/13/23  1736      History   Chief Complaint Chief Complaint  Patient presents with   Sore Throat   Cough   Headache    HPI Crystal [name] is a 37 y.o. female.    Sore Throat Associated symptoms include headaches.  Cough Associated symptoms: headaches   Headache Associated symptoms: cough    Here for cough, sore throat, nasal congestion and hoarseness. Symptoms began yesterday. She has also had some h/a. No f/c/n/v/d.  A niece has had strep throat.    Past Medical History:  Diagnosis Date   Anemia    Anxiety    Depression    H/O varicella    History of gestational hypertension    2013--- PIH   Hx of chlamydia infection    PCOS (polycystic ovarian syndrome)    No radiological confirmation   S/P gastric bypass 07/10/2017   Wears glasses     Patient Active Problem List   Diagnosis Date Noted   Encounter for medical examination to establish care 09/13/2022   Sleep-related headache 09/13/2022   PPH 1200cc 06/09/2022   Delivery by emergency cesarean 06/09/2022   Cesarean delivery delivered - repeat 02/11/2021   Morbid obesity (HCC) 07/10/2017   Mood disorder 11/19/2016   Postpartum care following cesarean delivery 9/29 08/19/2012   Maternal anemia, with delivery - IDA w/ ABL 08/17/2012    Past Surgical History:  Procedure Laterality Date   CESAREAN SECTION  08/18/2012   Procedure: CESAREAN SECTION;x 1  Surgeon: Tresa Endo A. Ernestina Penna, MD;  Location: [facility];  Service: Obstetrics;  Laterality: N/A;  Primary Cesarean Section    CESAREAN SECTION N/A 05/21/2015   Procedure: CESAREAN SECTION;  Surgeon: Tereso Newcomer, MD;  Location: [facility];  Service: Obstetrics;  Laterality: N/A;  Requested 05-21-15 @ 1:15p   CESAREAN SECTION N/A 02/10/2021   Procedure: Repeat CESAREAN SECTION;  Surgeon: Vick Frees, MD;  Location: [facility];  Service: Obstetrics;  Laterality: N/A;  EDD: 02/23/21   CESAREAN SECTION  WITH BILATERAL TUBAL LIGATION Bilateral 06/09/2022   Procedure: Repeat CESAREAN SECTION WITH BILATERAL TUBAL LIGATION Possibly by Salpingectomy;  Surgeon: Shea Evans, MD;  Location: [facility];  Service: Obstetrics;  Laterality: Bilateral;  EDD: 06/17/22   LAPAROSCOPIC GASTRIC SLEEVE RESECTION N/A 07/10/2017   Procedure: LAPAROSCOPIC GASTRIC SLEEVE RESECTION WITH UPPER ENDO;  Surgeon: Berna Bue, MD;  Location: [facility];  Service: General;  Laterality: N/A;    OB History     Gravida  8   Para  4   Term  4   Preterm  0   AB  4   Living  4      SAB  4   IAB      Ectopic      Multiple  0   Live Births  4            Home Medications    Prior to Admission medications   Medication Sig Start Date End Date Taking? Authorizing Provider  benzonatate (TESSALON) 100 MG capsule Take 1 capsule (100 mg total) by mouth 3 (three) times daily as needed for cough. 02/13/23  Yes Zenia Resides, MD  ibuprofen (ADVIL) 800 MG tablet Take 1 tablet (800 mg total) by mouth every 8 (eight) hours as needed (pain). 02/13/23  Yes Zenia Resides, MD    Family History Family History  Problem Relation Age of  Onset   Hypertension Mother    Diabetes Mother    Asthma Mother    Hypertension Father    Diabetes Father    Asthma Father    Diabetes Maternal Grandmother    Hypertension Maternal Grandmother    Cancer Maternal Grandmother        urethral, lung   Stroke Other     Social History Social History   Tobacco Use   Smoking status: Former    Types: Cigarettes    Quit date: 09/16/2013    Years since quitting: 9.4   Smokeless tobacco: Never   Tobacco comments:    occasional/ social smoker  Vaping Use   Vaping Use: Never used  Substance Use Topics   Alcohol use: Not Currently    Comment: occasionally   Drug use: No     Allergies   Latex   Review of Systems Review of Systems  Respiratory:  Positive for cough.   Neurological:  Positive for headaches.      Physical Exam Triage Vital Signs ED Triage Vitals  Enc Vitals Group     BP 02/13/23 1742 124/83     Pulse Rate 02/13/23 1742 74     Resp 02/13/23 1742 18     Temp 02/13/23 1742 97.9 F (36.6 C)     Temp Source 02/13/23 1742 Oral     SpO2 02/13/23 1742 99 %     Weight 02/13/23 1741 250 lb (113.4 kg)     Height --      Head Circumference --      Peak Flow --      Pain Score 02/13/23 1741 7     Pain Loc --      Pain Edu? --      Excl. in GC? --    No data found.  Updated Vital Signs BP 124/83 (BP Location: Right Arm)   Pulse 74   Temp 97.9 F (36.6 C) (Oral)   Resp 18   Wt 113.4 kg   SpO2 99%   BMI 39.16 kg/m   Visual Acuity Right Eye Distance:   Left Eye Distance:   Bilateral Distance:    Right Eye Near:   Left Eye Near:    Bilateral Near:     Physical Exam Vitals reviewed.  Constitutional:      General: She is not in acute distress.    Appearance: She is not toxic-appearing.  HENT:     Right Ear: Tympanic membrane and ear canal normal.     Left Ear: Tympanic membrane and ear canal normal.     Nose: Nose normal.     Mouth/Throat:     Mouth: Mucous membranes are moist.     Comments: There is some very mild erythema of the posterior OP. No asymmetry  Eyes:     Extraocular Movements: Extraocular movements intact.     Conjunctiva/sclera: Conjunctivae normal.     Pupils: Pupils are equal, round, and reactive to light.  Cardiovascular:     Rate and Rhythm: Normal rate and regular rhythm.     Heart sounds: No murmur heard. Pulmonary:     Effort: Pulmonary effort is normal. No respiratory distress.     Breath sounds: No stridor. No wheezing, rhonchi or rales.  Musculoskeletal:     Cervical back: Neck supple.  Lymphadenopathy:     Cervical: No cervical adenopathy.  Skin:    Capillary Refill: Capillary refill takes less than 2 seconds.     Coloration: Skin is  not jaundiced or pale.  Neurological:     General: No focal deficit present.     Mental  Status: She is alert and oriented to person, place, and time.  Psychiatric:        Behavior: Behavior normal.      UC Treatments / Results  Labs (all labs ordered are listed, but only abnormal results are displayed) Labs Reviewed  CULTURE, GROUP A STREP ([facility])  SARS CORONAVIRUS 2 (TAT 6-24 HRS)  POCT RAPID STREP A (OFFICE)    EKG   Radiology No results found.  Procedures Procedures (including critical care time)  Medications Ordered in UC Medications - No data to display  Initial Impression / Assessment and Plan / UC Course  I have reviewed the triage vital signs and the nursing notes.  Pertinent labs & imaging results that were available during my care of the patient were reviewed by me and considered in my medical decision making (see chart for details).        Rapid strep is negative; culture is sent, and we will notify and tx per protocol if positive.  Covid swab is done; she does not have risk factors for severe COVID  Tessalon perles are sent for the cough, and Ibuprofen for the pain. Final Clinical Impressions(s) / UC Diagnoses   Final diagnoses:  Viral URI  Throat pain     Discharge Instructions      Your strep test is negative.  Culture of the throat will be sent, and staff will notify you if that is in turn positive.  Take benzonatate 100 mg, 1 tab every 8 hours as needed for cough. Take ibuprofen 800 mg--1 tab every 8 hours as needed for pain.  Mucinex D can also help with congestion   You have been swabbed for COVID, and the test will result in the next 24 hours. Our staff will call you if positive. If the COVID test is positive, you should quarantine until you are fever free for 24 hours and you are starting to feel better, and then take added precautions for the next 5 days, such as physical distancing/wearing a mask and good hand hygiene/washing.      ED Prescriptions     Medication Sig Dispense Auth. Provider   benzonatate  (TESSALON) 100 MG capsule Take 1 capsule (100 mg total) by mouth 3 (three) times daily as needed for cough. 21 capsule Zenia Resides, MD   ibuprofen (ADVIL) 800 MG tablet Take 1 tablet (800 mg total) by mouth every 8 (eight) hours as needed (pain). 21 tablet Yalissa Fink, Janace Aris, MD      PDMP not reviewed this encounter.   Zenia Resides, MD 02/13/23 [phone]

## 2023-02-13 NOTE — Discharge Instructions (Signed)
Your strep test is negative.  Culture of the throat will be sent, and staff will notify you if that is in turn positive.  Take benzonatate 100 mg, 1 tab every 8 hours as needed for cough. Take ibuprofen 800 mg--1 tab every 8 hours as needed for pain.  Mucinex D can also help with congestion   You have been swabbed for COVID, and the test will result in the next 24 hours. Our staff will call you if positive. If the COVID test is positive, you should quarantine until you are fever free for 24 hours and you are starting to feel better, and then take added precautions for the next 5 days, such as physical distancing/wearing a mask and good hand hygiene/washing.

## 2023-02-13 NOTE — ED Triage Notes (Signed)
Sx started yesterday. Sore throat, headache, cough. No fever

## 2023-02-14 LAB — SARS CORONAVIRUS 2 (TAT 6-24 HRS): SARS Coronavirus 2: NEGATIVE

## 2023-02-16 LAB — CULTURE, GROUP A STREP (THRC)

## 2023-07-20 ENCOUNTER — Ambulatory Visit
Admission: EM | Admit: 2023-07-20 | Discharge: 2023-07-20 | Disposition: A | Discharge status: Home / Self Care | Payer: 59 | Attending: Internal Medicine | Admitting: Internal Medicine

## 2023-07-20 DIAGNOSIS — Z87891 Personal history of nicotine dependence: Secondary | ICD-10-CM | POA: Diagnosis not present

## 2023-07-20 DIAGNOSIS — Z3201 Encounter for pregnancy test, result positive: Secondary | ICD-10-CM | POA: Insufficient documentation

## 2023-07-20 DIAGNOSIS — R112 Nausea with vomiting, unspecified: Secondary | ICD-10-CM | POA: Diagnosis not present

## 2023-07-20 DIAGNOSIS — R197 Diarrhea, unspecified: Secondary | ICD-10-CM | POA: Insufficient documentation

## 2023-07-20 LAB — POCT URINALYSIS DIP (MANUAL ENTRY)
Bilirubin, UA: NEGATIVE
Blood, UA: NEGATIVE
Glucose, UA: NEGATIVE mg/dL
Ketones, POC UA: NEGATIVE mg/dL
Nitrite, UA: NEGATIVE
Protein Ur, POC: NEGATIVE mg/dL
Spec Grav, UA: 1.025 (ref 1.010–1.025)
Urobilinogen, UA: 0.2 U/dL
pH, UA: 7 (ref 5.0–8.0)

## 2023-07-20 LAB — POCT URINE PREGNANCY: Preg Test, Ur: POSITIVE — AB

## 2023-07-20 MED ORDER — DOXYLAMINE-PYRIDOXINE 10-10 MG PO TBEC: 2.0000 | DELAYED_RELEASE_TABLET | Freq: Every evening | ORAL | 0 refills | Status: DC | PRN

## 2023-07-20 NOTE — ED Provider Notes (Signed)
Crystal [name]    CSN: [id number] Arrival date & time: 07/20/23  1449      History   Chief Complaint Chief Complaint  Patient presents with   Chills   Diarrhea   Emesis    HPI GERARDETTE [name] is a 37 y.o. female.   Patient presents with nausea, vomiting, diarrhea.  Reports that she has been having nausea for about 2 weeks with vomiting and diarrhea that started last night.  She has also had chills but denies fever.  Denies any known sick contacts, recent unfavorable foods, travel outside the [place].  Denies any associated upper respiratory symptoms, sore throat, cough.  Last menstrual cycle was 06/09/2023 but patient reports that she sometimes has irregular menstrual cycles.  Patient is urinating appropriately and denies any dysuria, urinary frequency, hematuria.  She has been having difficulty keeping food and fluids down.   Diarrhea Emesis   Past Medical History:  Diagnosis Date   Anemia    Anxiety    Depression    H/O varicella    History of gestational hypertension    2013--- PIH   Hx of chlamydia infection    PCOS (polycystic ovarian syndrome)    No radiological confirmation   S/P gastric bypass 07/10/2017   Wears glasses     Patient Active Problem List   Diagnosis Date Noted   Encounter for medical examination to establish [name] 09/13/2022   Sleep-related headache 09/13/2022   PPH 1200cc 06/09/2022   Delivery by emergency cesarean 06/09/2022   Cesarean delivery delivered - repeat 02/11/2021   Morbid obesity (HCC) 07/10/2017   Mood disorder 11/19/2016   Postpartum [name] following cesarean delivery 9/29 08/19/2012   Maternal anemia, with delivery - IDA w/ ABL 08/17/2012    Past Surgical History:  Procedure Laterality Date   CESAREAN SECTION  08/18/2012   Procedure: CESAREAN SECTION;x 1  Surgeon: Tresa Endo A. Ernestina Penna, MD;  Location: [facility];  Service: Obstetrics;  Laterality: N/A;  Primary Cesarean Section    CESAREAN SECTION N/A 05/21/2015    Procedure: CESAREAN SECTION;  Surgeon: Tereso Newcomer, MD;  Location: [facility];  Service: Obstetrics;  Laterality: N/A;  Requested 05-21-15 @ 1:15p   CESAREAN SECTION N/A 02/10/2021   Procedure: Repeat CESAREAN SECTION;  Surgeon: Vick Frees, MD;  Location: [facility];  Service: Obstetrics;  Laterality: N/A;  EDD: 02/23/21   CESAREAN SECTION WITH BILATERAL TUBAL LIGATION Bilateral 06/09/2022   Procedure: Repeat CESAREAN SECTION WITH BILATERAL TUBAL LIGATION Possibly by Salpingectomy;  Surgeon: Shea Evans, MD;  Location: [facility];  Service: Obstetrics;  Laterality: Bilateral;  EDD: 06/17/22   LAPAROSCOPIC GASTRIC SLEEVE RESECTION N/A 07/10/2017   Procedure: LAPAROSCOPIC GASTRIC SLEEVE RESECTION WITH UPPER ENDO;  Surgeon: Berna Bue, MD;  Location: [facility];  Service: General;  Laterality: N/A;    OB History     Gravida  8   Para  4   Term  4   Preterm  0   AB  4   Living  4      SAB  4   IAB      Ectopic      Multiple  0   Live Births  4            Home Medications    Prior to Admission medications   Medication Sig Start Date End Date Taking? Authorizing Provider  Doxylamine-Pyridoxine 10-10 MG TBEC Take 2 tablets by mouth at bedtime as needed. 07/20/23  Yes  [place], [place], [place]  phentermine (ADIPEX-P) 37.5 MG tablet Take 37.5 mg by mouth daily before breakfast. 02/15/23  Yes [provider]  benzonatate (TESSALON) 100 MG capsule Take 1 capsule (100 mg total) by mouth 3 (three) times daily as needed for cough. 02/13/23   Zenia Resides, MD  ibuprofen (ADVIL) 800 MG tablet Take 1 tablet (800 mg total) by mouth every 8 (eight) hours as needed (pain). 02/13/23   Zenia Resides, MD    Family History Family History  Problem Relation Age of Onset   Hypertension Mother    Diabetes Mother    Asthma Mother    Hypertension Father    Diabetes Father    Asthma Father    Diabetes Maternal Grandmother    Hypertension Maternal Grandmother    Cancer  Maternal Grandmother        urethral, lung   Stroke Other     Social History Social History   Tobacco Use   Smoking status: Former    Current packs/day: 0.00    Types: Cigarettes    Quit date: 09/16/2013    Years since quitting: 9.8   Smokeless tobacco: Never   Tobacco comments:    occasional/ social smoker  Vaping Use   Vaping status: Never Used  Substance Use Topics   Alcohol use: Yes    Comment: occasionally   Drug use: Never     Allergies   Latex   Review of Systems Review of Systems Per HPI  Physical Exam Triage Vital Signs ED Triage Vitals  Encounter Vitals Group     BP 07/20/23 1458 109/70     Systolic BP Percentile --      Diastolic BP Percentile --      Pulse Rate 07/20/23 1458 74     Resp 07/20/23 1458 18     Temp 07/20/23 1458 98.6 F (37 C)     Temp Source 07/20/23 1458 Oral     SpO2 07/20/23 1458 98 %     Weight 07/20/23 1456 239 lb (108.4 kg)     Height 07/20/23 1456 5\' 6"  (1.676 m)     Head Circumference --      Peak Flow --      Pain Score 07/20/23 1456 0     Pain Loc --      Pain Education --      Exclude from Growth Chart --    No data found.  Updated Vital Signs BP 109/70 (BP Location: Left Arm)   Pulse 74   Temp 98.6 F (37 C) (Oral)   Resp 18   Ht 5\' 6"  (1.676 m)   Wt 239 lb (108.4 kg)   LMP 06/07/2023 (Exact Date)   SpO2 98%   BMI 38.58 kg/m   Visual Acuity Right Eye Distance:   Left Eye Distance:   Bilateral Distance:    Right Eye Near:   Left Eye Near:    Bilateral Near:     Physical Exam Constitutional:      General: She is not in acute distress.    Appearance: Normal appearance. She is not toxic-appearing or diaphoretic.  HENT:     Head: Normocephalic and atraumatic.     Mouth/Throat:     Mouth: Mucous membranes are moist.     Pharynx: No posterior oropharyngeal erythema.  Eyes:     Extraocular Movements: Extraocular movements intact.     Conjunctiva/sclera: Conjunctivae normal.  Cardiovascular:      Rate and Rhythm: Normal  rate and regular rhythm.     Pulses: Normal pulses.     Heart sounds: Normal heart sounds.  Pulmonary:     Effort: Pulmonary effort is normal. No respiratory distress.     Breath sounds: Normal breath sounds.  Abdominal:     General: Bowel sounds are normal. There is no distension.     Palpations: Abdomen is soft.     Tenderness: There is no abdominal tenderness.  Skin:    General: Skin is warm.  Neurological:     General: No focal deficit present.     Mental Status: She is alert and oriented to person, place, and time. Mental status is at baseline.  Psychiatric:        Mood and Affect: Mood normal.        Behavior: Behavior normal.        Thought Content: Thought content normal.        Judgment: Judgment normal.      UC Treatments / Results  Labs (all labs ordered are listed, but only abnormal results are displayed) Labs Reviewed  POCT URINALYSIS DIP (MANUAL ENTRY) - Abnormal; Notable for the following components:      Result Value   Clarity, UA cloudy (*)    Leukocytes, UA Trace (*)    All other components within normal limits  POCT URINE PREGNANCY - Abnormal; Notable for the following components:   Preg Test, Ur Positive (*)    All other components within normal limits  URINE CULTURE    EKG   Radiology No results found.  Procedures Procedures (including critical [name] time)  Medications Ordered in UC Medications - No data to display  Initial Impression / Assessment and Plan / UC Course  I have reviewed the triage vital signs and the nursing notes.  Pertinent labs & imaging results that were available during my [name] of the patient were reviewed by me and considered in my medical decision making (see chart for details).     Urine pregnancy test was positive.  Suspect this is the cause of the persistent nausea.  Given she started having vomiting and diarrhea last night, she could also have viral illness causing these new onset symptoms.   There are no signs of acute abdomen or dehydration on exam so do not think that emergent evaluation is necessary.  Will prescribe Diclegis given this is safe in early pregnancy to help with nausea as she has been having difficulty keeping food and fluids down.  Patient reports that she has not had a tubal ligation given that she had too much scar tissue with previous C-section so there is no concern for tubal pregnancy at this time.  Although, patient was given strict ER precautions if symptoms persist or worsen and to follow-up with OB/GYN as soon as possible.  She denies that she takes any daily medications which is reassuring.  Advised her to start taking prenatal vitamin.  She also denies that she does any type of illicit drugs or smokes cigarettes.  Patient reports that she has had 9 previous pregnancies with 4 living children.  Will send urine culture given trace leukocytes to ensure UTI is not contributing as well.  She has had multiple miscarriages.  Advised strict return and ER precautions.  Patient verbalized understanding and was agreeable with plan. Final Clinical Impressions(s) / UC Diagnoses   Final diagnoses:  Nausea vomiting and diarrhea  Positive urine pregnancy test     Discharge Instructions  Pregnancy test was positive.  Please follow-up with OB/GYN as soon as possible.     ED Prescriptions     Medication Sig Dispense Auth. Provider   Doxylamine-Pyridoxine 10-10 MG TBEC Take 2 tablets by mouth at bedtime as needed. 60 tablet Carpenter, Acie Fredrickson, [place]      PDMP not reviewed this encounter.   Gustavus Bryant, [place] 07/20/23 [phone]

## 2023-07-20 NOTE — ED Triage Notes (Signed)
"  Started with nausea about 2 wks ago about every time I ate". "Around 2 am this morning diarrhea/loose stools and vomiting started". Stools are watery loose. "My throat feels like something is stuck in it". No runny nose. No cough. "Stomach ache/pain just started as well".

## 2023-07-20 NOTE — Discharge Instructions (Signed)
Pregnancy test was positive.  Please follow-up with OB/GYN as soon as possible.

## 2023-07-22 LAB — URINE CULTURE: Culture: NO GROWTH

## 2023-08-22 LAB — HEPATITIS C ANTIBODY: HCV Ab: NEGATIVE

## 2023-08-22 LAB — OB RESULTS CONSOLE RPR: RPR: NONREACTIVE

## 2023-08-22 LAB — OB RESULTS CONSOLE HEPATITIS B SURFACE ANTIGEN: Hepatitis B Surface Ag: NEGATIVE

## 2023-08-22 LAB — OB RESULTS CONSOLE RUBELLA ANTIBODY, IGM: Rubella: IMMUNE

## 2023-08-22 LAB — OB RESULTS CONSOLE HIV ANTIBODY (ROUTINE TESTING): HIV: NONREACTIVE

## 2023-08-25 ENCOUNTER — Inpatient Hospital Stay (HOSPITAL_COMMUNITY)
Admission: AD | Admit: 2023-08-25 | Discharge: 2023-08-25 | Disposition: A | Discharge status: Home / Self Care | Payer: 59 | Attending: Obstetrics | Admitting: Obstetrics

## 2023-08-25 ENCOUNTER — Encounter (HOSPITAL_COMMUNITY): Payer: Self-pay | Admitting: *Deleted

## 2023-08-25 DIAGNOSIS — Z98891 History of uterine scar from previous surgery: Secondary | ICD-10-CM | POA: Diagnosis not present

## 2023-08-25 DIAGNOSIS — R109 Unspecified abdominal pain: Secondary | ICD-10-CM

## 2023-08-25 DIAGNOSIS — O219 Vomiting of pregnancy, unspecified: Secondary | ICD-10-CM | POA: Diagnosis not present

## 2023-08-25 DIAGNOSIS — Z9884 Bariatric surgery status: Secondary | ICD-10-CM | POA: Insufficient documentation

## 2023-08-25 DIAGNOSIS — O26899 Other specified pregnancy related conditions, unspecified trimester: Secondary | ICD-10-CM

## 2023-08-25 DIAGNOSIS — R197 Diarrhea, unspecified: Secondary | ICD-10-CM | POA: Insufficient documentation

## 2023-08-25 DIAGNOSIS — Z3A11 11 weeks gestation of pregnancy: Secondary | ICD-10-CM | POA: Diagnosis not present

## 2023-08-25 DIAGNOSIS — O26891 Other specified pregnancy related conditions, first trimester: Secondary | ICD-10-CM | POA: Diagnosis present

## 2023-08-25 HISTORY — DX: Headache, unspecified: R51.9

## 2023-08-25 HISTORY — DX: Unspecified ovarian cyst, unspecified side: N83.209

## 2023-08-25 LAB — URINALYSIS, ROUTINE W REFLEX MICROSCOPIC
Bilirubin Urine: NEGATIVE
Glucose, UA: NEGATIVE mg/dL
Hgb urine dipstick: NEGATIVE
Ketones, ur: NEGATIVE mg/dL
Leukocytes,Ua: NEGATIVE
Nitrite: NEGATIVE
Protein, ur: NEGATIVE mg/dL
Specific Gravity, Urine: 1.015 (ref 1.005–1.030)
pH: 6 (ref 5.0–8.0)

## 2023-08-25 MED ORDER — SCOPOLAMINE 1 MG/3DAYS TD PT72: 1.0000 | MEDICATED_PATCH | TRANSDERMAL | 0 refills | Status: DC

## 2023-08-25 MED ORDER — PROCHLORPERAZINE MALEATE 5 MG PO TABS: 5.0000 mg | ORAL_TABLET | Freq: Four times a day (QID) | ORAL | 0 refills | Status: DC | PRN

## 2023-08-25 MED ORDER — CYCLOBENZAPRINE HCL 5 MG PO TABS: 5.0000 mg | ORAL_TABLET | Freq: Three times a day (TID) | ORAL | 0 refills | Status: AC | PRN

## 2023-08-25 MED ORDER — CYCLOBENZAPRINE HCL 5 MG PO TABS
10.0000 mg | ORAL_TABLET | Freq: Once | ORAL | Status: AC
  Administered 2023-08-25: 10 mg via ORAL
  Filled 2023-08-25: qty 2

## 2023-08-25 MED ORDER — SCOPOLAMINE 1 MG/3DAYS TD PT72
1.0000 | MEDICATED_PATCH | Freq: Once | TRANSDERMAL | Status: DC
  Administered 2023-08-25: 1.5 mg via TRANSDERMAL
  Filled 2023-08-25: qty 1

## 2023-08-25 NOTE — MAU Note (Signed)
Crystal [name] is a 37 y.o. at [redacted]w[redacted]d here in MAU reporting: been going on since she found out she was preg.  Getting worse day by day. Feels like something is stretching and 'tearing' in there. pain is in the upper abd.  No pain in lower abd. No bleeding or discharge.  On going nausea, vomiting (once this morning).  Loose stools this morning. IUP confirmed on [place] Onset of complaint: since found out last month Pain score: 7 worse with movement Vitals:   08/25/23 0950  BP: 128/72  Pulse: 64  Resp: 18  Temp: 98.3 F (36.8 C)  SpO2: 100%     FHT:164 Lab orders placed from triage:  urine

## 2023-08-25 NOTE — Discharge Instructions (Signed)
 Safe Medications in Pregnancy   Acne: Benzoyl Peroxide Salicylic Acid  Backache/Headache: Tylenol: 2 regular strength every 4 hours OR              2 Extra strength every 6 hours  Colds/Coughs/Allergies: Benadryl (alcohol free) 25 mg every 6 hours as needed Breath right strips Claritin Cepacol throat lozenges Chloraseptic throat spray Cold-Eeze- up to three times per day Cough drops, alcohol free Flonase (by prescription only) Guaifenesin Mucinex Robitussin DM (plain only, alcohol free) Saline nasal spray/drops Sudafed (pseudoephedrine) & Actifed ** use only after [redacted] weeks gestation and if you do not have high blood pressure Tylenol Vicks Vaporub Zinc lozenges Zyrtec   Constipation: Colace Ducolax suppositories Fleet enema Glycerin suppositories Metamucil Milk of magnesia Miralax Senokot Smooth move tea  Diarrhea: Kaopectate Imodium A-D  *NO pepto Bismol  Hemorrhoids: Anusol Anusol HC Preparation H Tucks  Indigestion: Tums Maalox Mylanta Cimetidine (Tagamet HB)** preferred in pregnancy Famotidine (Pepcid) Ranitidine (Zantac)  Insomnia: Benadryl (alcohol free) 25mg  every 6 hours as needed Tylenol PM Unisom, no Gelcaps  Leg Cramps: Tums MagGel  Nausea/Vomiting:  Bonine Dramamine Emetrol Ginger extract Sea bands Meclizine   Nausea medication to take during pregnancy:  Unisom (doxylamine succinate 25 mg tablets) Take one tablet daily at bedtime. If symptoms are not adequately controlled, the dose can be increased to a maximum recommended dose of two tablets daily (1/2 tablet in the morning, 1/2 tablet mid-afternoon and one at bedtime). Vitamin B6 100mg  tablets. Take one tablet twice a day (up to 200 mg per day).  Skin Rashes: Aveeno products Benadryl cream or 25mg  every 6 hours as needed Calamine Lotion 1% cortisone cream  Yeast infection: Gyne-lotrimin 7 Monistat 7   **If taking multiple medications, please check labels to avoid  duplicating the same active ingredients **take medication as directed on the label ** Do not exceed 4000 mg of tylenol in 24 hours **Do not take medications that contain aspirin or ibuprofen   FOR NAUSEA AND VOMITING: You can buy Unisom (Doxylamine) 25mg  tablets and Vitamin B6 (usually sold in 100mg  tablets) daily -- take half a tablet of Unisom (12.5mg  dose) and a tablet of Vitamin B6 every night. This would be in place of Diclegis if Diclegis is not covered by your insurance.  **The Unisom may make you drowsy! Please do not take this if you are going to be operating heavy machinery**

## 2023-08-25 NOTE — MAU Provider Note (Signed)
History     CSN: [id number]  Arrival date and time: 08/25/23 4098   Event Date/Time   First Provider Initiated Contact with Patient 08/25/2023  9:24 AM   Chief Complaint  Patient presents with   Abdominal Pain    HPI  MARJANI PISTORIUS is a 37 y.o. [id number] at [redacted]w[redacted]d who presents to the MAU for abdominal pain. Reports a midline "tearing" and pulling sensation. Worse with movement, better w rest. Ongoing throughout pregnancy thus far, stable in terms of severity. Not worse w eating certain foods. Mild diarrhea, but improving. Endorses nausea/vomiting, has been Rx'ed Zofran, but not helpful for pt. No fevers, chills, constipation, urinary sxs, vaginal bleeding. Hx C/S x 4 and gastric sleeve. Per last op note from 05/2022, had extensive abdominal adhesions, unable to perform BTL due to extensive adhesions.   Past Medical History:  Diagnosis Date   Anemia    Anxiety    Depression    H/O varicella    Headache    History of gestational hypertension    2013--- PIH   Hx of chlamydia infection    Ovarian cyst    PCOS (polycystic ovarian syndrome)    No radiological confirmation   S/P gastric bypass 07/10/2017   Wears glasses     Past Surgical History:  Procedure Laterality Date   CESAREAN SECTION  08/18/2012   Procedure: CESAREAN SECTION;x 1  Surgeon: Tresa Endo A. Ernestina Penna, MD;  Location: [facility];  Service: Obstetrics;  Laterality: N/A;  Primary Cesarean Section    CESAREAN SECTION N/A 05/21/2015   Procedure: CESAREAN SECTION;  Surgeon: Tereso Newcomer, MD;  Location: [facility];  Service: Obstetrics;  Laterality: N/A;  Requested 05-21-15 @ 1:15p   CESAREAN SECTION N/A 02/10/2021   Procedure: Repeat CESAREAN SECTION;  Surgeon: Vick Frees, MD;  Location: [facility];  Service: Obstetrics;  Laterality: N/A;  EDD: 02/23/21   CESAREAN SECTION WITH BILATERAL TUBAL LIGATION Bilateral 06/09/2022   Procedure: Repeat CESAREAN SECTION WITH BILATERAL TUBAL LIGATION Possibly by Salpingectomy;  Surgeon:  Shea Evans, MD;  Location: [facility];  Service: Obstetrics;  Laterality: Bilateral;  EDD: 06/17/22; pt states was unable to do tubal due to scar tissue   LAPAROSCOPIC GASTRIC SLEEVE RESECTION N/A 07/10/2017   Procedure: LAPAROSCOPIC GASTRIC SLEEVE RESECTION WITH UPPER ENDO;  Surgeon: Berna Bue, MD;  Location: [facility];  Service: General;  Laterality: N/A;    Family History  Problem Relation Age of Onset   Other Mother        unknown hx   Blindness Father    Stroke Father    Diabetes Maternal Grandmother    Hypertension Maternal Grandmother    Cancer Maternal Grandmother        urethral, lung   Stroke Other     Social History   Tobacco Use   Smoking status: Former    Current packs/day: 0.00    Types: Cigarettes    Quit date: 09/16/2013    Years since quitting: 9.9   Smokeless tobacco: Never   Tobacco comments:    occasional/ social smoker  Vaping Use   Vaping status: Never Used  Substance Use Topics   Alcohol use: Not Currently    Comment: occasionally   Drug use: Never    Allergies:  Allergies  Allergen Reactions   Latex Itching    No medications prior to admission.    ROS reviewed and pertinent positives and negatives as documented in HPI.  Physical Exam   Blood  pressure 120/68, pulse 60, temperature 98.3 F (36.8 C), temperature source Oral, resp. rate 18, height 5\' 7"  (1.702 m), weight 116.6 kg, last menstrual period 06/07/2023, SpO2 100%, unknown if currently breastfeeding.  Physical Exam Constitutional:      General: She is not in acute distress.    Appearance: Normal appearance. She is not ill-appearing.  HENT:     Head: Normocephalic and atraumatic.  Cardiovascular:     Rate and Rhythm: Normal rate.  Pulmonary:     Effort: Pulmonary effort is normal.     Breath sounds: Normal breath sounds.  Abdominal:     Palpations: Abdomen is soft.     Tenderness: There is no abdominal tenderness. There is no guarding.     Hernia: No hernia is  present.  Musculoskeletal:        General: Normal range of motion.  Skin:    General: Skin is warm and dry.     Findings: No rash.  Neurological:     General: No focal deficit present.     Mental Status: She is alert and oriented to person, place, and time.     MAU Course  Procedures  MDM 37 y.o. [id number] at [redacted]w[redacted]d presenting for abdominal pain most likely MSK in etiology, given worse with movement, no change based on dietary choices. Does have nausea of early preg. Hx severe adhesive disease noted on op note from C/S last year. Pt well appearing, no hernias felt on my exam. Will tx with Flexeril and tx nausea w Scopolamine patch. Pt reported feeling better with the above measures. Will discharge. Given return precautions.   Assessment and Plan  Abdominal pain affecting pregnancy - Plan: Discharge patient Likely secondary to adhesive disease/suspect rectus muscle adherent to abd wall causing pt's symptoms No evidence of hernia on exam Sxs improved w flexeril, Rx'ed on d/c N/v treated w Scopolamine here, Rx for Compazine sent on d/c  Sundra Aland, MD OB Fellow, Faculty Practice [facility], Center for [facility]  08/25/2023, 2:06 PM

## 2023-09-28 ENCOUNTER — Other Ambulatory Visit: Payer: Self-pay | Admitting: Obstetrics & Gynecology

## 2023-09-28 DIAGNOSIS — O99012 Anemia complicating pregnancy, second trimester: Secondary | ICD-10-CM

## 2023-10-02 ENCOUNTER — Encounter (HOSPITAL_COMMUNITY): Payer: 59

## 2023-10-03 ENCOUNTER — Encounter (HOSPITAL_COMMUNITY): Payer: Self-pay

## 2023-10-10 ENCOUNTER — Ambulatory Visit (HOSPITAL_COMMUNITY)
Admission: RE | Admit: 2023-10-10 | Discharge: 2023-10-10 | Disposition: A | Discharge status: Home / Self Care | Payer: Medicaid Other | Source: Ambulatory Visit | Attending: Internal Medicine | Admitting: Internal Medicine

## 2023-10-10 DIAGNOSIS — O99012 Anemia complicating pregnancy, second trimester: Secondary | ICD-10-CM | POA: Diagnosis present

## 2023-10-10 DIAGNOSIS — Z3A00 Weeks of gestation of pregnancy not specified: Secondary | ICD-10-CM | POA: Insufficient documentation

## 2023-10-10 MED ORDER — SODIUM CHLORIDE 0.9 % IV SOLN: INTRAVENOUS | Status: DC | PRN

## 2023-10-10 MED ORDER — DIPHENHYDRAMINE HCL 25 MG PO CAPS
25.0000 mg | ORAL_CAPSULE | ORAL | Status: DC
Start: 2023-10-10 — End: 2023-11-07
  Administered 2023-10-10: 25 mg via ORAL
  Filled 2023-10-10: qty 1

## 2023-10-10 MED ORDER — IRON SUCROSE 500 MG IVPB - SIMPLE MED
500.0000 mg | INTRAVENOUS | Status: DC
Start: 2023-10-10 — End: 2023-10-11
  Administered 2023-10-10: 500 mg via INTRAVENOUS
  Filled 2023-10-10: qty 500

## 2023-10-10 MED ORDER — ACETAMINOPHEN 500 MG PO TABS
500.0000 mg | ORAL_TABLET | ORAL | Status: DC
Start: 2023-10-10 — End: 2023-11-07
  Administered 2023-10-10: 500 mg via ORAL
  Filled 2023-10-10: qty 1

## 2023-10-10 NOTE — Progress Notes (Signed)
PATIENT CARE CENTER NOTE  Diagnosis: Anemia complicating pregnancy in second trimester [O99.012]    Provider: Shea Evans, MD   Procedure: Venofer 500 mg (dose #1 of 2)  Note: Patient received Venofer 500 mg infusion via PIV. Pt pre-medicated per orders with PO Tylenol and Benadryl. Pt tolerated infusion with no adverse reaction. During infusion pt states that arm that is infusing Venofer is sore. Infusion paused, and PIV flushes without resistance,is not painful when flushed, and blood return noted. No swelling or redness noted. Pt denies any other symptoms. Heat pack offered to pt by RN. Infusion restarted, and pt tolerated remainder of infusion with no issues. Vital signs are stable. AVS printed and given to pt. Pt advised that per orders she should return in 2 weeks for second dose, and she can schedule her next infusion at the front desk. Pt is alert, oriented, and ambulatory at discharge.

## 2023-10-25 ENCOUNTER — Non-Acute Institutional Stay (HOSPITAL_COMMUNITY): Payer: Medicaid Other

## 2023-10-31 ENCOUNTER — Non-Acute Institutional Stay (HOSPITAL_COMMUNITY): Payer: Medicaid Other

## 2023-11-14 ENCOUNTER — Non-Acute Institutional Stay (HOSPITAL_COMMUNITY)
Admission: RE | Admit: 2023-11-14 | Discharge: 2023-11-14 | Disposition: A | Discharge status: Home / Self Care | Payer: Medicaid Other | Source: Ambulatory Visit | Attending: Internal Medicine | Admitting: Internal Medicine

## 2023-11-14 DIAGNOSIS — O99012 Anemia complicating pregnancy, second trimester: Secondary | ICD-10-CM | POA: Insufficient documentation

## 2023-11-14 MED ORDER — IRON SUCROSE 500 MG IVPB - SIMPLE MED
500.0000 mg | Freq: Once | INTRAVENOUS | Status: AC
  Administered 2023-11-14: 500 mg via INTRAVENOUS
  Filled 2023-11-14: qty 500

## 2023-11-14 MED ORDER — DIPHENHYDRAMINE HCL 25 MG PO CAPS
25.0000 mg | ORAL_CAPSULE | Freq: Once | ORAL | Status: AC
  Administered 2023-11-14: 25 mg via ORAL
  Filled 2023-11-14: qty 1

## 2023-11-14 MED ORDER — SODIUM CHLORIDE 0.9 % IV SOLN: INTRAVENOUS | Status: DC | PRN

## 2023-11-14 MED ORDER — ACETAMINOPHEN 500 MG PO TABS
500.0000 mg | ORAL_TABLET | Freq: Once | ORAL | Status: AC
  Administered 2023-11-14: 500 mg via ORAL
  Filled 2023-11-14: qty 1

## 2023-11-14 NOTE — Progress Notes (Signed)
 PATIENT CARE CENTER NOTE  Diagnosis: Anemia complicating pregnancy in second trimester [O99.012]    Provider: Shea Evans, MD   Procedure: Venofer 500 mg (dose #2 of 2)   Note: Patient received Venofer 500 mg      infusion (dose #2 of 2) via PIV.  Per order pt to return 2 weeks after first infusion. Pt received first dose on 10/10/2023. RN called providers office, and per Helmut Muster ok for pt to receive second dose at this time. Pt pre-medicated per orders with PO Tylenol and Benadryl. Patient tolerated infusion well with no adverse reaction. AVS offered, but pt declined. Pt is alert, oriented and ambulatory at discharge.

## 2024-01-22 ENCOUNTER — Other Ambulatory Visit: Payer: Self-pay | Admitting: Obstetrics & Gynecology

## 2024-02-18 ENCOUNTER — Encounter (HOSPITAL_COMMUNITY): Payer: Self-pay

## 2024-02-18 NOTE — Patient Instructions (Signed)
 Crystal [name]  02/18/2024   Your procedure is scheduled on:  6.19.2025  Arrive at 0530 at Entrance C on [address] at [facility]  and [employer]. You are invited to use the FREE valet parking or use the Visitor's parking deck.  Pick up the phone at the desk and dial [phone].  Call this number if you have problems the morning of surgery: [phone]  Remember:   Do not eat food:(After Midnight) Desps de medianoche.  You may drink clear liquids until  __0330___.  Clear liquids means a liquid you can see thru.  It can have color such as Cola or Kool aid.  Tea is OK and coffee as long as no milk or creamer of any kind.  Take these medicines the morning of surgery with A SIP OF WATER :  none   Do not wear jewelry, make-up or nail polish.  Do not wear lotions, powders, or perfumes. Do not wear deodorant.  Do not shave 48 hours prior to surgery.  Do not bring valuables to the hospital.  [facility] is not   responsible for any belongings or valuables brought to the hospital.  Contacts, dentures or bridgework may not be worn into surgery.  Leave suitcase in the car. After surgery it may be brought to your room.  For patients admitted to the hospital, checkout time is 11:00 AM the day of              discharge.      Please read over the following fact sheets that you were given:     Preparing for Surgery

## 2024-02-19 ENCOUNTER — Encounter (HOSPITAL_COMMUNITY): Payer: Self-pay

## 2024-02-23 ENCOUNTER — Encounter (HOSPITAL_COMMUNITY): Payer: Self-pay | Admitting: Obstetrics and Gynecology

## 2024-02-23 ENCOUNTER — Inpatient Hospital Stay (HOSPITAL_COMMUNITY)
Admission: AD | Admit: 2024-02-23 | Discharge: 2024-02-23 | Disposition: A | Discharge status: Home / Self Care | Attending: Obstetrics and Gynecology | Admitting: Obstetrics and Gynecology

## 2024-02-23 DIAGNOSIS — Z3A37 37 weeks gestation of pregnancy: Secondary | ICD-10-CM

## 2024-02-23 DIAGNOSIS — O471 False labor at or after 37 completed weeks of gestation: Secondary | ICD-10-CM | POA: Diagnosis present

## 2024-02-23 DIAGNOSIS — O09523 Supervision of elderly multigravida, third trimester: Secondary | ICD-10-CM | POA: Insufficient documentation

## 2024-02-23 DIAGNOSIS — O479 False labor, unspecified: Secondary | ICD-10-CM | POA: Diagnosis not present

## 2024-02-23 NOTE — MAU Note (Signed)
 Pt says UC's strong since 1030pm-  Pain- 7/10 [facility] - called them Friday am - told to come in - but UC's decreased - so she waited.  Has C/S sch on 02-28-2024 VE - closed- 2 weeks ago

## 2024-02-23 NOTE — MAU Note (Signed)
  MAU Labor Triage Note:  .Crystal [name] is a 38 y.o. at [redacted]w[redacted]d here in MAU reporting:  I have communicated with Arnett Lanius, MD and Cooleen, CNM and reviewed vital signs:  Vitals:   02/23/24 0030 02/23/24 0102  BP: (!) 120/45 123/62  Pulse: 86 79  Resp: 14   Temp: 99 F (37.2 C)     Vaginal exam:  Dilation: 1 Effacement (%): 50 Cervical Position: Posterior Presentation: Undeterminable Exam by:: Kyleena Scheirer RN,   Also reviewed contraction pattern and that non-stress test is reactive.  It has been documented that patient is contracting every irregular minutes with no cervical change over 1 hour not indicating active labor.  Upon reassessment patient reports contractions have decreased and is not in pain anymore. Patient reports her OB Dr. Ricky Charter had mentioned that she should come in and not go home because she does not want her to go into labor d/t this being her 5th c-section. Patient reports she would be agreeable to go home since her contractions have subsided but just wants her OB to be okay with it.  This RN called Dr. Arnett Lanius and explained situation. Dr. Arnett Lanius is okay with discharge and Cooleen CNM called for discharge order. Patient denies any other complaints.  Based on this report provider has given order for discharge.  A discharge order and diagnosis entered by a provider.   Labor discharge instructions reviewed with patient.

## 2024-02-23 NOTE — MAU Provider Note (Signed)
 Crystal [name] is a 38 y.o. [phone] female at [redacted]w[redacted]d  RN Labor check, not seen by provider SVE by RN: Dilation: 1 Effacement (%): 50 Exam by:: Alondra Twitty RN NST: FHR baseline 120 bpm, Variability: moderate, Accelerations:present, Decelerations:  Absent= Cat 1/Reactive Toco: irregular, every 3-5 minutes  D/C home  Cherlynn Cornfield CNM, WHNP-BC 02/23/2024 6:50 AM

## 2024-02-26 ENCOUNTER — Encounter (HOSPITAL_COMMUNITY)
Admission: RE | Admit: 2024-02-26 | Discharge: 2024-02-26 | Disposition: A | Discharge status: Home / Self Care | Source: Ambulatory Visit | Attending: Obstetrics & Gynecology | Admitting: Obstetrics & Gynecology

## 2024-02-26 DIAGNOSIS — Z01812 Encounter for preprocedural laboratory examination: Secondary | ICD-10-CM | POA: Insufficient documentation

## 2024-02-26 LAB — TYPE AND SCREEN
ABO/RH(D): B POS
Antibody Screen: NEGATIVE

## 2024-02-26 LAB — CBC
HCT: 34.8 % — ABNORMAL LOW (ref 36.0–46.0)
Hemoglobin: 10.7 g/dL — ABNORMAL LOW (ref 12.0–15.0)
MCH: 24.3 pg — ABNORMAL LOW (ref 26.0–34.0)
MCHC: 30.7 g/dL (ref 30.0–36.0)
MCV: 79.1 fL — ABNORMAL LOW (ref 80.0–100.0)
Platelets: 225 10*3/uL (ref 150–400)
RBC: 4.4 MIL/uL (ref 3.87–5.11)
RDW: 15.3 % (ref 11.5–15.5)
WBC: 8.7 10*3/uL (ref 4.0–10.5)
nRBC: 0 % (ref 0.0–0.2)

## 2024-02-26 LAB — RPR: RPR Ser Ql: NONREACTIVE

## 2024-02-27 NOTE — H&P (Addendum)
 NATALEE TOMKIEWICZ is a 38 y.o. female 7 wks, presenting for 5th repeat C/section and tubal sterilization, possible salpingectomy.  38 wk delivery due to higher order C?section and adhesions at last c/section, attempting delivery before 39 wks to avoid labor and emergency c-sectiion   + Fms. No LOF. No VB,  [facility], AMA, maternal obesity BMI>40, C/s x 4 (adhesions at last C/section prohibited from doing salpingectomy), h/o gastric sleeve, h/o Gest HTN, taking bbASA,    . OB History     Gravida  9   Para  4   Term  4   Preterm  0   AB  4   Living  4      SAB  4   IAB      Ectopic      Multiple  0   Live Births  4          Past Medical History:  Diagnosis Date   Anemia    Anxiety    Depression    H/O varicella    Headache    History of gestational hypertension    2013--- PIH   Hx of chlamydia infection    Ovarian cyst    PCOS (polycystic ovarian syndrome)    No radiological confirmation   S/P gastric bypass 07/10/2017   Wears glasses    Past Surgical History:  Procedure Laterality Date   CESAREAN SECTION  08/18/2012   Procedure: CESAREAN SECTION;x 1  Surgeon: Burnard A. Kandyce, MD;  Location: [facility];  Service: Obstetrics;  Laterality: N/A;  Primary Cesarean Section    CESAREAN SECTION N/A 05/21/2015   Procedure: CESAREAN SECTION;  Surgeon: Gloris DELENA Hugger, MD;  Location: [facility];  Service: Obstetrics;  Laterality: N/A;  Requested 05-21-15 @ 1:15p   CESAREAN SECTION N/A 02/10/2021   Procedure: Repeat CESAREAN SECTION;  Surgeon: Linnell Devere BRAVO, MD;  Location: [facility];  Service: Obstetrics;  Laterality: N/A;  EDD: 02/23/21   CESAREAN SECTION WITH BILATERAL TUBAL LIGATION Bilateral 06/09/2022   Procedure: Repeat CESAREAN SECTION WITH BILATERAL TUBAL LIGATION Possibly by Salpingectomy;  Surgeon: Barbette Knock, MD;  Location: [facility];  Service: Obstetrics;  Laterality: Bilateral;  EDD: 06/17/22; pt states was unable to do tubal due to scar tissue    LAPAROSCOPIC GASTRIC SLEEVE RESECTION N/A 07/10/2017   Procedure: LAPAROSCOPIC GASTRIC SLEEVE RESECTION WITH UPPER ENDO;  Surgeon: Signe Mitzie DELENA, MD;  Location: [facility];  Service: General;  Laterality: N/A;   Family History: family history includes Blindness in her father; Cancer in her maternal grandmother; Diabetes in her maternal grandmother; Hypertension in her maternal grandmother; Other in her mother; Stroke in her father and another family member. Social History:  reports that she quit smoking about 10 years ago. Her smoking use included cigarettes. She has never used smokeless tobacco. She reports that she does not currently use alcohol. She reports that she does not use drugs.     Maternal Diabetes: No Genetic Screening: Normal Maternal Ultrasounds/Referrals: Normal Fetal Ultrasounds or other Referrals:  None Maternal Substance Abuse:  No Significant Maternal Medications:  Meds include: Other:  Acyclovir for Shingles on buttock Significant Maternal Lab Results:  Group B Strep negative Number of Prenatal Visits:greater than 3 verified prenatal visits Maternal Vaccinations:TDap Other Comments:  None  Review of Systems History   Last menstrual period 06/07/2023, unknown if currently breastfeeding. Exam Physical Exam  BP 118/60 (BP Location: Right Arm)   Pulse 81   Temp 98.5 F (36.9  C) (Oral)   Resp 20   Ht 5' 7 (1.702 m)   Wt (!) 140.2 kg   LMP 06/07/2023 (Exact Date)   BMI 48.41 kg/m   A&O x 3, no acute distress. Pleasant HEENT neg, no thyromegaly Lungs CTA bilat CV RRR, S1S2 normal Abdo soft, non tender, non acute Extr no edema/ tenderness Pelvic  cx closed long FHT  140s  Toco none  Prenatal labs: ABO, Rh: --/--/B POS (06/17 0910) Antibody: NEG (06/17 0910) Rubella: Immune (12/11 0000) RPR: NON REACTIVE (06/17 0903)  HBsAg: Negative (12/11 0000)  Hep C neg HIV: Non-reactive (12/11 0000)  GBS:    neg NIPT LR  AFP1 neg  GLucola nl     Assessment/Plan: 38 yo [id number], 38 wks for  5th repeat C/section and tubal sterilization by complete or partial salpingectomy.  Risks/complications of surgery reviewed incl infection, bleeding, damage to internal organs including bladder, bowels, ureters, blood vessels, other risks from anesthesia, VTE and delayed complications of any surgery, complications in future surgery reviewed. Also discussed neonatal complications incl difficult delivery, laceration, vacuum assistance, TTN etc. Pt understands and agrees, all concerns addressed.    Robbi JONELLE Render MD

## 2024-02-28 ENCOUNTER — Inpatient Hospital Stay (HOSPITAL_COMMUNITY)

## 2024-02-28 ENCOUNTER — Encounter (HOSPITAL_COMMUNITY)
Admission: AD | Disposition: A | Discharge status: Home / Self Care | Payer: Self-pay | Source: Home / Self Care | Attending: Obstetrics & Gynecology

## 2024-02-28 ENCOUNTER — Other Ambulatory Visit: Payer: Self-pay

## 2024-02-28 ENCOUNTER — Inpatient Hospital Stay (HOSPITAL_COMMUNITY)
Admission: AD | Admit: 2024-02-28 | Discharge: 2024-03-01 | DRG: 784 | Disposition: A | Discharge status: Home / Self Care | Attending: Obstetrics & Gynecology | Admitting: Obstetrics & Gynecology

## 2024-02-28 ENCOUNTER — Encounter (HOSPITAL_COMMUNITY): Payer: Self-pay | Admitting: Obstetrics & Gynecology

## 2024-02-28 DIAGNOSIS — O9902 Anemia complicating childbirth: Secondary | ICD-10-CM | POA: Diagnosis not present

## 2024-02-28 DIAGNOSIS — Z833 Family history of diabetes mellitus: Secondary | ICD-10-CM

## 2024-02-28 DIAGNOSIS — D62 Acute posthemorrhagic anemia: Secondary | ICD-10-CM | POA: Diagnosis not present

## 2024-02-28 DIAGNOSIS — O34219 Maternal care for unspecified type scar from previous cesarean delivery: Secondary | ICD-10-CM | POA: Diagnosis not present

## 2024-02-28 DIAGNOSIS — O34211 Maternal care for low transverse scar from previous cesarean delivery: Secondary | ICD-10-CM | POA: Diagnosis present

## 2024-02-28 DIAGNOSIS — Z3A00 Weeks of gestation of pregnancy not specified: Secondary | ICD-10-CM | POA: Diagnosis not present

## 2024-02-28 DIAGNOSIS — Z3A38 38 weeks gestation of pregnancy: Secondary | ICD-10-CM | POA: Diagnosis not present

## 2024-02-28 DIAGNOSIS — Z8249 Family history of ischemic heart disease and other diseases of the circulatory system: Secondary | ICD-10-CM

## 2024-02-28 DIAGNOSIS — O99214 Obesity complicating childbirth: Secondary | ICD-10-CM | POA: Diagnosis present

## 2024-02-28 DIAGNOSIS — O99844 Bariatric surgery status complicating childbirth: Secondary | ICD-10-CM | POA: Diagnosis present

## 2024-02-28 DIAGNOSIS — Z302 Encounter for sterilization: Secondary | ICD-10-CM

## 2024-02-28 DIAGNOSIS — Z87891 Personal history of nicotine dependence: Secondary | ICD-10-CM | POA: Diagnosis not present

## 2024-02-28 DIAGNOSIS — Z98891 History of uterine scar from previous surgery: Secondary | ICD-10-CM

## 2024-02-28 SURGERY — Surgical Case
Anesthesia: Spinal

## 2024-02-28 MED ORDER — KETOROLAC TROMETHAMINE 30 MG/ML IJ SOLN: 30.0000 mg | Freq: Once | INTRAMUSCULAR | Status: AC

## 2024-02-28 MED ORDER — KETOROLAC TROMETHAMINE 30 MG/ML IJ SOLN
INTRAMUSCULAR | Status: AC
  Filled 2024-02-28: qty 1

## 2024-02-28 MED ORDER — STERILE WATER FOR IRRIGATION IR SOLN
Status: DC | PRN
  Administered 2024-02-28: 1000 mL

## 2024-02-28 MED ORDER — BUPIVACAINE IN DEXTROSE 0.75-8.25 % IT SOLN
INTRATHECAL | Status: DC | PRN
  Administered 2024-02-28: 1.6 mL via INTRATHECAL

## 2024-02-28 MED ORDER — TRANEXAMIC ACID-NACL 1000-0.7 MG/100ML-% IV SOLN
INTRAVENOUS | Status: DC | PRN
  Administered 2024-02-28: 1000 mg via INTRAVENOUS

## 2024-02-28 MED ORDER — OXYTOCIN-SODIUM CHLORIDE 30-0.9 UT/500ML-% IV SOLN
INTRAVENOUS | Status: AC
Start: 2024-02-28 — End: 2024-02-28
  Filled 2024-02-28: qty 500

## 2024-02-28 MED ORDER — ZOLPIDEM TARTRATE 5 MG PO TABS: 5.0000 mg | ORAL_TABLET | Freq: Every evening | ORAL | Status: DC | PRN

## 2024-02-28 MED ORDER — COCONUT OIL OIL: 1.0000 | TOPICAL_OIL | Status: DC | PRN

## 2024-02-28 MED ORDER — LIDOCAINE-EPINEPHRINE (PF) 2 %-1:200000 IJ SOLN
INTRAMUSCULAR | Status: AC
  Filled 2024-02-28: qty 20

## 2024-02-28 MED ORDER — PHENYLEPHRINE HCL-NACL 20-0.9 MG/250ML-% IV SOLN
INTRAVENOUS | Status: DC | PRN
  Administered 2024-02-28: 40 ug/min via INTRAVENOUS
  Administered 2024-02-28: 60 ug/min via INTRAVENOUS

## 2024-02-28 MED ORDER — BUPIVACAINE IN DEXTROSE 0.75-8.25 % IT SOLN
INTRATHECAL | Status: AC
  Filled 2024-02-28: qty 2

## 2024-02-28 MED ORDER — POVIDONE-IODINE 10 % EX SWAB: 2.0000 | Freq: Once | CUTANEOUS | Status: AC

## 2024-02-28 MED ORDER — SIMETHICONE 80 MG PO CHEW
80.0000 mg | CHEWABLE_TABLET | ORAL | Status: DC | PRN
Start: 2024-02-28 — End: 2024-03-01

## 2024-02-28 MED ORDER — MORPHINE SULFATE (PF) 0.5 MG/ML IJ SOLN
INTRAMUSCULAR | Status: AC
  Filled 2024-02-28: qty 10

## 2024-02-28 MED ORDER — SODIUM BICARBONATE 8.4 % IV SOLN
INTRAVENOUS | Status: AC
Start: 2024-02-28 — End: 2024-02-28
  Filled 2024-02-28: qty 50

## 2024-02-28 MED ORDER — LACTATED RINGERS IV SOLN: INTRAVENOUS | Status: DC

## 2024-02-28 MED ORDER — FENTANYL CITRATE (PF) 100 MCG/2ML IJ SOLN: 25.0000 ug | INTRAMUSCULAR | Status: DC | PRN

## 2024-02-28 MED ORDER — DEXAMETHASONE SODIUM PHOSPHATE 10 MG/ML IJ SOLN
INTRAMUSCULAR | Status: DC | PRN
  Administered 2024-02-28: 10 mg via INTRAVENOUS

## 2024-02-28 MED ORDER — DIBUCAINE (PERIANAL) 1 % EX OINT: 1.0000 | TOPICAL_OINTMENT | CUTANEOUS | Status: DC | PRN

## 2024-02-28 MED ORDER — FENTANYL CITRATE (PF) 100 MCG/2ML IJ SOLN
INTRAMUSCULAR | Status: AC
  Filled 2024-02-28: qty 2

## 2024-02-28 MED ORDER — SODIUM CHLORIDE 0.9 % IV SOLN
500.0000 mg | Freq: Once | INTRAVENOUS | Status: AC
  Administered 2024-02-28: 500 mg via INTRAVENOUS
  Filled 2024-02-28: qty 25

## 2024-02-28 MED ORDER — TRANEXAMIC ACID-NACL 1000-0.7 MG/100ML-% IV SOLN
INTRAVENOUS | Status: AC
  Filled 2024-02-28: qty 100

## 2024-02-28 MED ORDER — SENNOSIDES-DOCUSATE SODIUM 8.6-50 MG PO TABS
2.0000 | ORAL_TABLET | Freq: Every day | ORAL | Status: DC
  Filled 2024-02-28: qty 2

## 2024-02-28 MED ORDER — ACETAMINOPHEN 10 MG/ML IV SOLN
INTRAVENOUS | Status: AC
  Filled 2024-02-28: qty 100

## 2024-02-28 MED ORDER — SIMETHICONE 80 MG PO CHEW
80.0000 mg | CHEWABLE_TABLET | Freq: Three times a day (TID) | ORAL | Status: DC
  Administered 2024-02-29 – 2024-03-01 (×5): 80 mg via ORAL
  Filled 2024-02-28 (×5): qty 1

## 2024-02-28 MED ORDER — NALOXONE HCL 0.4 MG/ML IJ SOLN: 0.4000 mg | INTRAMUSCULAR | Status: DC | PRN

## 2024-02-28 MED ORDER — DROPERIDOL 2.5 MG/ML IJ SOLN: 0.6250 mg | Freq: Once | INTRAMUSCULAR | Status: DC | PRN

## 2024-02-28 MED ORDER — CEFAZOLIN SODIUM-DEXTROSE 3-4 GM/150ML-% IV SOLN
3.0000 g | INTRAVENOUS | Status: AC
  Administered 2024-02-28: 3 g via INTRAVENOUS

## 2024-02-28 MED ORDER — PRENATAL MULTIVITAMIN CH
1.0000 | ORAL_TABLET | Freq: Every day | ORAL | Status: DC
  Administered 2024-02-29 – 2024-03-01 (×2): 1 via ORAL
  Filled 2024-02-28 (×2): qty 1

## 2024-02-28 MED ORDER — ACETAMINOPHEN 500 MG PO TABS
1000.0000 mg | ORAL_TABLET | Freq: Four times a day (QID) | ORAL | Status: DC
  Administered 2024-02-28: 1000 mg via ORAL
  Filled 2024-02-28: qty 2

## 2024-02-28 MED ORDER — IRON SUCROSE 500 MG IVPB - SIMPLE MED
500.0000 mg | Freq: Once | INTRAVENOUS | Status: DC
  Filled 2024-02-28: qty 275

## 2024-02-28 MED ORDER — ACETAMINOPHEN 10 MG/ML IV SOLN
INTRAVENOUS | Status: DC | PRN
  Administered 2024-02-28: 1000 mg via INTRAVENOUS

## 2024-02-28 MED ORDER — IBUPROFEN 600 MG PO TABS
600.0000 mg | ORAL_TABLET | Freq: Four times a day (QID) | ORAL | Status: DC
  Administered 2024-02-29 – 2024-03-01 (×5): 600 mg via ORAL
  Filled 2024-02-28 (×5): qty 1

## 2024-02-28 MED ORDER — MORPHINE SULFATE (PF) 0.5 MG/ML IJ SOLN
INTRAMUSCULAR | Status: DC | PRN
  Administered 2024-02-28: 150 ug via INTRATHECAL

## 2024-02-28 MED ORDER — ONDANSETRON HCL 4 MG/2ML IJ SOLN: 4.0000 mg | Freq: Three times a day (TID) | INTRAMUSCULAR | Status: DC | PRN

## 2024-02-28 MED ORDER — NALOXONE HCL 4 MG/10ML IJ SOLN: 1.0000 ug/kg/h | INTRAVENOUS | Status: DC | PRN

## 2024-02-28 MED ORDER — SODIUM CHLORIDE 0.9% FLUSH: 3.0000 mL | INTRAVENOUS | Status: DC | PRN

## 2024-02-28 MED ORDER — FENTANYL CITRATE (PF) 100 MCG/2ML IJ SOLN
INTRAMUSCULAR | Status: DC | PRN
  Administered 2024-02-28: 15 ug via INTRATHECAL

## 2024-02-28 MED ORDER — ONDANSETRON HCL 4 MG/2ML IJ SOLN
INTRAMUSCULAR | Status: DC | PRN
  Administered 2024-02-28: 4 mg via INTRAVENOUS

## 2024-02-28 MED ORDER — SODIUM BICARBONATE 8.4 % IV SOLN
INTRAVENOUS | Status: DC | PRN
  Administered 2024-02-28: 5 mL via EPIDURAL

## 2024-02-28 MED ORDER — DIPHENHYDRAMINE HCL 25 MG PO CAPS
25.0000 mg | ORAL_CAPSULE | ORAL | Status: AC | PRN
Start: 2024-02-28 — End: ?
  Filled 2024-02-28: qty 1

## 2024-02-28 MED ORDER — KETOROLAC TROMETHAMINE 30 MG/ML IJ SOLN: 30.0000 mg | Freq: Four times a day (QID) | INTRAMUSCULAR | Status: DC | PRN

## 2024-02-28 MED ORDER — ACETAMINOPHEN 500 MG PO TABS
1000.0000 mg | ORAL_TABLET | Freq: Four times a day (QID) | ORAL | Status: DC
  Administered 2024-02-29 – 2024-03-01 (×6): 1000 mg via ORAL
  Filled 2024-02-28 (×8): qty 2

## 2024-02-28 MED ORDER — DEXAMETHASONE SODIUM PHOSPHATE 10 MG/ML IJ SOLN
INTRAMUSCULAR | Status: AC
  Filled 2024-02-28: qty 1

## 2024-02-28 MED ORDER — MENTHOL 3 MG MT LOZG: 1.0000 | LOZENGE | OROMUCOSAL | Status: DC | PRN

## 2024-02-28 MED ORDER — OXYTOCIN-SODIUM CHLORIDE 30-0.9 UT/500ML-% IV SOLN: 2.5000 [IU]/h | INTRAVENOUS | Status: AC

## 2024-02-28 MED ORDER — OXYTOCIN-SODIUM CHLORIDE 30-0.9 UT/500ML-% IV SOLN
INTRAVENOUS | Status: DC | PRN
  Administered 2024-02-28: 300 mL via INTRAVENOUS

## 2024-02-28 MED ORDER — SENNOSIDES-DOCUSATE SODIUM 8.6-50 MG PO TABS
2.0000 | ORAL_TABLET | Freq: Every day | ORAL | Status: DC
  Administered 2024-02-29 – 2024-03-01 (×2): 2 via ORAL
  Filled 2024-02-28 (×2): qty 2

## 2024-02-28 MED ORDER — LIDOCAINE-EPINEPHRINE (PF) 2 %-1:200000 IJ SOLN
INTRAMUSCULAR | Status: DC | PRN
  Administered 2024-02-28: 3 mL via EPIDURAL

## 2024-02-28 MED ORDER — WITCH HAZEL-GLYCERIN EX PADS: 1.0000 | MEDICATED_PAD | CUTANEOUS | Status: DC | PRN

## 2024-02-28 MED ORDER — ONDANSETRON HCL 4 MG/2ML IJ SOLN
INTRAMUSCULAR | Status: AC
  Filled 2024-02-28: qty 2

## 2024-02-28 MED ORDER — KETOROLAC TROMETHAMINE 30 MG/ML IJ SOLN
30.0000 mg | Freq: Four times a day (QID) | INTRAMUSCULAR | Status: AC
  Administered 2024-02-29: 30 mg via INTRAVENOUS
  Filled 2024-02-28 (×3): qty 1

## 2024-02-28 MED ORDER — DIPHENHYDRAMINE HCL 50 MG/ML IJ SOLN: 12.5000 mg | INTRAMUSCULAR | Status: DC | PRN

## 2024-02-28 MED ORDER — DIPHENHYDRAMINE HCL 25 MG PO CAPS
25.0000 mg | ORAL_CAPSULE | Freq: Four times a day (QID) | ORAL | Status: AC | PRN
Start: 2024-02-28 — End: ?

## 2024-02-28 MED ORDER — OXYCODONE HCL 5 MG PO TABS
5.0000 mg | ORAL_TABLET | ORAL | Status: DC | PRN
  Administered 2024-02-29: 5 mg via ORAL
  Filled 2024-02-28: qty 1

## 2024-02-28 SURGICAL SUPPLY — 34 items
BARRIER ADHS 3X4 INTERCEED (GAUZE/BANDAGES/DRESSINGS) IMPLANT
BENZOIN TINCTURE PRP APPL 2/3 (GAUZE/BANDAGES/DRESSINGS) IMPLANT
CHLORAPREP W/TINT 26ML (MISCELLANEOUS) ×4 IMPLANT
CLAMP UMBILICAL CORD (MISCELLANEOUS) ×1 IMPLANT
CLOTH BEACON ORANGE TIMEOUT ST (SAFETY) ×2 IMPLANT
DRSG OPSITE POSTOP 4X10 (GAUZE/BANDAGES/DRESSINGS) ×2 IMPLANT
ELECTRODE REM PT RTRN 9FT ADLT (ELECTROSURGICAL) ×1 IMPLANT
EXTRACTOR VACUUM KIWI (MISCELLANEOUS) IMPLANT
EXTRACTOR VACUUM M CUP 4 TUBE (SUCTIONS) IMPLANT
GAUZE PAD ABD 7.5X8 STRL (GAUZE/BANDAGES/DRESSINGS) IMPLANT
GAUZE SPONGE 4X4 12PLY STRL (GAUZE/BANDAGES/DRESSINGS) IMPLANT
GLOVE BIO SURGEON STRL SZ7 (GLOVE) ×2 IMPLANT
GLOVE BIOGEL PI IND STRL 7.0 (GLOVE) ×6 IMPLANT
GOWN STRL REUS W/TWL LRG LVL3 (GOWN DISPOSABLE) ×6 IMPLANT
KIT ABG SYR 3ML LUER SLIP (SYRINGE) IMPLANT
LIGASURE IMPACT 36 18CM CVD LR (INSTRUMENTS) IMPLANT
MAT PREVALON FULL STRYKER (MISCELLANEOUS) IMPLANT
NDL HYPO 25X5/8 SAFETYGLIDE (NEEDLE) IMPLANT
NEEDLE HYPO 25X5/8 SAFETYGLIDE (NEEDLE) IMPLANT
NS IRRIG 1000ML POUR BTL (IV SOLUTION) ×2 IMPLANT
PACK C SECTION WH (CUSTOM PROCEDURE TRAY) ×1 IMPLANT
PAD OB MATERNITY 4.3X12.25 (PERSONAL CARE ITEMS) ×1 IMPLANT
RETRACTOR TRAXI PANNICULUS (MISCELLANEOUS) IMPLANT
RTRCTR C-SECT PINK 25CM LRG (MISCELLANEOUS) IMPLANT
STRIP CLOSURE SKIN 1/2X4 (GAUZE/BANDAGES/DRESSINGS) IMPLANT
SUT MNCRL 0 VIOLET CTX 36 (SUTURE) ×2 IMPLANT
SUT PLAIN 0 NONE (SUTURE) IMPLANT
SUT PLAIN ABS 2-0 CT1 27XMFL (SUTURE) IMPLANT
SUT VIC AB 0 CT1 27XBRD ANBCTR (SUTURE) ×4 IMPLANT
SUT VIC AB 2-0 CT1 TAPERPNT 27 (SUTURE) ×1 IMPLANT
SUT VIC AB 4-0 KS 27 (SUTURE) ×2 IMPLANT
TOWEL OR 17X24 6PK STRL BLUE (TOWEL DISPOSABLE) ×2 IMPLANT
TRAY FOLEY W/BAG SLVR 14FR LF (SET/KITS/TRAYS/PACK) IMPLANT
WATER STERILE IRR 1000ML POUR (IV SOLUTION) ×1 IMPLANT

## 2024-02-28 NOTE — Anesthesia Procedure Notes (Signed)
 Epidural Patient location during procedure: OR Start time: 02/28/2024 7:34 AM End time: 02/28/2024 7:37 AM  Staffing Anesthesiologist: Vernadine Golas, MD Performed: anesthesiologist   Preanesthetic Checklist Completed: patient identified, IV checked, risks and benefits discussed, monitors and equipment checked, pre-op evaluation and timeout performed  Epidural Patient position: sitting Prep: DuraPrep and site prepped and draped Patient monitoring: continuous pulse ox, heart rate and blood pressure Approach: midline Location: L3-L4 Injection technique: LOR air  Needle:  Needle type: Tuohy  Needle gauge: 17 G Needle length: 9 cm Needle insertion depth: 8 cm Catheter type: closed end flexible Catheter size: 19 Gauge Catheter at skin depth: 13 cm  Assessment Sensory level: T4 Events: blood not aspirated, injection not painful, no injection resistance, no paresthesia and negative IV test  Additional Notes Patient identified. Risks, benefits, and alternatives discussed with patient including but not limited to bleeding, infection, nerve damage, paralysis, failed block, headache, blood pressure changes, nausea, vomiting, reactions to medication, itching, and postpartum back pain. Confirmed the patient's most recent platelet count. Confirmed with patient that they are not currently taking any anticoagulation, have any bleeding history, or any family history of bleeding disorders. Patient expressed understanding and wished to proceed. All questions were answered. Sterile technique was used throughout the entire procedure.    Crisp LOR with Tuohy needle on first pass. Whitacre 25g spinal needle introduced through Tuohy with clear CSF return prior to injection of intrathecal medication. Spinal needle withdrawn and epidural catheter threaded easily. Negative aspiration of catheter for heme or CSF.

## 2024-02-28 NOTE — Anesthesia Preprocedure Evaluation (Addendum)
 Anesthesia Evaluation  Patient identified by MRN, date of birth, ID band Patient awake    Reviewed: Allergy & Precautions, NPO status , Patient's Chart, lab work & pertinent test results  History of Anesthesia Complications Negative for: history of anesthetic complications  Airway Mallampati: II  TM Distance: >3 FB Neck ROM: Full    Dental  (+) Missing,    Pulmonary former smoker   Pulmonary exam normal        Cardiovascular negative cardio ROS Normal cardiovascular exam     Neuro/Psych  Headaches  Anxiety Depression       GI/Hepatic negative GI ROS, Neg liver ROS,,,  Endo/Other  negative endocrine ROS    Renal/GU negative Renal ROS  negative genitourinary   Musculoskeletal negative musculoskeletal ROS (+)    Abdominal   Peds  Hematology  (+) Blood dyscrasia (Hgb 10.7), anemia   Anesthesia Other Findings Day of surgery medications reviewed with patient.  Reproductive/Obstetrics (+) Pregnancy (Hx of C/S x4)                             Anesthesia Physical Anesthesia Plan  ASA: 3  Anesthesia Plan: Combined Spinal and Epidural   Post-op Pain Management: Ofirmev  IV (intra-op)*   Induction:   PONV Risk Score and Plan: 4 or greater and Treatment may vary due to age or medical condition, Ondansetron  and Dexamethasone   Airway Management Planned: Natural Airway  Additional Equipment: None  Intra-op Plan:   Post-operative Plan:   Informed Consent: I have reviewed the patients History and Physical, chart, labs and discussed the procedure including the risks, benefits and alternatives for the proposed anesthesia with the patient or authorized representative who has indicated his/her understanding and acceptance.       Plan Discussed with: CRNA  Anesthesia Plan Comments:         Anesthesia Quick Evaluation

## 2024-02-28 NOTE — Anesthesia Postprocedure Evaluation (Signed)
 Anesthesia Post Note  Patient: Crystal [name]  Procedure(s) Performed: CESAREAN SECTION, WITH BILATERAL TUBAL LIGATION     Patient location during evaluation: PACU Anesthesia Type: Combined Spinal/Epidural Level of consciousness: awake and alert Pain management: pain level controlled Vital Signs Assessment: post-procedure vital signs reviewed and stable Respiratory status: spontaneous breathing, nonlabored ventilation and respiratory function stable Cardiovascular status: blood pressure returned to baseline Postop Assessment: no apparent nausea or vomiting, no backache, no headache, spinal receding and patient able to bend at knees Anesthetic complications: no   No notable events documented.  Last Vitals:  Vitals:   02/28/24 1030 02/28/24 1037  BP: 130/80 132/78  Pulse: 61 65  Resp: 12 12  Temp: 36.9 C 37 C  SpO2: 98% 98%    Last Pain:  Vitals:   02/28/24 1037  TempSrc: Oral  PainSc: 4    Pain Goal:    LLE Motor Response: Purposeful movement (02/28/24 1030)   RLE Motor Response: Purposeful movement (02/28/24 1030) RLE Sensation: No tingling (02/28/24 1030)     Epidural/Spinal Function Cutaneous sensation: Able to Discern Pressure (02/28/24 1030), Patient able to flex knees: Yes (02/28/24 1030), Patient able to lift hips off bed: No (02/28/24 1030), Back pain beyond tenderness at insertion site: No (02/28/24 1030), Progressively worsening motor and/or sensory loss: No (02/28/24 1030), Bowel and/or bladder incontinence post epidural: No (02/28/24 1030)  Rayfield Cairo

## 2024-02-28 NOTE — Transfer of Care (Signed)
 Immediate Anesthesia Transfer of Care Note  Patient: Crystal [name]  Procedure(s) Performed: CESAREAN SECTION, WITH BILATERAL TUBAL LIGATION  Patient Location: PACU  Anesthesia Type:Spinal and Epidural  Level of Consciousness: awake, alert , and oriented  Airway & Oxygen Therapy: Patient Spontanous Breathing  Post-op Assessment: Report given to RN and Post -op Vital signs reviewed and stable  Post vital signs: Reviewed and stable  Last Vitals:  Vitals Value Taken Time  BP 140/84 02/28/24 09:27  Temp 36.4 C 02/28/24 09:28  Pulse 68 02/28/24 09:30  Resp 13 02/28/24 09:30  SpO2 99 % 02/28/24 09:30  Vitals shown include unfiled device data.  Last Pain:  Vitals:   02/28/24 0626  TempSrc:   PainSc: 5          Complications: No notable events documented.

## 2024-02-28 NOTE — Op Note (Signed)
 Cesarean Section Procedure Note   Crystal [name]  02/28/2024  Repeat elective Cesarean section and bilateral salpingectomy for sterilization  Indications: Scheduled Proceedure/Maternal Request for prior C/sections x4 and permanent female sterilization requested   Pre-operative Diagnosis: Previous Cesarean Section x 4, Dense Adhesions, Elective Sterilization.   Post-operative Diagnosis: Same   Surgeon:  Terri Fester, MD   Assistants: Warren Haber CNM   Anesthesia: combined epidural and spinal  (CSE due to anticipated longer surgery)   Procedure Details:  The patient was seen in the Holding Room. The risks, benefits, complications, treatment options, and expected outcomes were discussed with the patient. The patient concurred with the proposed plan, giving informed consent. identified as Crystal [name] and the procedure verified as C-Section Delivery. A Time Out was held and the above information confirmed.  3 gm Ancef  and 1000 mg TXA given. She had 1 gm Tylenol  before.  After induction of anesthesia, the patient was draped and prepped in the usual sterile manner, foley was draining urine well.  A pfannenstiel incision was made and carried down through the subcutaneous tissue to the fascia. Fascia densely adherent to underlying muscles. Fascial incision was made and extended transversely. The fascia was separated from the underlying rectus tissue superiorly and inferiorly. The peritoneum was identified and entered. Peritoneal incision was extended longitudinally. Alexis- O retractor was not placed due to concern for limited space. Bladder blade placed. The utero-vesical peritoneal reflection was incised transversely and the bladder flap was bluntly freed from the lower uterine segment. Lower segment was very thin. A low transverse uterine incision was made and curved up with scissors. Amniotic fluid was copious and clear. Delivered from cephalic presentation was a FEMALE infant with vigorous cry.  Apgar scores of 9 at one minute and 9 at five minutes. Delayed cord clamping done at 1 minute and baby handed to NICU team in attendance. Cord ph was not sent. Cord blood was obtained for evaluation.  At this point, Alexis-O retractor placed. The placenta was removed Intact and appeared normal. The uterine outline, tubes and ovaries appeared normal. The uterine incision was closed with running locked sutures of 0 Monocryl in single layer. Hemostasis was observed.  Fallopian tubes were not edematous this time and there was space in mesosalpinx in between tube and mesosalpinx vessels to perform salpingectomy. So bilateral salpingectomy performed using LigaSure. Hemostasis noted.  Interceed placed on bladder dissection.  Alexis retractor removed. Omental adhesions from midline released with Bovie after assessing there were no bowel adhesions. Peritoneal closure done with 2-0 Vicryl.  The fascia was then reapproximated with running sutures of 0Vicryl. The subcuticular closure was performed using 2-0plain gut. The skin was closed with 4-0Vicryl. Steristrips, honeycomb, pressure dressing placed.   Instrument, sponge, and needle counts were correct prior the abdominal closure and were correct at the conclusion of the case.   Findings: dense fascial adhesions and bladder adhesions. Low segment very thin (paper thin) but no dehiscence noted. Baby delivered cephalic and Apgars 9 and 9. Bladder adhesions taken down further to close hysterotomy and Interceed placed. Bilateral Salpingectomy was possible this time    Estimated Blood Loss: 754 cc   Total IV Fluids: 16109 ml LR   Urine Output: 150 cc clear in foley   Specimens: cord blood. Both fallopian tubes   Complications: no complications  Disposition: PACU - hemodynamically stable.   Maternal Condition: stable   Baby condition / location:  Couplet care / Skin to Skin  Attending Attestation: I performed the  procedure.   Signed: Surgeon(s): Terri Fester, MD     =

## 2024-02-29 ENCOUNTER — Encounter (HOSPITAL_COMMUNITY): Payer: Self-pay | Admitting: Obstetrics & Gynecology

## 2024-02-29 LAB — CBC
HCT: 30.9 % — ABNORMAL LOW (ref 36.0–46.0)
Hemoglobin: 9.8 g/dL — ABNORMAL LOW (ref 12.0–15.0)
MCH: 25.1 pg — ABNORMAL LOW (ref 26.0–34.0)
MCHC: 31.7 g/dL (ref 30.0–36.0)
MCV: 79 fL — ABNORMAL LOW (ref 80.0–100.0)
Platelets: 258 10*3/uL (ref 150–400)
RBC: 3.91 MIL/uL (ref 3.87–5.11)
RDW: 15.6 % — ABNORMAL HIGH (ref 11.5–15.5)
WBC: 14.8 10*3/uL — ABNORMAL HIGH (ref 4.0–10.5)
nRBC: 0 % (ref 0.0–0.2)

## 2024-02-29 NOTE — Social Work (Signed)
 CSW received consult for hx of Anxiety and Depression.  CSW met with MOB to offer support and complete assessment.    CSW met with MOB at bedside and introduced CSW role. MOB was up in the room and her infant was asleep in bassinet. CSW congratulated MOB on baby boy, Personnel officer. MOB confirmed that her demographic information on hospital file was correct. MOB presented pleasant, calm and engaged with CSW throughout the assessment. CSW asked MOB how she had been feeling since giving birth. MOB reported that she was feeling good and shared her infant's birth went well. CSW inquired about MOB's history of anxiety and depression. MOB acknowledged having diagnoses for depression and anxiety. She shared that it had "been a while" since she was diagnosed and felt her symptoms were not triggered easily. MOB reported that large crowds and hospitals trigger her anxiety. CSW asked MOB if she was experiencing anxiety symptoms since she was in a hospital. MOB reported having some mild anxiety in the operating room during her c-section but stated she was able to calm herself down and denied having current symptoms. CSW acknowledged MOB's coping strategies. CSW inquired if MOB had any concerns with anxiety/depression during the pregnancy. MOB denied having concerns and stated that her emotions were stable. CSW asked MOB if she experienced postpartum depression/anxiety after the birth of her older children. MOB denied having postpartum depression/anxiety symptoms and stated she was knowledgeable of the symptoms and allowed CSW to review them with her.  CSW provided education regarding the baby blues period vs. perinatal mood disorders, discussed treatment and gave resources for mental health follow up if concerns arise.  CSW recommended MOB complete a self-evaluation during the postpartum time period using the New Mom Checklist from Postpartum Progress and encouraged MOB to contact a medical professional if symptoms are noted at any  time.  CSW assessed MOB for safety. MOB denied having thoughts of harming herself and others.  CSW inquired if MOB had essential item to care for her infant. MOB confirmed she had essential items including a car seat and crib. CSW provided review of Sudden Infant Death Syndrome (SIDS) precautions. CSW inquired if MOB had selected a pediatrician. MOB reported [facility].  CSW identifies no further need for intervention and no barriers to discharge at this time.  Nickolas Barr, MSW, LCSW Clinical Social Worker  [phone] 02/29/2024  11:44 AM

## 2024-02-29 NOTE — Progress Notes (Signed)
 Subjective: Postpartum Day 1: Cesarean Delivery Patient reports tolerating PO, + flatus, + BM and no problems voiding.    Objective: Vital signs in last 24 hours: Temp:  [98 F (36.7 C)-98.9 F (37.2 C)] 98 F (36.7 C) (06/20 1218) Pulse Rate:  [70-80] 80 (06/20 1218) Resp:  [16-18] 16 (06/20 1218) BP: (106-117)/(60-71) 106/71 (06/20 1218) SpO2:  [97 %-100 %] 97 % (06/20 1218)  Physical Exam:  General: alert Lochia: appropriate Uterine Fundus: firm Incision: healing well, no significant drainage, no dehiscence DVT Evaluation: No evidence of DVT seen on physical exam.  Recent Labs    02/29/24 0500  HGB 9.8*  HCT 30.9*    Assessment/Plan: Status post Cesarean section. Doing well postoperatively.  Continue current care.  Crystal [name] Isabell A Tulio Facundo 02/29/2024, 4:14 PM

## 2024-03-01 MED ORDER — IBUPROFEN 600 MG PO TABS: 600.0000 mg | ORAL_TABLET | Freq: Four times a day (QID) | ORAL | 0 refills | Status: AC

## 2024-03-01 MED ORDER — ACETAMINOPHEN 500 MG PO TABS: 1000.0000 mg | ORAL_TABLET | Freq: Four times a day (QID) | ORAL | 0 refills | Status: AC

## 2024-03-01 MED ORDER — OXYCODONE HCL 5 MG PO TABS: 5.0000 mg | ORAL_TABLET | ORAL | 0 refills | Status: AC | PRN

## 2024-03-01 NOTE — Discharge Summary (Signed)
 Postpartum Discharge Summary  Date of Service updated6/21/25     Patient Name: Crystal [name] DOB: 04/19/1986 MRN: [id number]  Date of admission: 02/28/2024 Delivery date:02/28/2024 Delivering provider: MODY, VAISHALI Date of discharge: 03/01/2024  Admitting diagnosis: Previous cesarean section [Z98.891] Status post repeat low transverse cesarean section [Z98.891] Intrauterine pregnancy: [redacted]w[redacted]d     Secondary diagnosis:  Principal Problem:   Postpartum care following cesarean delivery 6/19 Active Problems:   Morbid obesity (HCC)   Cesarean delivery delivered - repeat   Previous cesarean section   Status post repeat low transverse cesarean section  Additional problems:     Discharge diagnosis: Term Pregnancy Delivered and B salpingectomy                                              Post partum procedures:  Augmentation: N/A Complications: None  Hospital course: Sceduled C/S   38 y.o. yo [id number] at [redacted]w[redacted]d was admitted to the hospital 02/28/2024 for scheduled cesarean section with the following indication:Elective Repeat.Delivery details are as follows:  Membrane Rupture Time/Date: 8:18 AM,02/28/2024  Delivery Method:C-Section, Low Transverse Operative Delivery:N/A Details of operation can be found in separate operative note.  Patient had a postpartum course complicated by.  She is ambulating, tolerating a regular diet, passing flatus, and urinating well. Patient is discharged home in stable condition on  03/01/24        Newborn Data: Birth date:02/28/2024 Birth time:8:19 AM Gender:Female Living status:Living Apgars:9 ,9  Weight:3650 g    Magnesium  Sulfate received: No BMZ received: No Rhophylac:No MMR:No T-DaP:  Flu: No RSV Vaccine received: No Transfusion:No Immunizations administered: Immunization History  Administered Date(s) Administered   Influenza,inj,Quad PF,6+ Mos 05/22/2015   Tdap 03/03/2015    Physical exam  Vitals:   02/29/24 1753 02/29/24 2138 03/01/24  0552 03/01/24 1354  BP: (!) 122/57 125/60 (!) 112/55 122/65  Pulse: 87 80 78 84  Resp: 16 16 16 16   Temp: 98 F (36.7 C) 98.2 F (36.8 C) 98.3 F (36.8 C) 98.2 F (36.8 C)  TempSrc: Oral Oral Oral Oral  SpO2: 100% 100% 100% 96%  Weight:      Height:       General: alert Lochia: appropriate Uterine Fundus: firm Incision: Healing well with no significant drainage DVT Evaluation: No evidence of DVT seen on physical exam. Labs: Lab Results  Component Value Date   WBC 14.8 (H) 02/29/2024   HGB 9.8 (L) 02/29/2024   HCT 30.9 (L) 02/29/2024   MCV 79.0 (L) 02/29/2024   PLT 258 02/29/2024      Latest Ref Rng & Units 05/16/2020   10:08 AM  CMP  Glucose 70 - 99 mg/dL 93   BUN 6 - 20 mg/dL 8   Creatinine 9.55 - 8.99 mg/dL 9.12   Sodium 864 - 854 mmol/L 138   Potassium 3.5 - 5.1 mmol/L 4.0   Chloride 98 - 111 mmol/L 108   CO2 22 - 32 mmol/L 24   Calcium 8.9 - 10.3 mg/dL 8.9   Total Protein 6.5 - 8.1 g/dL 6.6   Total Bilirubin 0.3 - 1.2 mg/dL 0.6   Alkaline Phos 38 - 126 U/L 35   AST 15 - 41 U/L 21   ALT 0 - 44 U/L 16    Edinburgh Score:    02/28/2024   10:45 AM  Edinburgh Postnatal Depression Scale  Screening Tool  I have been able to laugh and see the funny side of things. 0  I have looked forward with enjoyment to things. 0  I have blamed myself unnecessarily when things went wrong. 0  I have been anxious or worried for no good reason. 0  I have felt scared or panicky for no good reason. 0  Things have been getting on top of me. 0  I have been so unhappy that I have had difficulty sleeping. 0  I have felt sad or miserable. 0  I have been so unhappy that I have been crying. 0  The thought of harming myself has occurred to me. 0  Edinburgh Postnatal Depression Scale Total 0      After visit meds:  Allergies as of 03/01/2024       Reactions   Latex Itching        Medication List     TAKE these medications    acetaminophen  500 MG tablet Commonly known as:  TYLENOL  Take 2 tablets (1,000 mg total) by mouth every 6 (six) hours.   ibuprofen  600 MG tablet Commonly known as: ADVIL  Take 1 tablet (600 mg total) by mouth every 6 (six) hours.   multivitamin-prenatal 27-0.8 MG Tabs tablet Take 1 tablet by mouth daily at 12 noon.   oxyCODONE  5 MG immediate release tablet Commonly known as: Oxy IR/ROXICODONE  Take 1-2 tablets (5-10 mg total) by mouth every 4 (four) hours as needed for moderate pain (pain score 4-6).         Discharge home in stable condition Infant Feeding: Breast Infant Disposition:home with mother Discharge instruction: per After Visit Summary and Postpartum booklet. Activity: Advance as tolerated. Pelvic rest for 6 weeks.  Diet: routine diet Anticipated Birth Control: b salpingectomy Postpartum Appointment:6 weeks Additional Postpartum F/U:   Future Appointments:No future appointments. Follow up Visit:      03/01/2024 Ovid DELENA All, MD

## 2024-03-03 LAB — SURGICAL PATHOLOGY

## 2024-03-08 ENCOUNTER — Telehealth (HOSPITAL_COMMUNITY): Payer: Self-pay

## 2024-03-08 NOTE — Telephone Encounter (Signed)
 03/08/2024 1915  Name: Crystal [name] MRN: [id number] DOB: 1985-10-03  Reason for Call:  Transition of Care Hospital Discharge Call  Contact Status: Patient Contact Status: Complete  Language assistant needed:          Follow-Up Questions: Do You Have Any Concerns About Your Health As You Heal From Delivery?: No Do You Have Any Concerns About Your Infants Health?: No  Edinburgh Postnatal Depression Scale:  In the Past 7 Days: I have been able to laugh and see the funny side of things.: As much as I always could I have looked forward with enjoyment to things.: As much as I ever did I have blamed myself unnecessarily when things went wrong.: No, never I have been anxious or worried for no good reason.: No, not at all I have felt scared or panicky for no good reason.: No, not at all Things have been getting on top of me.: No, I have been coping as well as ever I have been so unhappy that I have had difficulty sleeping.: Not at all I have felt sad or miserable.: No, not at all I have been so unhappy that I have been crying.: No, never The thought of harming myself has occurred to me.: Never [place] Postnatal Depression Scale Total: 0  PHQ2-9 Depression Scale:     Discharge Follow-up: Edinburgh score requires follow up?: No Patient was advised of the following resources:: Breastfeeding Support Group, Support Group  Post-discharge interventions: Reviewed Newborn Safe Sleep Practices  Signature  Rosaline Deretha PEAK

## 2024-08-11 ENCOUNTER — Ambulatory Visit (INDEPENDENT_AMBULATORY_CARE_PROVIDER_SITE_OTHER)

## 2024-08-11 ENCOUNTER — Ambulatory Visit: Admission: EM | Admit: 2024-08-11 | Discharge: 2024-08-11 | Disposition: A | Discharge status: Home / Self Care

## 2024-08-11 ENCOUNTER — Encounter: Payer: Self-pay | Admitting: Emergency Medicine

## 2024-08-11 DIAGNOSIS — G8929 Other chronic pain: Secondary | ICD-10-CM | POA: Diagnosis not present

## 2024-08-11 DIAGNOSIS — S93491A Sprain of other ligament of right ankle, initial encounter: Secondary | ICD-10-CM | POA: Diagnosis not present

## 2024-08-11 DIAGNOSIS — M25571 Pain in right ankle and joints of right foot: Secondary | ICD-10-CM

## 2024-08-11 DIAGNOSIS — M545 Low back pain, unspecified: Secondary | ICD-10-CM | POA: Diagnosis not present

## 2024-08-11 DIAGNOSIS — F418 Other specified anxiety disorders: Secondary | ICD-10-CM | POA: Insufficient documentation

## 2024-08-11 MED ORDER — KETOROLAC TROMETHAMINE 30 MG/ML IJ SOLN
30.0000 mg | Freq: Once | INTRAMUSCULAR | Status: AC
  Administered 2024-08-11: 30 mg via INTRAMUSCULAR

## 2024-08-11 MED ORDER — NAPROXEN 375 MG PO TABS: 375.0000 mg | ORAL_TABLET | Freq: Two times a day (BID) | ORAL | 0 refills | Status: AC

## 2024-08-11 NOTE — ED Provider Notes (Signed)
 [facility]    CSN: [id number] Arrival date & time: 08/11/24  1133      History   Chief Complaint Chief Complaint  Patient presents with   Back Pain   Ankle Pain    HPI Crystal [name] is a 38 y.o. female.   Patient presents to clinic over concern of midline low back pain that has been ongoing for years but recently seems to have been flared up.  She is also having right ankle pain.  Reports at work she rolled her right ankle, was able to finish her shift but noticed this morning when she got to work she was having a lot of pain so she decided to come into clinic.  Denies inner leg numbness or incontinence.  Will occasionally have pain radiate down either of her legs.  Thinks the back pain started after spinal injections from previous deliveries.  Has not any recent trauma or falls.  Noticed when she went to bend down yesterday that she had pain when trying to stand back up in her low back.  Has not tried medications or interventions.  Reports rolling her ankle yesterday at work.  The area was swollen yesterday, seems to be better today.  She is ambulatory.  Has not tried medications or interventions prior to arrival.  Denies previous injuries to the ankle.  The history is provided by the patient and medical records.  Back Pain Ankle Pain   Past Medical History:  Diagnosis Date   Anemia    Anxiety    Depression    H/O varicella    Headache    History of gestational hypertension    2013--- PIH   Hx of chlamydia infection    Ovarian cyst    PCOS (polycystic ovarian syndrome)    No radiological confirmation   S/P gastric bypass 07/10/2017   Wears glasses     Patient Active Problem List   Diagnosis Date Noted   Mixed anxiety and depressive disorder 08/11/2024   Previous cesarean section 02/28/2024   Status post repeat low transverse cesarean section 02/28/2024   Encounter for medical examination to establish care 09/13/2022   Supervision provided for high  risk pregnancy with history of previous cesarean delivery 03/09/2022   History of gestational hypertension 11/28/2021   Multigravida of advanced maternal age 23/20/2023   Herpes zoster 11/03/2021   Cesarean delivery delivered - repeat 02/11/2021   History of cesarean section 02/10/2021   Previous bariatric surgery complicating pregnancy 09/12/2020   History of bariatric surgery 11/23/2017   Morbid obesity (HCC) 07/10/2017   Anemia of pregnancy 05/21/2015   Obesity affecting pregnancy, antepartum 03/26/2015   Pregnancy 12/09/2014   Postpartum care following cesarean delivery 6/19 08/19/2012    Past Surgical History:  Procedure Laterality Date   CESAREAN SECTION  08/18/2012   Procedure: CESAREAN SECTION;x 1  Surgeon: Burnard A. Kandyce, MD;  Location: [facility];  Service: Obstetrics;  Laterality: N/A;  Primary Cesarean Section    CESAREAN SECTION N/A 05/21/2015   Procedure: CESAREAN SECTION;  Surgeon: Gloris DELENA Hugger, MD;  Location: [facility];  Service: Obstetrics;  Laterality: N/A;  Requested 05-21-15 @ 1:15p   CESAREAN SECTION N/A 02/10/2021   Procedure: Repeat CESAREAN SECTION;  Surgeon: Linnell Devere BRAVO, MD;  Location: [facility];  Service: Obstetrics;  Laterality: N/A;  EDD: 02/23/21   CESAREAN SECTION WITH BILATERAL TUBAL LIGATION Bilateral 06/09/2022   Procedure: Repeat CESAREAN SECTION WITH BILATERAL TUBAL LIGATION Possibly by Salpingectomy;  Surgeon: Mody,  Robbi, MD;  Location: [facility];  Service: Obstetrics;  Laterality: Bilateral;  EDD: 06/17/22; pt states was unable to do tubal due to scar tissue   CESAREAN SECTION WITH BILATERAL TUBAL LIGATION N/A 02/28/2024   Procedure: CESAREAN SECTION, WITH BILATERAL TUBAL LIGATION;  Surgeon: Barbette Robbi, MD;  Location: [facility];  Service: Obstetrics;  Laterality: N/A;  REPEAT C/S, BILATERAL TUBALIGATION BY SALPINGECTOMY EDD: 03/13/24   LAPAROSCOPIC GASTRIC SLEEVE RESECTION N/A 07/10/2017   Procedure: LAPAROSCOPIC GASTRIC SLEEVE RESECTION WITH  UPPER ENDO;  Surgeon: Signe Mitzie LABOR, MD;  Location: [facility];  Service: General;  Laterality: N/A;    OB History     Gravida  9   Para  5   Term  5   Preterm  0   AB  4   Living  5      SAB  4   IAB      Ectopic      Multiple  0   Live Births  5            Home Medications    Prior to Admission medications   Medication Sig Start Date End Date Taking? Authorizing Provider  naproxen  (NAPROSYN ) 375 MG tablet Take 1 tablet (375 mg total) by mouth 2 (two) times daily. 08/11/24  Yes Petula Rotolo  N, FNP  Prenatal Vit-Fe Fumarate-FA (MULTIVITAMIN-PRENATAL) 27-0.8 MG TABS tablet Take 1 tablet by mouth daily at 12 noon.   Yes [provider]  acetaminophen  (TYLENOL ) 500 MG tablet Take 2 tablets (1,000 mg total) by mouth every 6 (six) hours. 03/01/24   Dillard, Naima, MD  Butalbital-APAP-Caffeine 50-325-40 MG capsule Take 1 capsule by mouth as directed. Patient not taking: Reported on 08/11/2024 03/07/24   [provider]  furosemide (LASIX) 20 MG tablet Take 20 mg by mouth daily. Patient not taking: Reported on 08/11/2024 03/04/24   [provider]  ibuprofen  (ADVIL ) 600 MG tablet Take 1 tablet (600 mg total) by mouth every 6 (six) hours. 03/01/24   Armond Cape, MD  oxyCODONE  (OXY IR/ROXICODONE ) 5 MG immediate release tablet Take 1-2 tablets (5-10 mg total) by mouth every 4 (four) hours as needed for moderate pain (pain score 4-6). Patient not taking: Reported on 08/11/2024 03/01/24   Armond Cape, MD    Family History Family History  Problem Relation Age of Onset   Other Mother        unknown hx   Blindness Father    Stroke Father    Diabetes Maternal Grandmother    Hypertension Maternal Grandmother    Cancer Maternal Grandmother        urethral, lung   Stroke Other     Social History Social History   Tobacco Use   Smoking status: Former    Current packs/day: 0.00    Types: Cigarettes    Quit date: 09/16/2013    Years since  quitting: 10.9    Passive exposure: Past   Smokeless tobacco: Never   Tobacco comments:    occasional/ social smoker  Vaping Use   Vaping status: Never Used  Substance Use Topics   Alcohol use: Not Currently    Comment: occasionally   Drug use: Never     Allergies   Latex   Review of Systems Review of Systems  Per HPI  Physical Exam Triage Vital Signs ED Triage Vitals  Encounter Vitals Group     BP 08/11/24 1205 121/83     Girls Systolic BP Percentile --  Girls Diastolic BP Percentile --      Boys Systolic BP Percentile --      Boys Diastolic BP Percentile --      Pulse Rate 08/11/24 1205 72     Resp 08/11/24 1205 16     Temp 08/11/24 1205 (!) 97.2 F (36.2 C)     Temp Source 08/11/24 1205 Oral     SpO2 08/11/24 1205 98 %     Weight --      Height --      Head Circumference --      Peak Flow --      Pain Score 08/11/24 1206 10     Pain Loc --      Pain Education --      Exclude from Growth Chart --    No data found.  Updated Vital Signs BP 121/83 (BP Location: Left Arm)   Pulse 72   Temp (!) 97.2 F (36.2 C) (Oral)   Resp 16   LMP 07/06/2024 (Approximate)   SpO2 98%   Breastfeeding No   Visual Acuity Right Eye Distance:   Left Eye Distance:   Bilateral Distance:    Right Eye Near:   Left Eye Near:    Bilateral Near:     Physical Exam Vitals and nursing note reviewed.  Constitutional:      Appearance: Normal appearance.  HENT:     Head: Normocephalic and atraumatic.     Right Ear: External ear normal.     Left Ear: External ear normal.     Nose: Nose normal.     Mouth/Throat:     Mouth: Mucous membranes are moist.  Eyes:     Conjunctiva/sclera: Conjunctivae normal.  Cardiovascular:     Rate and Rhythm: Normal rate.     Pulses:          Dorsalis pedis pulses are 2+ on the right side.       Posterior tibial pulses are 2+ on the right side.  Pulmonary:     Effort: Pulmonary effort is normal. No respiratory distress.   Musculoskeletal:        General: Swelling, tenderness and signs of injury present. Normal range of motion.       Back:       Feet:  Skin:    General: Skin is warm and dry.  Neurological:     General: No focal deficit present.     Mental Status: She is alert.  Psychiatric:        Behavior: Behavior is cooperative.      UC Treatments / Results  Labs (all labs ordered are listed, but only abnormal results are displayed) Labs Reviewed - No data to display  EKG   Radiology No results found.  Procedures Procedures (including critical care time)  Medications Ordered in UC Medications  ketorolac  (TORADOL ) 30 MG/ML injection 30 mg (has no administration in time range)    Initial Impression / Assessment and Plan / UC Course  I have reviewed the triage vital signs and the nursing notes.  Pertinent labs & imaging results that were available during my care of the patient were reviewed by me and considered in my medical decision making (see chart for details).  Vitals and triage reviewed, patient is hemodynamically stable.  Right lateral ankle with tenderness to palpation and mild swelling.  Brisk capillary refill distally.  Full range of motion intact, some discomfort.  Imaging by my interpretation does not show acute bony abnormality.  Suspect sprain.  Imaging by my interpretation does not show acute bony abnormality.  Will treat with RICE and anti-inflammatories.  Midline low back pain is chronic and ongoing.  Atraumatic, imaging deferred.  Without inner leg numbness or incontinence, low concern for cauda equina.  Will trial IM Toradol , this should help with back and ankle pain.  Orthopedic follow-up encouraged.  Plan of care, follow-up care return precautions given, no questions at this time.  Work note provided.    Final Clinical Impressions(s) / UC Diagnoses   Final diagnoses:  Acute right ankle pain  Chronic midline low back pain, unspecified whether sciatica  present  Sprain of anterior talofibular ligament of right ankle, initial encounter     Discharge Instructions      I did not see any bony abnormalities on your ankle x-ray, official radiology overread will be back in the next few hours and you will be called if we need to update your treatment plan.  In the meantime you can rest, ice, and elevate your ankle to help with pain and swelling.   The Toradol  injection will help with back and ankle pain.  Starting tomorrow you can take the naproxen  as needed.  Do not take this with any ibuprofen  or Aleve , as they are the same class of medications.  Follow-up with orthopedic for any ongoing pain.      ED Prescriptions     Medication Sig Dispense Auth. Provider   naproxen  (NAPROSYN ) 375 MG tablet Take 1 tablet (375 mg total) by mouth 2 (two) times daily. 20 tablet Dreama, Beth Spackman  N, FNP      PDMP not reviewed this encounter.   Dreama, Carmen Tolliver  N, FNP 08/11/24 1348

## 2024-08-11 NOTE — Discharge Instructions (Addendum)
 I did not see any bony abnormalities on your ankle x-ray, official radiology overread will be back in the next few hours and you will be called if we need to update your treatment plan.  In the meantime you can rest, ice, and elevate your ankle to help with pain and swelling.   The Toradol  injection will help with back and ankle pain.  Starting tomorrow you can take the naproxen  as needed.  Do not take this with any ibuprofen  or Aleve , as they are the same class of medications.  Follow-up with orthopedic for any ongoing pain.

## 2024-08-11 NOTE — ED Triage Notes (Addendum)
 Pt reports shooting, sharp back pain x1 week and aching R ankle pain x1 day. Pt reports she has always had back pain after having 5 C-sections, but reports more consistent, severe pain over the last week. Pain is sharp and shooting all the way down her spine. This pain is not associated with the R ankle pain. Pt reports a fall that injured her R ankle in July 2025 - she never saw a medical provider. Pt reports rolling this R ankle yesterday and having continuous aching pain and stiffness in the joint. No medication use for symptoms. Denies ice, heat, elevation, and rest to alleviate symptoms. Pt reports the ankle is currently more bothersome. Pain is aggravated by any pressure - walking, standing, etc.

## 2024-10-09 ENCOUNTER — Encounter: Payer: Self-pay | Admitting: Emergency Medicine

## 2024-10-09 ENCOUNTER — Ambulatory Visit: Admission: EM | Admit: 2024-10-09 | Discharge: 2024-10-09 | Disposition: A | Discharge status: Home / Self Care

## 2024-10-09 DIAGNOSIS — H7491 Unspecified disorder of right middle ear and mastoid: Secondary | ICD-10-CM

## 2024-10-09 NOTE — ED Provider Notes (Signed)
 " [facility]    CSN: [id number] Arrival date & time: 10/09/24  1737      History   Chief Complaint Chief Complaint  Patient presents with   Ear Concern    HPI Crystal [name] is a 39 y.o. female.   Pt presents today due to sudden, painful, swelling of bone behind right ear today. Denies use of medicine, drainage from ear, loss of hearing, or pain in ear.   The history is provided by the patient.    Past Medical History:  Diagnosis Date   Anemia    Anxiety    Depression    H/O varicella    Headache    History of gestational hypertension    2013--- PIH   Hx of chlamydia infection    Ovarian cyst    PCOS (polycystic ovarian syndrome)    No radiological confirmation   S/P gastric bypass 07/10/2017   Wears glasses     Patient Active Problem List   Diagnosis Date Noted   Mixed anxiety and depressive disorder 08/11/2024   Previous cesarean section 02/28/2024   Status post repeat low transverse cesarean section 02/28/2024   Encounter for medical examination to establish care 09/13/2022   Supervision provided for high risk pregnancy with history of previous cesarean delivery 03/09/2022   History of gestational hypertension 11/28/2021   Multigravida of advanced maternal age 46/20/2023   Herpes zoster 11/03/2021   Cesarean delivery delivered - repeat 02/11/2021   History of cesarean section 02/10/2021   Previous bariatric surgery complicating pregnancy 09/12/2020   History of bariatric surgery 11/23/2017   Morbid obesity (HCC) 07/10/2017   Anemia of pregnancy 05/21/2015   Obesity affecting pregnancy, antepartum 03/26/2015   Pregnancy 12/09/2014   Postpartum care following cesarean delivery 6/19 08/19/2012    Past Surgical History:  Procedure Laterality Date   CESAREAN SECTION  08/18/2012   Procedure: CESAREAN SECTION;x 1  Surgeon: Burnard A. Kandyce, MD;  Location: [facility];  Service: Obstetrics;  Laterality: N/A;  Primary Cesarean Section    CESAREAN  SECTION N/A 05/21/2015   Procedure: CESAREAN SECTION;  Surgeon: Gloris DELENA Hugger, MD;  Location: [facility];  Service: Obstetrics;  Laterality: N/A;  Requested 05-21-15 @ 1:15p   CESAREAN SECTION N/A 02/10/2021   Procedure: Repeat CESAREAN SECTION;  Surgeon: Linnell Devere BRAVO, MD;  Location: [facility];  Service: Obstetrics;  Laterality: N/A;  EDD: 02/23/21   CESAREAN SECTION WITH BILATERAL TUBAL LIGATION Bilateral 06/09/2022   Procedure: Repeat CESAREAN SECTION WITH BILATERAL TUBAL LIGATION Possibly by Salpingectomy;  Surgeon: Barbette Knock, MD;  Location: [facility];  Service: Obstetrics;  Laterality: Bilateral;  EDD: 06/17/22; pt states was unable to do tubal due to scar tissue   CESAREAN SECTION WITH BILATERAL TUBAL LIGATION N/A 02/28/2024   Procedure: CESAREAN SECTION, WITH BILATERAL TUBAL LIGATION;  Surgeon: Barbette Knock, MD;  Location: [facility];  Service: Obstetrics;  Laterality: N/A;  REPEAT C/S, BILATERAL TUBALIGATION BY SALPINGECTOMY EDD: 03/13/24   LAPAROSCOPIC GASTRIC SLEEVE RESECTION N/A 07/10/2017   Procedure: LAPAROSCOPIC GASTRIC SLEEVE RESECTION WITH UPPER ENDO;  Surgeon: Signe Mitzie DELENA, MD;  Location: [facility];  Service: General;  Laterality: N/A;    OB History     Gravida  9   Para  5   Term  5   Preterm  0   AB  4   Living  5      SAB  4   IAB      Ectopic  Multiple  0   Live Births  5            Home Medications    Prior to Admission medications  Medication Sig Start Date End Date Taking? Authorizing Provider  acetaminophen  (TYLENOL ) 500 MG tablet Take 2 tablets (1,000 mg total) by mouth every 6 (six) hours. 03/01/24   Dillard, Naima, MD  Butalbital-APAP-Caffeine 50-325-40 MG capsule Take 1 capsule by mouth as directed. Patient not taking: Reported on 08/11/2024 03/07/24   [provider]  Ferrous Sulfate  (IRON  PO)     [provider]  furosemide (LASIX) 20 MG tablet Take 20 mg by mouth daily. Patient not taking: Reported on  08/11/2024 03/04/24   [provider]  ibuprofen  (ADVIL ) 600 MG tablet Take 1 tablet (600 mg total) by mouth every 6 (six) hours. 03/01/24   Armond Cape, MD  naproxen  (NAPROSYN ) 375 MG tablet Take 1 tablet (375 mg total) by mouth 2 (two) times daily. 08/11/24   Ball, [place]  G, FNP  oxyCODONE  (OXY IR/ROXICODONE ) 5 MG immediate release tablet Take 1-2 tablets (5-10 mg total) by mouth every 4 (four) hours as needed for moderate pain (pain score 4-6). Patient not taking: Reported on 08/11/2024 03/01/24   Armond Cape, MD  phentermine  (ADIPEX-P ) 37.5 MG tablet Take 37.5 mg by mouth daily.    [provider]  Prenatal Vit-Fe Fumarate-FA (MULTIVITAMIN-PRENATAL) 27-0.8 MG TABS tablet Take 1 tablet by mouth daily at 12 noon.    [provider]  WEGOVY 0.25 MG/0.5ML SOAJ SQ injection SMARTSIG:0.25 Milligram(s) SUB-Q Once a Week    [provider]    Family History Family History  Problem Relation Age of Onset   Other Mother        unknown hx   Blindness Father    Stroke Father    Diabetes Maternal Grandmother    Hypertension Maternal Grandmother    Cancer Maternal Grandmother        urethral, lung   Stroke Other     Social History Social History[1]   Allergies   Latex   Review of Systems Review of Systems   Physical Exam Triage Vital Signs ED Triage Vitals  Encounter Vitals Group     BP 10/09/24 1946 110/78     Girls Systolic BP Percentile --      Girls Diastolic BP Percentile --      Boys Systolic BP Percentile --      Boys Diastolic BP Percentile --      Pulse Rate 10/09/24 1946 69     Resp 10/09/24 1946 18     Temp 10/09/24 1946 98.1 F (36.7 C)     Temp Source 10/09/24 1946 Oral     SpO2 10/09/24 1946 98 %     Weight 10/09/24 1946 (!) 309 lb 1.4 oz (140.2 kg)     Height --      Head Circumference --      Peak Flow --      Pain Score 10/09/24 1944 6     Pain Loc --      Pain Education --      Exclude from Growth Chart --    No  data found.  Updated Vital Signs BP 110/78 (BP Location: Left Arm)   Pulse 69   Temp 98.1 F (36.7 C) (Oral)   Resp 18   Wt (!) 309 lb 1.4 oz (140.2 kg)   LMP 09/11/2024 (Exact Date)   SpO2 98%   Breastfeeding No  BMI 48.41 kg/m   Visual Acuity Right Eye Distance:   Left Eye Distance:   Bilateral Distance:    Right Eye Near:   Left Eye Near:    Bilateral Near:     Physical Exam Vitals and nursing note reviewed.  Constitutional:      General: She is not in acute distress.    Appearance: Normal appearance. She is not ill-appearing, toxic-appearing or diaphoretic.  HENT:     Right Ear: Tympanic membrane and ear canal normal. There is mastoid tenderness.     Left Ear: Tympanic membrane, ear canal and external ear normal.     Ears:     Comments: Right mastoid tenderness and moderate swelling    Mouth/Throat:     Mouth: Mucous membranes are moist.     Pharynx: Oropharynx is clear. No oropharyngeal exudate or posterior oropharyngeal erythema.  Eyes:     General: No scleral icterus. Cardiovascular:     Rate and Rhythm: Normal rate and regular rhythm.     Heart sounds: Normal heart sounds.  Pulmonary:     Effort: Pulmonary effort is normal. No respiratory distress.     Breath sounds: Normal breath sounds. No wheezing or rhonchi.  Musculoskeletal:     Cervical back: No tenderness.  Lymphadenopathy:     Cervical: No cervical adenopathy.  Skin:    General: Skin is warm.  Neurological:     Mental Status: She is alert and oriented to person, place, and time.  Psychiatric:        Mood and Affect: Mood normal.        Behavior: Behavior normal.      UC Treatments / Results  Labs (all labs ordered are listed, but only abnormal results are displayed) Labs Reviewed - No data to display  EKG   Radiology No results found.  Procedures Procedures (including critical care time)  Medications Ordered in UC Medications - No data to display  Initial Impression /  Assessment and Plan / UC Course  I have reviewed the triage vital signs and the nursing notes.  Pertinent labs & imaging results that were available during my care of the patient were reviewed by me and considered in my medical decision making (see chart for details).     Final Clinical Impressions(s) / UC Diagnoses   Final diagnoses:  Mastoid disorder, right     Discharge Instructions      There is inflammation of the bone behind your ear. We are noticing this early. We should start use with ibuprofen  every 8 hrs and use cold compress. If symptoms do not improve in 3-5 days or if you develop fever of 100.5 or above we need to follow-up in ED for imaging.      ED Prescriptions   None    PDMP not reviewed this encounter.    [1]  Social History Tobacco Use   Smoking status: Former    Current packs/day: 0.00    Types: Cigarettes    Quit date: 09/16/2013    Years since quitting: 11.0    Passive exposure: Past   Smokeless tobacco: Never   Tobacco comments:    occasional/ social smoker  Vaping Use   Vaping status: Never Used  Substance Use Topics   Alcohol use: Not Currently    Comment: occasionally   Drug use: Never     Andra Corean BROCKS, PA-C 10/09/24 2006  "

## 2024-10-09 NOTE — ED Triage Notes (Signed)
 Pt presents c/o ear concern x today. Pt states,  I have this knot behind my right ear that hurts to the touch. It just appeared around 5 the same size that it is now. I also have a slight sore throat.  Pt reports tingling in the area where the knot is located.

## 2024-10-09 NOTE — Discharge Instructions (Addendum)
 There is inflammation of the bone behind your ear. We are noticing this early. We should start use with ibuprofen  every 8 hrs and use cold compress. If symptoms do not improve in 3-5 days or if you develop fever of 100.5 or above we need to follow-up in ED for imaging.
